# Patient Record
Sex: Female | Born: 1935
Health system: Southern US, Community
[De-identification: ages and names within clinical notes are randomized; demographics above are authoritative.]

## PROBLEM LIST (undated history)

## (undated) DIAGNOSIS — E7800 Pure hypercholesterolemia, unspecified: Secondary | ICD-10-CM

## (undated) DIAGNOSIS — I4891 Unspecified atrial fibrillation: Secondary | ICD-10-CM

## (undated) DIAGNOSIS — Z972 Presence of dental prosthetic device (complete) (partial): Secondary | ICD-10-CM

## (undated) DIAGNOSIS — C801 Malignant (primary) neoplasm, unspecified: Secondary | ICD-10-CM

## (undated) DIAGNOSIS — I10 Essential (primary) hypertension: Secondary | ICD-10-CM

## (undated) DIAGNOSIS — M1990 Unspecified osteoarthritis, unspecified site: Secondary | ICD-10-CM

## (undated) DIAGNOSIS — H409 Unspecified glaucoma: Secondary | ICD-10-CM

## (undated) DIAGNOSIS — IMO0001 Reserved for inherently not codable concepts without codable children: Secondary | ICD-10-CM

## (undated) HISTORY — PX: TONSILLECTOMY: SUR1361

## (undated) HISTORY — PX: ABDOMINAL HYSTERECTOMY: SHX81

## (undated) HISTORY — PX: COLONOSCOPY: SHX174

## (undated) HISTORY — PX: CARDIAC ELECTROPHYSIOLOGY STUDY AND ABLATION: SHX1294

## (undated) HISTORY — PX: CHOLECYSTECTOMY: SHX55

---

## 2004-12-19 ENCOUNTER — Ambulatory Visit: Payer: Self-pay | Admitting: Internal Medicine

## 2005-03-22 ENCOUNTER — Ambulatory Visit: Payer: Self-pay | Admitting: Internal Medicine

## 2005-04-04 ENCOUNTER — Ambulatory Visit: Payer: Self-pay | Admitting: Internal Medicine

## 2006-03-23 ENCOUNTER — Ambulatory Visit: Payer: Self-pay | Admitting: Internal Medicine

## 2007-03-26 ENCOUNTER — Ambulatory Visit: Payer: Self-pay | Admitting: Internal Medicine

## 2008-03-27 ENCOUNTER — Ambulatory Visit: Payer: Self-pay | Admitting: Internal Medicine

## 2009-03-29 ENCOUNTER — Ambulatory Visit: Payer: Self-pay | Admitting: Internal Medicine

## 2009-11-29 ENCOUNTER — Ambulatory Visit: Payer: Self-pay | Admitting: Internal Medicine

## 2010-03-31 ENCOUNTER — Ambulatory Visit: Payer: Self-pay | Admitting: Internal Medicine

## 2010-06-09 ENCOUNTER — Ambulatory Visit: Payer: Self-pay | Admitting: Internal Medicine

## 2011-05-22 ENCOUNTER — Ambulatory Visit: Payer: Self-pay | Admitting: Internal Medicine

## 2012-05-22 ENCOUNTER — Ambulatory Visit: Payer: Self-pay | Admitting: Internal Medicine

## 2012-07-03 HISTORY — PX: CARDIAC CATHETERIZATION: SHX172

## 2012-07-26 ENCOUNTER — Ambulatory Visit: Payer: Self-pay | Admitting: Cardiology

## 2012-09-26 ENCOUNTER — Ambulatory Visit: Payer: Self-pay | Admitting: Internal Medicine

## 2013-06-16 ENCOUNTER — Ambulatory Visit: Payer: Self-pay | Admitting: Internal Medicine

## 2014-08-31 DIAGNOSIS — H4011X1 Primary open-angle glaucoma, mild stage: Secondary | ICD-10-CM | POA: Diagnosis not present

## 2014-09-04 DIAGNOSIS — I481 Persistent atrial fibrillation: Secondary | ICD-10-CM | POA: Diagnosis not present

## 2014-09-04 DIAGNOSIS — E1121 Type 2 diabetes mellitus with diabetic nephropathy: Secondary | ICD-10-CM | POA: Diagnosis not present

## 2014-09-11 DIAGNOSIS — M1712 Unilateral primary osteoarthritis, left knee: Secondary | ICD-10-CM | POA: Diagnosis not present

## 2014-09-11 DIAGNOSIS — E042 Nontoxic multinodular goiter: Secondary | ICD-10-CM | POA: Diagnosis not present

## 2014-09-11 DIAGNOSIS — I10 Essential (primary) hypertension: Secondary | ICD-10-CM | POA: Diagnosis not present

## 2014-09-11 DIAGNOSIS — I481 Persistent atrial fibrillation: Secondary | ICD-10-CM | POA: Diagnosis not present

## 2014-10-22 DIAGNOSIS — D485 Neoplasm of uncertain behavior of skin: Secondary | ICD-10-CM | POA: Diagnosis not present

## 2014-10-22 DIAGNOSIS — L821 Other seborrheic keratosis: Secondary | ICD-10-CM | POA: Diagnosis not present

## 2014-10-22 DIAGNOSIS — L570 Actinic keratosis: Secondary | ICD-10-CM | POA: Diagnosis not present

## 2014-10-22 DIAGNOSIS — C44722 Squamous cell carcinoma of skin of right lower limb, including hip: Secondary | ICD-10-CM | POA: Diagnosis not present

## 2014-10-22 DIAGNOSIS — L578 Other skin changes due to chronic exposure to nonionizing radiation: Secondary | ICD-10-CM | POA: Diagnosis not present

## 2014-11-20 DIAGNOSIS — I5022 Chronic systolic (congestive) heart failure: Secondary | ICD-10-CM | POA: Diagnosis not present

## 2014-11-20 DIAGNOSIS — I10 Essential (primary) hypertension: Secondary | ICD-10-CM | POA: Diagnosis not present

## 2014-11-20 DIAGNOSIS — I4891 Unspecified atrial fibrillation: Secondary | ICD-10-CM | POA: Diagnosis not present

## 2014-11-20 DIAGNOSIS — E1121 Type 2 diabetes mellitus with diabetic nephropathy: Secondary | ICD-10-CM | POA: Diagnosis not present

## 2014-12-08 DIAGNOSIS — I481 Persistent atrial fibrillation: Secondary | ICD-10-CM | POA: Diagnosis not present

## 2014-12-08 DIAGNOSIS — E1121 Type 2 diabetes mellitus with diabetic nephropathy: Secondary | ICD-10-CM | POA: Diagnosis not present

## 2014-12-08 DIAGNOSIS — I10 Essential (primary) hypertension: Secondary | ICD-10-CM | POA: Diagnosis not present

## 2014-12-15 DIAGNOSIS — I5022 Chronic systolic (congestive) heart failure: Secondary | ICD-10-CM | POA: Diagnosis not present

## 2014-12-15 DIAGNOSIS — I481 Persistent atrial fibrillation: Secondary | ICD-10-CM | POA: Diagnosis not present

## 2014-12-15 DIAGNOSIS — I10 Essential (primary) hypertension: Secondary | ICD-10-CM | POA: Diagnosis not present

## 2014-12-15 DIAGNOSIS — M1990 Unspecified osteoarthritis, unspecified site: Secondary | ICD-10-CM | POA: Diagnosis not present

## 2014-12-30 DIAGNOSIS — I10 Essential (primary) hypertension: Secondary | ICD-10-CM | POA: Diagnosis not present

## 2014-12-30 DIAGNOSIS — E1121 Type 2 diabetes mellitus with diabetic nephropathy: Secondary | ICD-10-CM | POA: Diagnosis not present

## 2014-12-30 DIAGNOSIS — E042 Nontoxic multinodular goiter: Secondary | ICD-10-CM | POA: Diagnosis not present

## 2014-12-30 DIAGNOSIS — I481 Persistent atrial fibrillation: Secondary | ICD-10-CM | POA: Diagnosis not present

## 2015-01-21 DIAGNOSIS — L570 Actinic keratosis: Secondary | ICD-10-CM | POA: Diagnosis not present

## 2015-01-21 DIAGNOSIS — L821 Other seborrheic keratosis: Secondary | ICD-10-CM | POA: Diagnosis not present

## 2015-01-21 DIAGNOSIS — C44722 Squamous cell carcinoma of skin of right lower limb, including hip: Secondary | ICD-10-CM | POA: Diagnosis not present

## 2015-01-21 DIAGNOSIS — D485 Neoplasm of uncertain behavior of skin: Secondary | ICD-10-CM | POA: Diagnosis not present

## 2015-01-21 DIAGNOSIS — L578 Other skin changes due to chronic exposure to nonionizing radiation: Secondary | ICD-10-CM | POA: Diagnosis not present

## 2015-01-21 DIAGNOSIS — L820 Inflamed seborrheic keratosis: Secondary | ICD-10-CM | POA: Diagnosis not present

## 2015-03-01 DIAGNOSIS — H4011X1 Primary open-angle glaucoma, mild stage: Secondary | ICD-10-CM | POA: Diagnosis not present

## 2015-03-09 DIAGNOSIS — H4011X1 Primary open-angle glaucoma, mild stage: Secondary | ICD-10-CM | POA: Diagnosis not present

## 2015-03-11 DIAGNOSIS — H2513 Age-related nuclear cataract, bilateral: Secondary | ICD-10-CM | POA: Diagnosis not present

## 2015-03-15 ENCOUNTER — Encounter: Payer: Self-pay | Admitting: *Deleted

## 2015-03-15 NOTE — Discharge Instructions (Signed)

## 2015-03-17 ENCOUNTER — Ambulatory Visit: Payer: Commercial Managed Care - HMO | Admitting: Anesthesiology

## 2015-03-17 ENCOUNTER — Ambulatory Visit
Admission: RE | Admit: 2015-03-17 | Discharge: 2015-03-17 | Disposition: A | Discharge status: Home / Self Care | Payer: Commercial Managed Care - HMO | Source: Ambulatory Visit | Attending: Ophthalmology | Admitting: Ophthalmology

## 2015-03-17 ENCOUNTER — Encounter
Admission: RE | Disposition: A | Discharge status: Home / Self Care | Payer: Self-pay | Source: Ambulatory Visit | Attending: Ophthalmology

## 2015-03-17 DIAGNOSIS — E780 Pure hypercholesterolemia: Secondary | ICD-10-CM | POA: Insufficient documentation

## 2015-03-17 DIAGNOSIS — H2513 Age-related nuclear cataract, bilateral: Secondary | ICD-10-CM | POA: Diagnosis not present

## 2015-03-17 DIAGNOSIS — Z85828 Personal history of other malignant neoplasm of skin: Secondary | ICD-10-CM | POA: Diagnosis not present

## 2015-03-17 DIAGNOSIS — I499 Cardiac arrhythmia, unspecified: Secondary | ICD-10-CM | POA: Diagnosis not present

## 2015-03-17 DIAGNOSIS — H2512 Age-related nuclear cataract, left eye: Secondary | ICD-10-CM | POA: Diagnosis not present

## 2015-03-17 DIAGNOSIS — Z9049 Acquired absence of other specified parts of digestive tract: Secondary | ICD-10-CM | POA: Insufficient documentation

## 2015-03-17 DIAGNOSIS — Z90710 Acquired absence of both cervix and uterus: Secondary | ICD-10-CM | POA: Diagnosis not present

## 2015-03-17 DIAGNOSIS — I10 Essential (primary) hypertension: Secondary | ICD-10-CM | POA: Diagnosis not present

## 2015-03-17 HISTORY — DX: Essential (primary) hypertension: I10

## 2015-03-17 HISTORY — DX: Pure hypercholesterolemia, unspecified: E78.00

## 2015-03-17 HISTORY — DX: Unspecified osteoarthritis, unspecified site: M19.90

## 2015-03-17 HISTORY — DX: Unspecified glaucoma: H40.9

## 2015-03-17 HISTORY — DX: Presence of dental prosthetic device (complete) (partial): Z97.2

## 2015-03-17 HISTORY — DX: Unspecified atrial fibrillation: I48.91

## 2015-03-17 HISTORY — PX: CATARACT EXTRACTION W/PHACO: SHX586

## 2015-03-17 SURGERY — PHACOEMULSIFICATION, CATARACT, WITH IOL INSERTION
Anesthesia: General | Laterality: Left | Wound class: Clean

## 2015-03-17 MED ORDER — PROPARACAINE HCL 0.5 % OP SOLN
1.0000 [drp] | Freq: Once | OPHTHALMIC | Status: AC
  Administered 2015-03-17: 1 [drp] via OPHTHALMIC

## 2015-03-17 MED ORDER — FENTANYL CITRATE (PF) 100 MCG/2ML IJ SOLN
INTRAMUSCULAR | Status: DC | PRN
  Administered 2015-03-17: 50 ug via INTRAVENOUS

## 2015-03-17 MED ORDER — EPINEPHRINE HCL 1 MG/ML IJ SOLN
INTRAOCULAR | Status: DC | PRN
  Administered 2015-03-17: 102 mL via OPHTHALMIC

## 2015-03-17 MED ORDER — LACTATED RINGERS IV SOLN: 500.0000 mL | INTRAVENOUS | Status: DC

## 2015-03-17 MED ORDER — BRIMONIDINE TARTRATE 0.2 % OP SOLN
OPHTHALMIC | Status: DC | PRN
  Administered 2015-03-17: 1 [drp] via OPHTHALMIC

## 2015-03-17 MED ORDER — TIMOLOL MALEATE 0.5 % OP SOLN
OPHTHALMIC | Status: DC | PRN
  Administered 2015-03-17: 1 [drp] via OPHTHALMIC

## 2015-03-17 MED ORDER — OXYCODONE HCL 5 MG/5ML PO SOLN: 5.0000 mg | Freq: Once | ORAL | Status: DC | PRN

## 2015-03-17 MED ORDER — NA HYALUR & NA CHOND-NA HYALUR 0.4-0.35 ML IO KIT
PACK | INTRAOCULAR | Status: DC | PRN
  Administered 2015-03-17: 1 mL via INTRAOCULAR

## 2015-03-17 MED ORDER — CEFUROXIME OPHTHALMIC INJECTION 1 MG/0.1 ML
INJECTION | OPHTHALMIC | Status: DC | PRN
  Administered 2015-03-17: 0.1 mL via INTRACAMERAL

## 2015-03-17 MED ORDER — LACTATED RINGERS IV SOLN: INTRAVENOUS | Status: DC

## 2015-03-17 MED ORDER — ACETAMINOPHEN 325 MG PO TABS: 325.0000 mg | ORAL_TABLET | ORAL | Status: DC | PRN

## 2015-03-17 MED ORDER — OXYCODONE HCL 5 MG PO TABS: 5.0000 mg | ORAL_TABLET | Freq: Once | ORAL | Status: DC | PRN

## 2015-03-17 MED ORDER — TETRACAINE HCL 0.5 % OP SOLN
OPHTHALMIC | Status: DC | PRN
  Administered 2015-03-17: 2 [drp] via OPHTHALMIC

## 2015-03-17 MED ORDER — POVIDONE-IODINE 5 % OP SOLN
1.0000 "application " | OPHTHALMIC | Status: DC | PRN
  Administered 2015-03-17: 1 via OPHTHALMIC

## 2015-03-17 MED ORDER — ACETAMINOPHEN 160 MG/5ML PO SOLN: 325.0000 mg | ORAL | Status: DC | PRN

## 2015-03-17 MED ORDER — FENTANYL CITRATE (PF) 100 MCG/2ML IJ SOLN: INTRAMUSCULAR | Status: DC | PRN

## 2015-03-17 MED ORDER — ARMC OPHTHALMIC DILATING GEL
1.0000 "application " | OPHTHALMIC | Status: DC | PRN
  Administered 2015-03-17: 1 via OPHTHALMIC

## 2015-03-17 MED ORDER — MIDAZOLAM HCL 2 MG/2ML IJ SOLN
INTRAMUSCULAR | Status: DC | PRN
  Administered 2015-03-17: 2 mg via INTRAVENOUS

## 2015-03-17 MED ORDER — FENTANYL CITRATE (PF) 100 MCG/2ML IJ SOLN: 25.0000 ug | INTRAMUSCULAR | Status: DC | PRN

## 2015-03-17 MED ORDER — DEXAMETHASONE SODIUM PHOSPHATE 4 MG/ML IJ SOLN: 8.0000 mg | Freq: Once | INTRAMUSCULAR | Status: DC | PRN

## 2015-03-17 MED ORDER — MIDAZOLAM HCL 2 MG/2ML IJ SOLN: INTRAMUSCULAR | Status: DC | PRN

## 2015-03-17 SURGICAL SUPPLY — 26 items
CANNULA ANT/CHMB 27GA (MISCELLANEOUS) ×3 IMPLANT
GLOVE SURG LX 7.5 STRW (GLOVE) ×4
GLOVE SURG LX STRL 7.5 STRW (GLOVE) ×2 IMPLANT
GLOVE SURG TRIUMPH 8.0 PF LTX (GLOVE) ×3 IMPLANT
GOWN STRL REUS W/ TWL LRG LVL3 (GOWN DISPOSABLE) ×2 IMPLANT
GOWN STRL REUS W/TWL LRG LVL3 (GOWN DISPOSABLE) ×6
LENS IOL TECNIS 23.0 (Intraocular Lens) ×3 IMPLANT
LENS IOL TECNIS MONO 1P 23.0 (Intraocular Lens) ×1 IMPLANT
MARKER SKIN SURG W/RULER VIO (MISCELLANEOUS) ×3 IMPLANT
NDL RETROBULBAR .5 NSTRL (NEEDLE) IMPLANT
NEEDLE FILTER BLUNT 18X 1/2SAF (NEEDLE) ×2
NEEDLE FILTER BLUNT 18X1 1/2 (NEEDLE) ×1 IMPLANT
PACK CATARACT BRASINGTON (MISCELLANEOUS) ×3 IMPLANT
PACK EYE AFTER SURG (MISCELLANEOUS) ×3 IMPLANT
PACK OPTHALMIC (MISCELLANEOUS) ×3 IMPLANT
RING MALYGIN 7.0 (MISCELLANEOUS) IMPLANT
SUT ETHILON 10-0 CS-B-6CS-B-6 (SUTURE)
SUT VICRYL  9 0 (SUTURE)
SUT VICRYL 9 0 (SUTURE) IMPLANT
SUTURE EHLN 10-0 CS-B-6CS-B-6 (SUTURE) IMPLANT
SYR 3ML LL SCALE MARK (SYRINGE) ×3 IMPLANT
SYR 5ML LL (SYRINGE) IMPLANT
SYR TB 1ML LUER SLIP (SYRINGE) ×3 IMPLANT
WATER STERILE IRR 250ML POUR (IV SOLUTION) ×3 IMPLANT
WATER STERILE IRR 500ML POUR (IV SOLUTION) ×3 IMPLANT
WIPE NON LINTING 3.25X3.25 (MISCELLANEOUS) ×3 IMPLANT

## 2015-03-17 NOTE — Anesthesia Procedure Notes (Signed)
Procedure Name: MAC Performed by: Yasmine Kilbourne Pre-anesthesia Checklist: Patient identified, Emergency Drugs available, Suction available, Timeout performed and Patient being monitored Patient Re-evaluated:Patient Re-evaluated prior to inductionOxygen Delivery Method: Nasal cannula Placement Confirmation: positive ETCO2     

## 2015-03-17 NOTE — H&P (Signed)
  The History and Physical notes were scanned in.  The patient remains stable and unchanged from the H&P.   Previous H&P reviewed, patient examined, and there are no changes.  Autumn Bradley 03/17/2015 10:24 AM

## 2015-03-17 NOTE — Op Note (Signed)
OPERATIVE NOTE  Autumn Bradley [id number] 03/17/2015   PREOPERATIVE DIAGNOSIS:  Nuclear sclerotic cataract left eye. H25.12   POSTOPERATIVE DIAGNOSIS:    Nuclear sclerotic cataract left eye.     PROCEDURE:  Phacoemusification with posterior chamber intraocular lens placement of the left eye   LENS:   Implant Name Type Inv. Item Serial No. Manufacturer Lot No. LRB No. Used  LENS IMPL INTRAOC ZCB00 23.0 - [id number] Intraocular Lens LENS IMPL INTRAOC ZCB00 23.0 [id number] AMO   Left 1        ULTRASOUND TIME: 21  % of 1 minutes 58 seconds, CDE 25.3  SURGEON:  Wyonia Hough, MD   ANESTHESIA:  Topical with tetracaine drops and 2% Xylocaine jelly.   COMPLICATIONS:  None.   DESCRIPTION OF PROCEDURE:  The patient was identified in the holding room and transported to the operating room and placed in the supine position under the operating microscope.  The left eye was identified as the operative eye and it was prepped and draped in the usual sterile ophthalmic fashion.   A 1 millimeter clear-corneal paracentesis was made at the 1:30 position.  The anterior chamber was filled with Viscoat viscoelastic.  A 2.4 millimeter keratome was used to make a near-clear corneal incision at the 10:30 position.  .  A curvilinear capsulorrhexis was made with a cystotome and capsulorrhexis forceps.  Balanced salt solution was used to hydrodissect and hydrodelineate the nucleus.   Phacoemulsification was then used in stop and chop fashion to remove the lens nucleus and epinucleus.  The remaining cortex was then removed using the irrigation and aspiration handpiece. Provisc was then placed into the capsular bag to distend it for lens placement.  A lens was then injected into the capsular bag.  The remaining viscoelastic was aspirated.   Wounds were hydrated with balanced salt solution.  The anterior chamber was inflated to a physiologic pressure with balanced salt solution.  No wound leaks were noted.  Cefuroxime 0.1 ml of a 10mg /ml solution was injected into the anterior chamber for a dose of 1 mg of intracameral antibiotic at the completion of the case.   Timolol and Brimonidine drops were applied to the eye.  The patient was taken to the recovery room in stable condition without complications of anesthesia or surgery.  Autumn Bradley 03/17/2015, 11:16 AM

## 2015-03-17 NOTE — Anesthesia Preprocedure Evaluation (Addendum)
Anesthesia Evaluation  Patient identified by MRN, date of birth, ID band Patient awake    Reviewed: Allergy & Precautions, H&P , NPO status , Patient's Chart, lab work & pertinent test results, reviewed documented beta blocker date and time   Airway Mallampati: II  TM Distance: >3 FB Neck ROM: full    Dental  (+) Upper Dentures, Lower Dentures   Pulmonary neg pulmonary ROS,    Pulmonary exam normal breath sounds clear to auscultation       Cardiovascular Exercise Tolerance: Good hypertension,  Rhythm:regular Rate:Normal     Neuro/Psych negative neurological ROS  negative psych ROS   GI/Hepatic negative GI ROS, Neg liver ROS,   Endo/Other  negative endocrine ROS  Renal/GU negative Renal ROS  negative genitourinary   Musculoskeletal   Abdominal   Peds  Hematology negative hematology ROS (+)   Anesthesia Other Findings   Reproductive/Obstetrics negative OB ROS                            Anesthesia Physical Anesthesia Plan  ASA: II  Anesthesia Plan: General   Post-op Pain Management:    Induction:   Airway Management Planned:   Additional Equipment:   Intra-op Plan:   Post-operative Plan:   Informed Consent: I have reviewed the patients History and Physical, chart, labs and discussed the procedure including the risks, benefits and alternatives for the proposed anesthesia with the patient or authorized representative who has indicated his/her understanding and acceptance.     Plan Discussed with: CRNA  Anesthesia Plan Comments:         Anesthesia Quick Evaluation

## 2015-03-17 NOTE — Anesthesia Postprocedure Evaluation (Signed)
  Anesthesia Post-op Note  Patient: Autumn Bradley  Procedure(s) Performed: Procedure(s): CATARACT EXTRACTION PHACO AND INTRAOCULAR LENS PLACEMENT (IOC) (Left)  Anesthesia type:General  Patient location: PACU  Post pain: Pain level controlled  Post assessment: Post-op Vital signs reviewed, Patient's Cardiovascular Status Stable, Respiratory Function Stable, Patent Airway and No signs of Nausea or vomiting  Post vital signs: Reviewed and stable  Last Vitals:  Filed Vitals:   03/17/15 1127  BP: 114/74  Pulse: 69  Temp:   Resp: 14    Level of consciousness: awake, alert  and patient cooperative  Complications: No apparent anesthesia complications

## 2015-03-17 NOTE — Transfer of Care (Signed)
Immediate Anesthesia Transfer of Care Note  Patient: Autumn Bradley  Procedure(s) Performed: Procedure(s): CATARACT EXTRACTION PHACO AND INTRAOCULAR LENS PLACEMENT (IOC) (Left)  Patient Location: PACU  Anesthesia Type: General  Level of Consciousness: awake, alert  and patient cooperative  Airway and Oxygen Therapy: Patient Spontanous Breathing and Patient connected to supplemental oxygen  Post-op Assessment: Post-op Vital signs reviewed, Patient's Cardiovascular Status Stable, Respiratory Function Stable, Patent Airway and No signs of Nausea or vomiting  Post-op Vital Signs: Reviewed and stable  Complications: No apparent anesthesia complications

## 2015-03-18 ENCOUNTER — Encounter: Payer: Self-pay | Admitting: Ophthalmology

## 2015-03-25 DIAGNOSIS — Z85828 Personal history of other malignant neoplasm of skin: Secondary | ICD-10-CM | POA: Diagnosis not present

## 2015-03-25 DIAGNOSIS — L820 Inflamed seborrheic keratosis: Secondary | ICD-10-CM | POA: Diagnosis not present

## 2015-03-25 DIAGNOSIS — L570 Actinic keratosis: Secondary | ICD-10-CM | POA: Diagnosis not present

## 2015-03-25 DIAGNOSIS — L578 Other skin changes due to chronic exposure to nonionizing radiation: Secondary | ICD-10-CM | POA: Diagnosis not present

## 2015-04-06 DIAGNOSIS — H2511 Age-related nuclear cataract, right eye: Secondary | ICD-10-CM | POA: Diagnosis not present

## 2015-04-12 NOTE — Discharge Instructions (Signed)

## 2015-04-14 ENCOUNTER — Ambulatory Visit: Payer: Commercial Managed Care - HMO | Admitting: Anesthesiology

## 2015-04-14 ENCOUNTER — Encounter
Admission: RE | Disposition: A | Discharge status: Home / Self Care | Payer: Self-pay | Source: Ambulatory Visit | Attending: Ophthalmology

## 2015-04-14 ENCOUNTER — Ambulatory Visit
Admission: RE | Admit: 2015-04-14 | Discharge: 2015-04-14 | Disposition: A | Discharge status: Home / Self Care | Payer: Commercial Managed Care - HMO | Source: Ambulatory Visit | Attending: Ophthalmology | Admitting: Ophthalmology

## 2015-04-14 DIAGNOSIS — I4891 Unspecified atrial fibrillation: Secondary | ICD-10-CM | POA: Diagnosis not present

## 2015-04-14 DIAGNOSIS — E7800 Pure hypercholesterolemia, unspecified: Secondary | ICD-10-CM | POA: Insufficient documentation

## 2015-04-14 DIAGNOSIS — I10 Essential (primary) hypertension: Secondary | ICD-10-CM | POA: Insufficient documentation

## 2015-04-14 DIAGNOSIS — Z85828 Personal history of other malignant neoplasm of skin: Secondary | ICD-10-CM | POA: Insufficient documentation

## 2015-04-14 DIAGNOSIS — H2511 Age-related nuclear cataract, right eye: Secondary | ICD-10-CM | POA: Diagnosis not present

## 2015-04-14 HISTORY — DX: Malignant (primary) neoplasm, unspecified: C80.1

## 2015-04-14 HISTORY — PX: CATARACT EXTRACTION W/PHACO: SHX586

## 2015-04-14 HISTORY — DX: Reserved for inherently not codable concepts without codable children: IMO0001

## 2015-04-14 SURGERY — PHACOEMULSIFICATION, CATARACT, WITH IOL INSERTION
Anesthesia: Monitor Anesthesia Care | Laterality: Right | Wound class: Clean

## 2015-04-14 MED ORDER — TETRACAINE HCL 0.5 % OP SOLN
OPHTHALMIC | Status: DC | PRN
Start: 2015-04-14 — End: 2015-04-14
  Administered 2015-04-14: 10 [drp] via OPHTHALMIC

## 2015-04-14 MED ORDER — ERYTHROMYCIN 5 MG/GM OP OINT
TOPICAL_OINTMENT | OPHTHALMIC | Status: DC | PRN
  Administered 2015-04-14: 1 via OPHTHALMIC

## 2015-04-14 MED ORDER — ACETAMINOPHEN 325 MG PO TABS: 325.0000 mg | ORAL_TABLET | ORAL | Status: DC | PRN

## 2015-04-14 MED ORDER — MIDAZOLAM HCL 2 MG/2ML IJ SOLN
INTRAMUSCULAR | Status: DC | PRN
  Administered 2015-04-14: 1.5 mg via INTRAVENOUS
  Administered 2015-04-14: .5 mg via INTRAVENOUS

## 2015-04-14 MED ORDER — CEFUROXIME OPHTHALMIC INJECTION 1 MG/0.1 ML
INJECTION | OPHTHALMIC | Status: DC | PRN
  Administered 2015-04-14: 0.1 mL via INTRACAMERAL

## 2015-04-14 MED ORDER — POVIDONE-IODINE 5 % OP SOLN
1.0000 "application " | OPHTHALMIC | Status: DC | PRN
  Administered 2015-04-14: 1 via OPHTHALMIC

## 2015-04-14 MED ORDER — FENTANYL CITRATE (PF) 100 MCG/2ML IJ SOLN
INTRAMUSCULAR | Status: DC | PRN
  Administered 2015-04-14: 50 ug via INTRAVENOUS
  Administered 2015-04-14: 25 ug via INTRAVENOUS

## 2015-04-14 MED ORDER — LACTATED RINGERS IV SOLN: INTRAVENOUS | Status: DC

## 2015-04-14 MED ORDER — BRIMONIDINE TARTRATE 0.2 % OP SOLN
OPHTHALMIC | Status: DC | PRN
  Administered 2015-04-14: 1 [drp] via OPHTHALMIC

## 2015-04-14 MED ORDER — ARMC OPHTHALMIC DILATING GEL
1.0000 "application " | OPHTHALMIC | Status: DC | PRN
  Administered 2015-04-14 (×2): 1 via OPHTHALMIC

## 2015-04-14 MED ORDER — LIDOCAINE HCL (PF) 4 % IJ SOLN
INTRAOCULAR | Status: DC | PRN
  Administered 2015-04-14: 1 mL via OPHTHALMIC

## 2015-04-14 MED ORDER — TIMOLOL MALEATE 0.5 % OP SOLN
OPHTHALMIC | Status: DC | PRN
  Administered 2015-04-14: 1 [drp] via OPHTHALMIC

## 2015-04-14 MED ORDER — NA HYALUR & NA CHOND-NA HYALUR 0.4-0.35 ML IO KIT
PACK | INTRAOCULAR | Status: DC | PRN
  Administered 2015-04-14: 1 mL via INTRAOCULAR

## 2015-04-14 MED ORDER — EPINEPHRINE HCL 1 MG/ML IJ SOLN
INTRAMUSCULAR | Status: DC | PRN
  Administered 2015-04-14: 12:00:00 via OPHTHALMIC

## 2015-04-14 MED ORDER — TETRACAINE HCL 0.5 % OP SOLN
1.0000 [drp] | OPHTHALMIC | Status: DC | PRN
  Administered 2015-04-14: 1 [drp] via OPHTHALMIC

## 2015-04-14 MED ORDER — ACETAMINOPHEN 160 MG/5ML PO SOLN: 325.0000 mg | ORAL | Status: DC | PRN

## 2015-04-14 SURGICAL SUPPLY — 26 items
CANNULA ANT/CHMB 27GA (MISCELLANEOUS) ×3 IMPLANT
GLOVE SURG LX 7.5 STRW (GLOVE) ×2
GLOVE SURG LX STRL 7.5 STRW (GLOVE) ×1 IMPLANT
GLOVE SURG TRIUMPH 8.0 PF LTX (GLOVE) ×3 IMPLANT
GOWN STRL REUS W/ TWL LRG LVL3 (GOWN DISPOSABLE) ×2 IMPLANT
GOWN STRL REUS W/TWL LRG LVL3 (GOWN DISPOSABLE) ×6
LENS IOL TECNIS 23.0 (Intraocular Lens) ×3 IMPLANT
LENS IOL TECNIS MONO 1P 23.0 (Intraocular Lens) ×1 IMPLANT
MARKER SKIN SURG W/RULER VIO (MISCELLANEOUS) ×3 IMPLANT
NDL RETROBULBAR .5 NSTRL (NEEDLE) IMPLANT
NEEDLE FILTER BLUNT 18X 1/2SAF (NEEDLE) ×4
NEEDLE FILTER BLUNT 18X1 1/2 (NEEDLE) ×2 IMPLANT
PACK CATARACT BRASINGTON (MISCELLANEOUS) ×3 IMPLANT
PACK EYE AFTER SURG (MISCELLANEOUS) ×3 IMPLANT
PACK OPTHALMIC (MISCELLANEOUS) ×3 IMPLANT
RING MALYGIN 7.0 (MISCELLANEOUS) IMPLANT
SUT ETHILON 10-0 CS-B-6CS-B-6 (SUTURE)
SUT VICRYL  9 0 (SUTURE)
SUT VICRYL 9 0 (SUTURE) IMPLANT
SUTURE EHLN 10-0 CS-B-6CS-B-6 (SUTURE) IMPLANT
SYR 3ML LL SCALE MARK (SYRINGE) ×6 IMPLANT
SYR 5ML LL (SYRINGE) IMPLANT
SYR TB 1ML LUER SLIP (SYRINGE) ×3 IMPLANT
WATER STERILE IRR 250ML POUR (IV SOLUTION) ×3 IMPLANT
WATER STERILE IRR 500ML POUR (IV SOLUTION) IMPLANT
WIPE NON LINTING 3.25X3.25 (MISCELLANEOUS) ×3 IMPLANT

## 2015-04-14 NOTE — H&P (Signed)
  The History and Physical notes are on paper, have been signed, and are to be scanned. The patient remains stable and unchanged from the H&P.   Previous H&P reviewed, patient examined, and there are no changes.  Autumn Bradley 04/14/2015 10:42 AM

## 2015-04-14 NOTE — Anesthesia Postprocedure Evaluation (Signed)
  Anesthesia Post-op Note  Patient: Autumn Bradley  Procedure(s) Performed: Procedure(s): CATARACT EXTRACTION PHACO AND INTRAOCULAR LENS PLACEMENT (IOC) (Right)  Anesthesia type:MAC  Patient location: PACU  Post pain: Pain level controlled  Post assessment: Post-op Vital signs reviewed, Patient's Cardiovascular Status Stable, Respiratory Function Stable, Patent Airway and No signs of Nausea or vomiting  Post vital signs: Reviewed and stable  Last Vitals:  Filed Vitals:   04/14/15 1156  BP:   Pulse: 92  Temp:   Resp: 16    Level of consciousness: awake, alert  and patient cooperative  Complications: No apparent anesthesia complications  PT had isolated 5 beat run of either V-tach or RVR.  Resolved spontaneously, and pt remained stable with no symptoms.  Contacted Dr. Ubaldo Glassing, Pt's Cardiologist, who recommended pt come to see him within the next few days, but had no concerns about her returning home today.  Sent Pt home with copy of rhythm strip to bring with her to Dr. Ubaldo Glassing, and instructions to contact Dr. Bethanne Ginger office for f/u.

## 2015-04-14 NOTE — Transfer of Care (Signed)
Immediate Anesthesia Transfer of Care Note  Patient: Autumn Bradley  Procedure(s) Performed: Procedure(s): CATARACT EXTRACTION PHACO AND INTRAOCULAR LENS PLACEMENT (IOC) (Right)  Patient Location: PACU  Anesthesia Type: MAC  Level of Consciousness: awake, alert  and patient cooperative  Airway and Oxygen Therapy: Patient Spontanous Breathing and Patient connected to supplemental oxygen  Post-op Assessment: Post-op Vital signs reviewed, Patient's Cardiovascular Status Stable, Respiratory Function Stable, Patent Airway and No signs of Nausea or vomiting  Post-op Vital Signs: Reviewed and stable  Complications: No apparent anesthesia complications

## 2015-04-14 NOTE — Anesthesia Preprocedure Evaluation (Addendum)
Anesthesia Evaluation  Patient identified by MRN, date of birth, ID band Patient awake    Reviewed: Allergy & Precautions, H&P , NPO status , Patient's Chart, lab work & pertinent test results, reviewed documented beta blocker date and time   Airway Mallampati: II  TM Distance: >3 FB Neck ROM: full    Dental  (+) Upper Dentures, Lower Dentures   Pulmonary neg pulmonary ROS,    Pulmonary exam normal breath sounds clear to auscultation       Cardiovascular Exercise Tolerance: Good hypertension, + dysrhythmias Atrial Fibrillation  Rhythm:irregular Rate:Abnormal     Neuro/Psych negative neurological ROS  negative psych ROS   GI/Hepatic negative GI ROS, Neg liver ROS,   Endo/Other  negative endocrine ROS  Renal/GU negative Renal ROS  negative genitourinary   Musculoskeletal   Abdominal   Peds  Hematology negative hematology ROS (+)   Anesthesia Other Findings   Reproductive/Obstetrics negative OB ROS                            Anesthesia Physical  Anesthesia Plan  ASA: II  Anesthesia Plan: MAC   Post-op Pain Management:    Induction:   Airway Management Planned:   Additional Equipment:   Intra-op Plan:   Post-operative Plan:   Informed Consent: I have reviewed the patients History and Physical, chart, labs and discussed the procedure including the risks, benefits and alternatives for the proposed anesthesia with the patient or authorized representative who has indicated his/her understanding and acceptance.     Plan Discussed with: CRNA  Anesthesia Plan Comments:         Anesthesia Quick Evaluation

## 2015-04-14 NOTE — Op Note (Signed)
LOCATION:  [facility]   PREOPERATIVE DIAGNOSIS:    Nuclear sclerotic cataract right eye. H25.11   POSTOPERATIVE DIAGNOSIS:  Nuclear sclerotic cataract right eye.     PROCEDURE:  Phacoemusification with posterior chamber intraocular lens placement of the right eye   LENS:   Implant Name Type Inv. Item Serial No. Manufacturer Lot No. LRB No. Used  LENS IMPL INTRAOC ZCB00 23.0 - [id number] Intraocular Lens LENS IMPL INTRAOC ZCB00 23.0 [id number] AMO   Right 1        ULTRASOUND TIME: 20 % of 1 minutes, 47 seconds.  CDE 22.0   SURGEON:  Wyonia Hough, MD   ANESTHESIA:  Topical with tetracaine drops and 2% Xylocaine jelly, augmented with 1% preservative-free intracameral lidocaine.    COMPLICATIONS:  None.   DESCRIPTION OF PROCEDURE:  The patient was identified in the holding room and transported to the operating room and placed in the supine position under the operating microscope.  The right eye was identified as the operative eye and it was prepped and draped in the usual sterile ophthalmic fashion.   A 1 millimeter clear-corneal paracentesis was made at the 12:00 position.  0.5 ml of preservative-free 1% lidocaine was injected into the anterior chamber. The anterior chamber was filled with Viscoat viscoelastic.  A 2.4 millimeter keratome was used to make a near-clear corneal incision at the 9:00 position.  A curvilinear capsulorrhexis was made with a cystotome and capsulorrhexis forceps.  Balanced salt solution was used to hydrodissect and hydrodelineate the nucleus.   Phacoemulsification was then used in stop and chop fashion to remove the lens nucleus and epinucleus.  The remaining cortex was then removed using the irrigation and aspiration handpiece. Provisc was then placed into the capsular bag to distend it for lens placement.  A lens was then injected into the capsular bag.  The remaining viscoelastic was aspirated.   Wounds were hydrated with balanced salt  solution.  The anterior chamber was inflated to a physiologic pressure with balanced salt solution.  No wound leaks were noted. Cefuroxime 0.1 ml of a 10mg /ml solution was injected into the anterior chamber for a dose of 1 mg of intracameral antibiotic at the completion of the case.   Timolol and Brimonidine drops were applied to the eye.  The patient was taken to the recovery room in stable condition without complications of anesthesia or surgery.   Ladaisha Portillo 04/14/2015, 11:44 AM

## 2015-04-14 NOTE — Anesthesia Procedure Notes (Signed)
Procedure Name: MAC Performed by: Karim Aiello Pre-anesthesia Checklist: Patient identified, Emergency Drugs available, Suction available, Timeout performed and Patient being monitored Patient Re-evaluated:Patient Re-evaluated prior to inductionOxygen Delivery Method: Nasal cannula Placement Confirmation: positive ETCO2       

## 2015-04-15 ENCOUNTER — Encounter: Payer: Self-pay | Admitting: Ophthalmology

## 2015-04-19 DIAGNOSIS — I5022 Chronic systolic (congestive) heart failure: Secondary | ICD-10-CM | POA: Diagnosis not present

## 2015-04-19 DIAGNOSIS — I481 Persistent atrial fibrillation: Secondary | ICD-10-CM | POA: Diagnosis not present

## 2015-04-19 DIAGNOSIS — I10 Essential (primary) hypertension: Secondary | ICD-10-CM | POA: Diagnosis not present

## 2015-04-19 DIAGNOSIS — I480 Paroxysmal atrial fibrillation: Secondary | ICD-10-CM | POA: Diagnosis not present

## 2015-04-19 DIAGNOSIS — M23207 Derangement of unspecified meniscus due to old tear or injury, left knee: Secondary | ICD-10-CM | POA: Diagnosis not present

## 2015-04-22 DIAGNOSIS — Z87898 Personal history of other specified conditions: Secondary | ICD-10-CM | POA: Diagnosis not present

## 2015-05-21 DIAGNOSIS — Z961 Presence of intraocular lens: Secondary | ICD-10-CM | POA: Diagnosis not present

## 2015-06-11 DIAGNOSIS — I481 Persistent atrial fibrillation: Secondary | ICD-10-CM | POA: Diagnosis not present

## 2015-06-11 DIAGNOSIS — I10 Essential (primary) hypertension: Secondary | ICD-10-CM | POA: Diagnosis not present

## 2015-06-11 DIAGNOSIS — E1121 Type 2 diabetes mellitus with diabetic nephropathy: Secondary | ICD-10-CM | POA: Diagnosis not present

## 2015-06-11 DIAGNOSIS — I5022 Chronic systolic (congestive) heart failure: Secondary | ICD-10-CM | POA: Diagnosis not present

## 2015-06-18 DIAGNOSIS — I10 Essential (primary) hypertension: Secondary | ICD-10-CM | POA: Diagnosis not present

## 2015-06-18 DIAGNOSIS — I481 Persistent atrial fibrillation: Secondary | ICD-10-CM | POA: Diagnosis not present

## 2015-06-18 DIAGNOSIS — E1121 Type 2 diabetes mellitus with diabetic nephropathy: Secondary | ICD-10-CM | POA: Diagnosis not present

## 2015-06-18 DIAGNOSIS — M1990 Unspecified osteoarthritis, unspecified site: Secondary | ICD-10-CM | POA: Diagnosis not present

## 2015-06-18 DIAGNOSIS — E042 Nontoxic multinodular goiter: Secondary | ICD-10-CM | POA: Diagnosis not present

## 2015-06-18 DIAGNOSIS — N183 Chronic kidney disease, stage 3 (moderate): Secondary | ICD-10-CM | POA: Diagnosis not present

## 2015-06-18 DIAGNOSIS — I5022 Chronic systolic (congestive) heart failure: Secondary | ICD-10-CM | POA: Diagnosis not present

## 2015-10-27 DIAGNOSIS — I5032 Chronic diastolic (congestive) heart failure: Secondary | ICD-10-CM | POA: Diagnosis not present

## 2015-10-27 DIAGNOSIS — I482 Chronic atrial fibrillation: Secondary | ICD-10-CM | POA: Diagnosis not present

## 2015-10-27 DIAGNOSIS — I4891 Unspecified atrial fibrillation: Secondary | ICD-10-CM | POA: Diagnosis not present

## 2015-10-27 DIAGNOSIS — M1990 Unspecified osteoarthritis, unspecified site: Secondary | ICD-10-CM | POA: Diagnosis not present

## 2015-10-27 DIAGNOSIS — I5022 Chronic systolic (congestive) heart failure: Secondary | ICD-10-CM | POA: Diagnosis not present

## 2015-10-27 DIAGNOSIS — I10 Essential (primary) hypertension: Secondary | ICD-10-CM | POA: Diagnosis not present

## 2015-11-19 DIAGNOSIS — H40003 Preglaucoma, unspecified, bilateral: Secondary | ICD-10-CM | POA: Diagnosis not present

## 2015-12-13 DIAGNOSIS — E1121 Type 2 diabetes mellitus with diabetic nephropathy: Secondary | ICD-10-CM | POA: Diagnosis not present

## 2015-12-13 DIAGNOSIS — I481 Persistent atrial fibrillation: Secondary | ICD-10-CM | POA: Diagnosis not present

## 2015-12-13 DIAGNOSIS — I10 Essential (primary) hypertension: Secondary | ICD-10-CM | POA: Diagnosis not present

## 2015-12-20 DIAGNOSIS — N183 Chronic kidney disease, stage 3 (moderate): Secondary | ICD-10-CM | POA: Diagnosis not present

## 2015-12-20 DIAGNOSIS — M47816 Spondylosis without myelopathy or radiculopathy, lumbar region: Secondary | ICD-10-CM | POA: Diagnosis not present

## 2015-12-20 DIAGNOSIS — I481 Persistent atrial fibrillation: Secondary | ICD-10-CM | POA: Diagnosis not present

## 2015-12-20 DIAGNOSIS — E1121 Type 2 diabetes mellitus with diabetic nephropathy: Secondary | ICD-10-CM | POA: Diagnosis not present

## 2015-12-20 DIAGNOSIS — I5022 Chronic systolic (congestive) heart failure: Secondary | ICD-10-CM | POA: Diagnosis not present

## 2015-12-20 DIAGNOSIS — M1712 Unilateral primary osteoarthritis, left knee: Secondary | ICD-10-CM | POA: Diagnosis not present

## 2015-12-20 DIAGNOSIS — I10 Essential (primary) hypertension: Secondary | ICD-10-CM | POA: Diagnosis not present

## 2015-12-20 DIAGNOSIS — G8929 Other chronic pain: Secondary | ICD-10-CM | POA: Diagnosis not present

## 2015-12-20 DIAGNOSIS — M545 Low back pain: Secondary | ICD-10-CM | POA: Diagnosis not present

## 2015-12-20 DIAGNOSIS — E042 Nontoxic multinodular goiter: Secondary | ICD-10-CM | POA: Diagnosis not present

## 2016-03-15 DIAGNOSIS — E1121 Type 2 diabetes mellitus with diabetic nephropathy: Secondary | ICD-10-CM | POA: Diagnosis not present

## 2016-03-15 DIAGNOSIS — N183 Chronic kidney disease, stage 3 (moderate): Secondary | ICD-10-CM | POA: Diagnosis not present

## 2016-03-15 DIAGNOSIS — I481 Persistent atrial fibrillation: Secondary | ICD-10-CM | POA: Diagnosis not present

## 2016-03-24 DIAGNOSIS — R0609 Other forms of dyspnea: Secondary | ICD-10-CM | POA: Diagnosis not present

## 2016-03-24 DIAGNOSIS — Z23 Encounter for immunization: Secondary | ICD-10-CM | POA: Diagnosis not present

## 2016-03-24 DIAGNOSIS — I700 Atherosclerosis of aorta: Secondary | ICD-10-CM | POA: Diagnosis not present

## 2016-03-24 DIAGNOSIS — N183 Chronic kidney disease, stage 3 (moderate): Secondary | ICD-10-CM | POA: Diagnosis not present

## 2016-03-24 DIAGNOSIS — J9811 Atelectasis: Secondary | ICD-10-CM | POA: Diagnosis not present

## 2016-03-24 DIAGNOSIS — E1121 Type 2 diabetes mellitus with diabetic nephropathy: Secondary | ICD-10-CM | POA: Diagnosis not present

## 2016-03-24 DIAGNOSIS — I481 Persistent atrial fibrillation: Secondary | ICD-10-CM | POA: Diagnosis not present

## 2016-03-24 DIAGNOSIS — I10 Essential (primary) hypertension: Secondary | ICD-10-CM | POA: Diagnosis not present

## 2016-03-24 DIAGNOSIS — I5022 Chronic systolic (congestive) heart failure: Secondary | ICD-10-CM | POA: Diagnosis not present

## 2016-03-30 DIAGNOSIS — N183 Chronic kidney disease, stage 3 (moderate): Secondary | ICD-10-CM | POA: Diagnosis not present

## 2016-03-30 DIAGNOSIS — I5022 Chronic systolic (congestive) heart failure: Secondary | ICD-10-CM | POA: Diagnosis not present

## 2016-03-30 DIAGNOSIS — I481 Persistent atrial fibrillation: Secondary | ICD-10-CM | POA: Diagnosis not present

## 2016-03-30 DIAGNOSIS — I700 Atherosclerosis of aorta: Secondary | ICD-10-CM | POA: Diagnosis not present

## 2016-03-30 DIAGNOSIS — I10 Essential (primary) hypertension: Secondary | ICD-10-CM | POA: Diagnosis not present

## 2016-03-30 DIAGNOSIS — E1121 Type 2 diabetes mellitus with diabetic nephropathy: Secondary | ICD-10-CM | POA: Diagnosis not present

## 2016-04-07 DIAGNOSIS — I481 Persistent atrial fibrillation: Secondary | ICD-10-CM | POA: Diagnosis not present

## 2016-04-07 DIAGNOSIS — I5022 Chronic systolic (congestive) heart failure: Secondary | ICD-10-CM | POA: Diagnosis not present

## 2016-04-07 DIAGNOSIS — I10 Essential (primary) hypertension: Secondary | ICD-10-CM | POA: Diagnosis not present

## 2016-04-07 DIAGNOSIS — R0602 Shortness of breath: Secondary | ICD-10-CM | POA: Diagnosis not present

## 2016-04-07 DIAGNOSIS — I700 Atherosclerosis of aorta: Secondary | ICD-10-CM | POA: Diagnosis not present

## 2016-04-27 DIAGNOSIS — R0602 Shortness of breath: Secondary | ICD-10-CM | POA: Diagnosis not present

## 2016-05-03 DIAGNOSIS — I5022 Chronic systolic (congestive) heart failure: Secondary | ICD-10-CM | POA: Diagnosis not present

## 2016-05-03 DIAGNOSIS — I700 Atherosclerosis of aorta: Secondary | ICD-10-CM | POA: Diagnosis not present

## 2016-05-03 DIAGNOSIS — I481 Persistent atrial fibrillation: Secondary | ICD-10-CM | POA: Diagnosis not present

## 2016-05-03 DIAGNOSIS — I10 Essential (primary) hypertension: Secondary | ICD-10-CM | POA: Diagnosis not present

## 2016-05-17 DIAGNOSIS — H40003 Preglaucoma, unspecified, bilateral: Secondary | ICD-10-CM | POA: Diagnosis not present

## 2016-05-22 DIAGNOSIS — H40003 Preglaucoma, unspecified, bilateral: Secondary | ICD-10-CM | POA: Diagnosis not present

## 2016-06-20 DIAGNOSIS — I10 Essential (primary) hypertension: Secondary | ICD-10-CM | POA: Diagnosis not present

## 2016-06-20 DIAGNOSIS — I700 Atherosclerosis of aorta: Secondary | ICD-10-CM | POA: Diagnosis not present

## 2016-06-20 DIAGNOSIS — E1121 Type 2 diabetes mellitus with diabetic nephropathy: Secondary | ICD-10-CM | POA: Diagnosis not present

## 2016-06-20 DIAGNOSIS — I5022 Chronic systolic (congestive) heart failure: Secondary | ICD-10-CM | POA: Diagnosis not present

## 2016-06-20 DIAGNOSIS — N183 Chronic kidney disease, stage 3 (moderate): Secondary | ICD-10-CM | POA: Diagnosis not present

## 2016-06-27 DIAGNOSIS — E1121 Type 2 diabetes mellitus with diabetic nephropathy: Secondary | ICD-10-CM | POA: Diagnosis not present

## 2016-06-27 DIAGNOSIS — M1712 Unilateral primary osteoarthritis, left knee: Secondary | ICD-10-CM | POA: Diagnosis not present

## 2016-06-27 DIAGNOSIS — N183 Chronic kidney disease, stage 3 (moderate): Secondary | ICD-10-CM | POA: Diagnosis not present

## 2016-06-27 DIAGNOSIS — Z0000 Encounter for general adult medical examination without abnormal findings: Secondary | ICD-10-CM | POA: Diagnosis not present

## 2016-06-27 DIAGNOSIS — I5022 Chronic systolic (congestive) heart failure: Secondary | ICD-10-CM | POA: Diagnosis not present

## 2016-06-27 DIAGNOSIS — Z23 Encounter for immunization: Secondary | ICD-10-CM | POA: Diagnosis not present

## 2016-06-27 DIAGNOSIS — E042 Nontoxic multinodular goiter: Secondary | ICD-10-CM | POA: Diagnosis not present

## 2016-06-27 DIAGNOSIS — I10 Essential (primary) hypertension: Secondary | ICD-10-CM | POA: Diagnosis not present

## 2016-06-27 DIAGNOSIS — I481 Persistent atrial fibrillation: Secondary | ICD-10-CM | POA: Diagnosis not present

## 2016-06-27 DIAGNOSIS — I700 Atherosclerosis of aorta: Secondary | ICD-10-CM | POA: Diagnosis not present

## 2016-11-16 DIAGNOSIS — M5489 Other dorsalgia: Secondary | ICD-10-CM | POA: Diagnosis not present

## 2016-11-16 DIAGNOSIS — I700 Atherosclerosis of aorta: Secondary | ICD-10-CM | POA: Diagnosis not present

## 2016-11-16 DIAGNOSIS — I481 Persistent atrial fibrillation: Secondary | ICD-10-CM | POA: Diagnosis not present

## 2016-11-16 DIAGNOSIS — I10 Essential (primary) hypertension: Secondary | ICD-10-CM | POA: Diagnosis not present

## 2016-11-16 DIAGNOSIS — I5042 Chronic combined systolic (congestive) and diastolic (congestive) heart failure: Secondary | ICD-10-CM | POA: Diagnosis not present

## 2016-11-16 DIAGNOSIS — I4891 Unspecified atrial fibrillation: Secondary | ICD-10-CM | POA: Diagnosis not present

## 2016-11-16 DIAGNOSIS — I739 Peripheral vascular disease, unspecified: Secondary | ICD-10-CM | POA: Diagnosis not present

## 2016-11-20 DIAGNOSIS — H40003 Preglaucoma, unspecified, bilateral: Secondary | ICD-10-CM | POA: Diagnosis not present

## 2016-12-19 DIAGNOSIS — I481 Persistent atrial fibrillation: Secondary | ICD-10-CM | POA: Diagnosis not present

## 2016-12-19 DIAGNOSIS — I700 Atherosclerosis of aorta: Secondary | ICD-10-CM | POA: Diagnosis not present

## 2016-12-19 DIAGNOSIS — I10 Essential (primary) hypertension: Secondary | ICD-10-CM | POA: Diagnosis not present

## 2016-12-19 DIAGNOSIS — E1121 Type 2 diabetes mellitus with diabetic nephropathy: Secondary | ICD-10-CM | POA: Diagnosis not present

## 2016-12-26 DIAGNOSIS — I481 Persistent atrial fibrillation: Secondary | ICD-10-CM | POA: Diagnosis not present

## 2016-12-26 DIAGNOSIS — I700 Atherosclerosis of aorta: Secondary | ICD-10-CM | POA: Diagnosis not present

## 2016-12-26 DIAGNOSIS — I5042 Chronic combined systolic (congestive) and diastolic (congestive) heart failure: Secondary | ICD-10-CM | POA: Diagnosis not present

## 2016-12-26 DIAGNOSIS — E1121 Type 2 diabetes mellitus with diabetic nephropathy: Secondary | ICD-10-CM | POA: Diagnosis not present

## 2016-12-26 DIAGNOSIS — I10 Essential (primary) hypertension: Secondary | ICD-10-CM | POA: Diagnosis not present

## 2016-12-26 DIAGNOSIS — M1712 Unilateral primary osteoarthritis, left knee: Secondary | ICD-10-CM | POA: Diagnosis not present

## 2016-12-26 DIAGNOSIS — N183 Chronic kidney disease, stage 3 (moderate): Secondary | ICD-10-CM | POA: Diagnosis not present

## 2016-12-26 DIAGNOSIS — E042 Nontoxic multinodular goiter: Secondary | ICD-10-CM | POA: Diagnosis not present

## 2017-01-22 DIAGNOSIS — M48062 Spinal stenosis, lumbar region with neurogenic claudication: Secondary | ICD-10-CM | POA: Diagnosis not present

## 2017-01-22 DIAGNOSIS — M5136 Other intervertebral disc degeneration, lumbar region: Secondary | ICD-10-CM | POA: Diagnosis not present

## 2017-01-22 DIAGNOSIS — M5416 Radiculopathy, lumbar region: Secondary | ICD-10-CM | POA: Diagnosis not present

## 2017-01-29 DIAGNOSIS — M5416 Radiculopathy, lumbar region: Secondary | ICD-10-CM | POA: Diagnosis not present

## 2017-01-29 DIAGNOSIS — M5136 Other intervertebral disc degeneration, lumbar region: Secondary | ICD-10-CM | POA: Diagnosis not present

## 2017-03-07 DIAGNOSIS — M5136 Other intervertebral disc degeneration, lumbar region: Secondary | ICD-10-CM | POA: Diagnosis not present

## 2017-03-07 DIAGNOSIS — M48062 Spinal stenosis, lumbar region with neurogenic claudication: Secondary | ICD-10-CM | POA: Diagnosis not present

## 2017-03-07 DIAGNOSIS — M5416 Radiculopathy, lumbar region: Secondary | ICD-10-CM | POA: Diagnosis not present

## 2017-04-18 DIAGNOSIS — M47816 Spondylosis without myelopathy or radiculopathy, lumbar region: Secondary | ICD-10-CM | POA: Diagnosis not present

## 2017-04-18 DIAGNOSIS — M5136 Other intervertebral disc degeneration, lumbar region: Secondary | ICD-10-CM | POA: Diagnosis not present

## 2017-04-18 DIAGNOSIS — M48062 Spinal stenosis, lumbar region with neurogenic claudication: Secondary | ICD-10-CM | POA: Diagnosis not present

## 2017-05-03 DIAGNOSIS — M47816 Spondylosis without myelopathy or radiculopathy, lumbar region: Secondary | ICD-10-CM | POA: Diagnosis not present

## 2017-05-17 DIAGNOSIS — I481 Persistent atrial fibrillation: Secondary | ICD-10-CM | POA: Diagnosis not present

## 2017-05-17 DIAGNOSIS — I10 Essential (primary) hypertension: Secondary | ICD-10-CM | POA: Diagnosis not present

## 2017-05-17 DIAGNOSIS — I5042 Chronic combined systolic (congestive) and diastolic (congestive) heart failure: Secondary | ICD-10-CM | POA: Diagnosis not present

## 2017-05-17 DIAGNOSIS — R918 Other nonspecific abnormal finding of lung field: Secondary | ICD-10-CM | POA: Diagnosis not present

## 2017-05-17 DIAGNOSIS — I700 Atherosclerosis of aorta: Secondary | ICD-10-CM | POA: Diagnosis not present

## 2017-05-17 DIAGNOSIS — I4891 Unspecified atrial fibrillation: Secondary | ICD-10-CM | POA: Diagnosis not present

## 2017-05-21 DIAGNOSIS — H40003 Preglaucoma, unspecified, bilateral: Secondary | ICD-10-CM | POA: Diagnosis not present

## 2017-05-28 DIAGNOSIS — H40003 Preglaucoma, unspecified, bilateral: Secondary | ICD-10-CM | POA: Diagnosis not present

## 2017-06-01 DIAGNOSIS — I5042 Chronic combined systolic (congestive) and diastolic (congestive) heart failure: Secondary | ICD-10-CM | POA: Diagnosis not present

## 2017-06-01 DIAGNOSIS — I481 Persistent atrial fibrillation: Secondary | ICD-10-CM | POA: Diagnosis not present

## 2017-06-13 ENCOUNTER — Other Ambulatory Visit (INDEPENDENT_AMBULATORY_CARE_PROVIDER_SITE_OTHER): Payer: Self-pay | Admitting: Orthopedic Surgery

## 2017-06-13 ENCOUNTER — Other Ambulatory Visit: Payer: Self-pay | Admitting: Physical Medicine and Rehabilitation

## 2017-06-13 DIAGNOSIS — M5416 Radiculopathy, lumbar region: Secondary | ICD-10-CM | POA: Diagnosis not present

## 2017-06-13 DIAGNOSIS — M5136 Other intervertebral disc degeneration, lumbar region: Secondary | ICD-10-CM | POA: Diagnosis not present

## 2017-06-13 DIAGNOSIS — M48062 Spinal stenosis, lumbar region with neurogenic claudication: Secondary | ICD-10-CM | POA: Diagnosis not present

## 2017-06-13 DIAGNOSIS — M47816 Spondylosis without myelopathy or radiculopathy, lumbar region: Secondary | ICD-10-CM | POA: Diagnosis not present

## 2017-06-19 ENCOUNTER — Ambulatory Visit
Admission: RE | Admit: 2017-06-19 | Discharge: 2017-06-19 | Disposition: A | Discharge status: Home / Self Care | Payer: Medicare HMO | Source: Ambulatory Visit | Attending: Physical Medicine and Rehabilitation | Admitting: Physical Medicine and Rehabilitation

## 2017-06-19 DIAGNOSIS — M5416 Radiculopathy, lumbar region: Secondary | ICD-10-CM | POA: Insufficient documentation

## 2017-06-19 DIAGNOSIS — M47816 Spondylosis without myelopathy or radiculopathy, lumbar region: Secondary | ICD-10-CM | POA: Diagnosis not present

## 2017-06-19 DIAGNOSIS — M48061 Spinal stenosis, lumbar region without neurogenic claudication: Secondary | ICD-10-CM | POA: Insufficient documentation

## 2017-06-19 DIAGNOSIS — M5126 Other intervertebral disc displacement, lumbar region: Secondary | ICD-10-CM | POA: Insufficient documentation

## 2017-06-22 DIAGNOSIS — I10 Essential (primary) hypertension: Secondary | ICD-10-CM | POA: Diagnosis not present

## 2017-06-22 DIAGNOSIS — I481 Persistent atrial fibrillation: Secondary | ICD-10-CM | POA: Diagnosis not present

## 2017-06-22 DIAGNOSIS — I5042 Chronic combined systolic (congestive) and diastolic (congestive) heart failure: Secondary | ICD-10-CM | POA: Diagnosis not present

## 2017-06-22 DIAGNOSIS — I700 Atherosclerosis of aorta: Secondary | ICD-10-CM | POA: Diagnosis not present

## 2017-06-27 DIAGNOSIS — I700 Atherosclerosis of aorta: Secondary | ICD-10-CM | POA: Diagnosis not present

## 2017-06-27 DIAGNOSIS — E1121 Type 2 diabetes mellitus with diabetic nephropathy: Secondary | ICD-10-CM | POA: Diagnosis not present

## 2017-06-27 DIAGNOSIS — I10 Essential (primary) hypertension: Secondary | ICD-10-CM | POA: Diagnosis not present

## 2017-06-27 DIAGNOSIS — I481 Persistent atrial fibrillation: Secondary | ICD-10-CM | POA: Diagnosis not present

## 2017-07-02 DIAGNOSIS — N183 Chronic kidney disease, stage 3 (moderate): Secondary | ICD-10-CM | POA: Diagnosis not present

## 2017-07-02 DIAGNOSIS — M47816 Spondylosis without myelopathy or radiculopathy, lumbar region: Secondary | ICD-10-CM | POA: Diagnosis not present

## 2017-07-02 DIAGNOSIS — E042 Nontoxic multinodular goiter: Secondary | ICD-10-CM | POA: Diagnosis not present

## 2017-07-02 DIAGNOSIS — I10 Essential (primary) hypertension: Secondary | ICD-10-CM | POA: Diagnosis not present

## 2017-07-02 DIAGNOSIS — Z0000 Encounter for general adult medical examination without abnormal findings: Secondary | ICD-10-CM | POA: Diagnosis not present

## 2017-07-02 DIAGNOSIS — I481 Persistent atrial fibrillation: Secondary | ICD-10-CM | POA: Diagnosis not present

## 2017-07-02 DIAGNOSIS — E1121 Type 2 diabetes mellitus with diabetic nephropathy: Secondary | ICD-10-CM | POA: Diagnosis not present

## 2017-07-02 DIAGNOSIS — I5042 Chronic combined systolic (congestive) and diastolic (congestive) heart failure: Secondary | ICD-10-CM | POA: Diagnosis not present

## 2017-07-02 DIAGNOSIS — I700 Atherosclerosis of aorta: Secondary | ICD-10-CM | POA: Diagnosis not present

## 2017-07-25 DIAGNOSIS — M5416 Radiculopathy, lumbar region: Secondary | ICD-10-CM | POA: Diagnosis not present

## 2017-07-25 DIAGNOSIS — M47816 Spondylosis without myelopathy or radiculopathy, lumbar region: Secondary | ICD-10-CM | POA: Diagnosis not present

## 2017-07-25 DIAGNOSIS — M48062 Spinal stenosis, lumbar region with neurogenic claudication: Secondary | ICD-10-CM | POA: Diagnosis not present

## 2017-07-25 DIAGNOSIS — M5136 Other intervertebral disc degeneration, lumbar region: Secondary | ICD-10-CM | POA: Diagnosis not present

## 2017-08-08 DIAGNOSIS — M47816 Spondylosis without myelopathy or radiculopathy, lumbar region: Secondary | ICD-10-CM | POA: Diagnosis not present

## 2017-08-22 DIAGNOSIS — N183 Chronic kidney disease, stage 3 (moderate): Secondary | ICD-10-CM | POA: Diagnosis not present

## 2017-08-22 DIAGNOSIS — I700 Atherosclerosis of aorta: Secondary | ICD-10-CM | POA: Diagnosis not present

## 2017-08-22 DIAGNOSIS — I481 Persistent atrial fibrillation: Secondary | ICD-10-CM | POA: Diagnosis not present

## 2017-08-22 DIAGNOSIS — E1121 Type 2 diabetes mellitus with diabetic nephropathy: Secondary | ICD-10-CM | POA: Diagnosis not present

## 2017-08-22 DIAGNOSIS — I10 Essential (primary) hypertension: Secondary | ICD-10-CM | POA: Diagnosis not present

## 2017-08-22 DIAGNOSIS — R05 Cough: Secondary | ICD-10-CM | POA: Diagnosis not present

## 2017-08-22 DIAGNOSIS — I5042 Chronic combined systolic (congestive) and diastolic (congestive) heart failure: Secondary | ICD-10-CM | POA: Diagnosis not present

## 2017-08-22 DIAGNOSIS — J90 Pleural effusion, not elsewhere classified: Secondary | ICD-10-CM | POA: Diagnosis not present

## 2017-08-29 ENCOUNTER — Other Ambulatory Visit: Payer: Self-pay | Admitting: Internal Medicine

## 2017-08-29 DIAGNOSIS — J90 Pleural effusion, not elsewhere classified: Secondary | ICD-10-CM

## 2017-08-30 ENCOUNTER — Ambulatory Visit: Admission: RE | Admit: 2017-08-30 | Payer: Medicare HMO | Source: Ambulatory Visit

## 2017-08-31 ENCOUNTER — Ambulatory Visit
Admission: RE | Admit: 2017-08-31 | Discharge: 2017-08-31 | Disposition: A | Discharge status: Home / Self Care | Payer: Medicare HMO | Source: Ambulatory Visit | Attending: Internal Medicine | Admitting: Internal Medicine

## 2017-08-31 ENCOUNTER — Ambulatory Visit
Admission: RE | Admit: 2017-08-31 | Discharge: 2017-08-31 | Disposition: A | Discharge status: Home / Self Care | Payer: Medicare HMO | Source: Ambulatory Visit | Attending: Radiology | Admitting: Radiology

## 2017-08-31 DIAGNOSIS — J90 Pleural effusion, not elsewhere classified: Secondary | ICD-10-CM | POA: Insufficient documentation

## 2017-08-31 DIAGNOSIS — I700 Atherosclerosis of aorta: Secondary | ICD-10-CM | POA: Diagnosis not present

## 2017-08-31 DIAGNOSIS — Z9889 Other specified postprocedural states: Secondary | ICD-10-CM | POA: Insufficient documentation

## 2017-08-31 NOTE — Procedures (Signed)
Ultrasound-guided  therapeutic left thoracentesis performed yielding 600 cc of yellow fluid. No immediate complications. Follow-up chest x-ray pending.

## 2017-09-05 ENCOUNTER — Other Ambulatory Visit: Payer: Self-pay | Admitting: Physician Assistant

## 2017-09-05 DIAGNOSIS — I5042 Chronic combined systolic (congestive) and diastolic (congestive) heart failure: Secondary | ICD-10-CM | POA: Diagnosis not present

## 2017-09-05 DIAGNOSIS — J90 Pleural effusion, not elsewhere classified: Secondary | ICD-10-CM

## 2017-09-05 DIAGNOSIS — R05 Cough: Secondary | ICD-10-CM | POA: Diagnosis not present

## 2017-09-05 DIAGNOSIS — I481 Persistent atrial fibrillation: Secondary | ICD-10-CM | POA: Diagnosis not present

## 2017-09-05 DIAGNOSIS — I10 Essential (primary) hypertension: Secondary | ICD-10-CM | POA: Diagnosis not present

## 2017-09-18 ENCOUNTER — Ambulatory Visit
Admission: RE | Admit: 2017-09-18 | Discharge: 2017-09-18 | Disposition: A | Discharge status: Home / Self Care | Payer: Medicare HMO | Source: Ambulatory Visit | Attending: Physician Assistant | Admitting: Physician Assistant

## 2017-09-18 DIAGNOSIS — N6320 Unspecified lump in the left breast, unspecified quadrant: Secondary | ICD-10-CM | POA: Diagnosis not present

## 2017-09-18 DIAGNOSIS — J90 Pleural effusion, not elsewhere classified: Secondary | ICD-10-CM

## 2017-09-18 DIAGNOSIS — I700 Atherosclerosis of aorta: Secondary | ICD-10-CM | POA: Insufficient documentation

## 2017-09-18 DIAGNOSIS — I5042 Chronic combined systolic (congestive) and diastolic (congestive) heart failure: Secondary | ICD-10-CM | POA: Insufficient documentation

## 2017-09-18 DIAGNOSIS — R05 Cough: Secondary | ICD-10-CM | POA: Diagnosis not present

## 2017-09-18 DIAGNOSIS — I313 Pericardial effusion (noninflammatory): Secondary | ICD-10-CM | POA: Insufficient documentation

## 2017-09-19 DIAGNOSIS — I10 Essential (primary) hypertension: Secondary | ICD-10-CM | POA: Diagnosis not present

## 2017-09-19 DIAGNOSIS — I313 Pericardial effusion (noninflammatory): Secondary | ICD-10-CM | POA: Diagnosis not present

## 2017-09-19 DIAGNOSIS — I481 Persistent atrial fibrillation: Secondary | ICD-10-CM | POA: Diagnosis not present

## 2017-09-19 DIAGNOSIS — I5042 Chronic combined systolic (congestive) and diastolic (congestive) heart failure: Secondary | ICD-10-CM | POA: Diagnosis not present

## 2017-09-20 DIAGNOSIS — R05 Cough: Secondary | ICD-10-CM | POA: Diagnosis not present

## 2017-09-20 DIAGNOSIS — J90 Pleural effusion, not elsewhere classified: Secondary | ICD-10-CM | POA: Diagnosis not present

## 2017-09-20 DIAGNOSIS — I313 Pericardial effusion (noninflammatory): Secondary | ICD-10-CM | POA: Diagnosis not present

## 2017-10-08 DIAGNOSIS — J90 Pleural effusion, not elsewhere classified: Secondary | ICD-10-CM | POA: Diagnosis not present

## 2017-10-08 DIAGNOSIS — R0609 Other forms of dyspnea: Secondary | ICD-10-CM | POA: Diagnosis not present

## 2017-10-08 DIAGNOSIS — I313 Pericardial effusion (noninflammatory): Secondary | ICD-10-CM | POA: Diagnosis not present

## 2017-10-08 DIAGNOSIS — R0602 Shortness of breath: Secondary | ICD-10-CM | POA: Diagnosis not present

## 2017-10-11 DIAGNOSIS — M47816 Spondylosis without myelopathy or radiculopathy, lumbar region: Secondary | ICD-10-CM | POA: Diagnosis not present

## 2017-10-16 DIAGNOSIS — I313 Pericardial effusion (noninflammatory): Secondary | ICD-10-CM | POA: Diagnosis not present

## 2017-10-22 DIAGNOSIS — M47816 Spondylosis without myelopathy or radiculopathy, lumbar region: Secondary | ICD-10-CM | POA: Diagnosis not present

## 2017-10-22 DIAGNOSIS — M5416 Radiculopathy, lumbar region: Secondary | ICD-10-CM | POA: Diagnosis not present

## 2017-10-22 DIAGNOSIS — M5136 Other intervertebral disc degeneration, lumbar region: Secondary | ICD-10-CM | POA: Diagnosis not present

## 2017-10-22 DIAGNOSIS — M48062 Spinal stenosis, lumbar region with neurogenic claudication: Secondary | ICD-10-CM | POA: Diagnosis not present

## 2017-10-24 DIAGNOSIS — I481 Persistent atrial fibrillation: Secondary | ICD-10-CM | POA: Diagnosis not present

## 2017-10-24 DIAGNOSIS — I700 Atherosclerosis of aorta: Secondary | ICD-10-CM | POA: Diagnosis not present

## 2017-10-24 DIAGNOSIS — I5042 Chronic combined systolic (congestive) and diastolic (congestive) heart failure: Secondary | ICD-10-CM | POA: Diagnosis not present

## 2017-10-24 DIAGNOSIS — I10 Essential (primary) hypertension: Secondary | ICD-10-CM | POA: Diagnosis not present

## 2017-10-24 DIAGNOSIS — I313 Pericardial effusion (noninflammatory): Secondary | ICD-10-CM | POA: Diagnosis not present

## 2017-11-27 DIAGNOSIS — H40003 Preglaucoma, unspecified, bilateral: Secondary | ICD-10-CM | POA: Diagnosis not present

## 2017-12-24 DIAGNOSIS — I481 Persistent atrial fibrillation: Secondary | ICD-10-CM | POA: Diagnosis not present

## 2017-12-24 DIAGNOSIS — I10 Essential (primary) hypertension: Secondary | ICD-10-CM | POA: Diagnosis not present

## 2017-12-24 DIAGNOSIS — I5042 Chronic combined systolic (congestive) and diastolic (congestive) heart failure: Secondary | ICD-10-CM | POA: Diagnosis not present

## 2017-12-24 DIAGNOSIS — E042 Nontoxic multinodular goiter: Secondary | ICD-10-CM | POA: Diagnosis not present

## 2017-12-24 DIAGNOSIS — E1121 Type 2 diabetes mellitus with diabetic nephropathy: Secondary | ICD-10-CM | POA: Diagnosis not present

## 2017-12-24 DIAGNOSIS — N183 Chronic kidney disease, stage 3 (moderate): Secondary | ICD-10-CM | POA: Diagnosis not present

## 2017-12-25 DIAGNOSIS — M47816 Spondylosis without myelopathy or radiculopathy, lumbar region: Secondary | ICD-10-CM | POA: Diagnosis not present

## 2017-12-31 DIAGNOSIS — I481 Persistent atrial fibrillation: Secondary | ICD-10-CM | POA: Diagnosis not present

## 2017-12-31 DIAGNOSIS — E1121 Type 2 diabetes mellitus with diabetic nephropathy: Secondary | ICD-10-CM | POA: Diagnosis not present

## 2017-12-31 DIAGNOSIS — M1712 Unilateral primary osteoarthritis, left knee: Secondary | ICD-10-CM | POA: Diagnosis not present

## 2017-12-31 DIAGNOSIS — I5042 Chronic combined systolic (congestive) and diastolic (congestive) heart failure: Secondary | ICD-10-CM | POA: Diagnosis not present

## 2017-12-31 DIAGNOSIS — I313 Pericardial effusion (noninflammatory): Secondary | ICD-10-CM | POA: Diagnosis not present

## 2017-12-31 DIAGNOSIS — E042 Nontoxic multinodular goiter: Secondary | ICD-10-CM | POA: Diagnosis not present

## 2017-12-31 DIAGNOSIS — I700 Atherosclerosis of aorta: Secondary | ICD-10-CM | POA: Diagnosis not present

## 2017-12-31 DIAGNOSIS — I10 Essential (primary) hypertension: Secondary | ICD-10-CM | POA: Diagnosis not present

## 2017-12-31 DIAGNOSIS — J90 Pleural effusion, not elsewhere classified: Secondary | ICD-10-CM | POA: Diagnosis not present

## 2018-01-29 DIAGNOSIS — I5042 Chronic combined systolic (congestive) and diastolic (congestive) heart failure: Secondary | ICD-10-CM | POA: Diagnosis not present

## 2018-01-29 DIAGNOSIS — N183 Chronic kidney disease, stage 3 (moderate): Secondary | ICD-10-CM | POA: Diagnosis not present

## 2018-01-29 DIAGNOSIS — I313 Pericardial effusion (noninflammatory): Secondary | ICD-10-CM | POA: Diagnosis not present

## 2018-01-29 DIAGNOSIS — R768 Other specified abnormal immunological findings in serum: Secondary | ICD-10-CM | POA: Diagnosis not present

## 2018-02-08 DIAGNOSIS — M47816 Spondylosis without myelopathy or radiculopathy, lumbar region: Secondary | ICD-10-CM | POA: Diagnosis not present

## 2018-02-08 DIAGNOSIS — R0602 Shortness of breath: Secondary | ICD-10-CM | POA: Diagnosis not present

## 2018-02-08 DIAGNOSIS — M25561 Pain in right knee: Secondary | ICD-10-CM | POA: Diagnosis not present

## 2018-02-08 DIAGNOSIS — M1711 Unilateral primary osteoarthritis, right knee: Secondary | ICD-10-CM | POA: Diagnosis not present

## 2018-02-08 DIAGNOSIS — M48062 Spinal stenosis, lumbar region with neurogenic claudication: Secondary | ICD-10-CM | POA: Diagnosis not present

## 2018-02-08 DIAGNOSIS — M5416 Radiculopathy, lumbar region: Secondary | ICD-10-CM | POA: Diagnosis not present

## 2018-02-08 DIAGNOSIS — M5136 Other intervertebral disc degeneration, lumbar region: Secondary | ICD-10-CM | POA: Diagnosis not present

## 2018-02-08 DIAGNOSIS — G8929 Other chronic pain: Secondary | ICD-10-CM | POA: Diagnosis not present

## 2018-02-19 DIAGNOSIS — E1121 Type 2 diabetes mellitus with diabetic nephropathy: Secondary | ICD-10-CM | POA: Diagnosis not present

## 2018-02-19 DIAGNOSIS — I700 Atherosclerosis of aorta: Secondary | ICD-10-CM | POA: Diagnosis not present

## 2018-02-19 DIAGNOSIS — I10 Essential (primary) hypertension: Secondary | ICD-10-CM | POA: Diagnosis not present

## 2018-02-19 DIAGNOSIS — J90 Pleural effusion, not elsewhere classified: Secondary | ICD-10-CM | POA: Diagnosis not present

## 2018-02-19 DIAGNOSIS — I5042 Chronic combined systolic (congestive) and diastolic (congestive) heart failure: Secondary | ICD-10-CM | POA: Diagnosis not present

## 2018-02-19 DIAGNOSIS — N183 Chronic kidney disease, stage 3 (moderate): Secondary | ICD-10-CM | POA: Diagnosis not present

## 2018-02-19 DIAGNOSIS — I481 Persistent atrial fibrillation: Secondary | ICD-10-CM | POA: Diagnosis not present

## 2018-02-19 DIAGNOSIS — I313 Pericardial effusion (noninflammatory): Secondary | ICD-10-CM | POA: Diagnosis not present

## 2018-02-19 DIAGNOSIS — R768 Other specified abnormal immunological findings in serum: Secondary | ICD-10-CM | POA: Diagnosis not present

## 2018-02-26 DIAGNOSIS — I313 Pericardial effusion (noninflammatory): Secondary | ICD-10-CM | POA: Diagnosis not present

## 2018-02-26 DIAGNOSIS — E1121 Type 2 diabetes mellitus with diabetic nephropathy: Secondary | ICD-10-CM | POA: Diagnosis not present

## 2018-02-26 DIAGNOSIS — M1712 Unilateral primary osteoarthritis, left knee: Secondary | ICD-10-CM | POA: Diagnosis not present

## 2018-02-26 DIAGNOSIS — I10 Essential (primary) hypertension: Secondary | ICD-10-CM | POA: Diagnosis not present

## 2018-02-26 DIAGNOSIS — I481 Persistent atrial fibrillation: Secondary | ICD-10-CM | POA: Diagnosis not present

## 2018-02-26 DIAGNOSIS — N183 Chronic kidney disease, stage 3 (moderate): Secondary | ICD-10-CM | POA: Diagnosis not present

## 2018-02-26 DIAGNOSIS — I5042 Chronic combined systolic (congestive) and diastolic (congestive) heart failure: Secondary | ICD-10-CM | POA: Diagnosis not present

## 2018-02-26 DIAGNOSIS — E042 Nontoxic multinodular goiter: Secondary | ICD-10-CM | POA: Diagnosis not present

## 2018-02-26 DIAGNOSIS — I700 Atherosclerosis of aorta: Secondary | ICD-10-CM | POA: Diagnosis not present

## 2018-03-28 DIAGNOSIS — L570 Actinic keratosis: Secondary | ICD-10-CM | POA: Diagnosis not present

## 2018-03-28 DIAGNOSIS — R58 Hemorrhage, not elsewhere classified: Secondary | ICD-10-CM | POA: Diagnosis not present

## 2018-03-28 DIAGNOSIS — C44329 Squamous cell carcinoma of skin of other parts of face: Secondary | ICD-10-CM | POA: Diagnosis not present

## 2018-03-28 DIAGNOSIS — D485 Neoplasm of uncertain behavior of skin: Secondary | ICD-10-CM | POA: Diagnosis not present

## 2018-03-28 DIAGNOSIS — L281 Prurigo nodularis: Secondary | ICD-10-CM | POA: Diagnosis not present

## 2018-03-28 DIAGNOSIS — C44629 Squamous cell carcinoma of skin of left upper limb, including shoulder: Secondary | ICD-10-CM | POA: Diagnosis not present

## 2018-03-28 DIAGNOSIS — X32XXXA Exposure to sunlight, initial encounter: Secondary | ICD-10-CM | POA: Diagnosis not present

## 2018-05-01 DIAGNOSIS — I4819 Other persistent atrial fibrillation: Secondary | ICD-10-CM | POA: Diagnosis not present

## 2018-05-01 DIAGNOSIS — I5042 Chronic combined systolic (congestive) and diastolic (congestive) heart failure: Secondary | ICD-10-CM | POA: Diagnosis not present

## 2018-05-01 DIAGNOSIS — I10 Essential (primary) hypertension: Secondary | ICD-10-CM | POA: Diagnosis not present

## 2018-05-01 DIAGNOSIS — I313 Pericardial effusion (noninflammatory): Secondary | ICD-10-CM | POA: Diagnosis not present

## 2018-05-01 DIAGNOSIS — I700 Atherosclerosis of aorta: Secondary | ICD-10-CM | POA: Diagnosis not present

## 2018-05-16 DIAGNOSIS — L729 Follicular cyst of the skin and subcutaneous tissue, unspecified: Secondary | ICD-10-CM | POA: Diagnosis not present

## 2018-05-16 DIAGNOSIS — C44629 Squamous cell carcinoma of skin of left upper limb, including shoulder: Secondary | ICD-10-CM | POA: Diagnosis not present

## 2018-05-27 DIAGNOSIS — H40003 Preglaucoma, unspecified, bilateral: Secondary | ICD-10-CM | POA: Diagnosis not present

## 2018-05-29 DIAGNOSIS — C44329 Squamous cell carcinoma of skin of other parts of face: Secondary | ICD-10-CM | POA: Diagnosis not present

## 2018-06-03 DIAGNOSIS — T8140XA Infection following a procedure, unspecified, initial encounter: Secondary | ICD-10-CM | POA: Diagnosis not present

## 2018-07-07 ENCOUNTER — Other Ambulatory Visit: Payer: Self-pay

## 2018-07-07 ENCOUNTER — Encounter: Payer: Self-pay | Admitting: Emergency Medicine

## 2018-07-07 ENCOUNTER — Emergency Department: Payer: Medicare HMO

## 2018-07-07 ENCOUNTER — Inpatient Hospital Stay
Admission: EM | Admit: 2018-07-07 | Discharge: 2018-07-11 | DRG: 308 | Disposition: A | Discharge status: Home / Self Care | Payer: Medicare HMO | Attending: Family Medicine | Admitting: Family Medicine

## 2018-07-07 DIAGNOSIS — I4891 Unspecified atrial fibrillation: Secondary | ICD-10-CM | POA: Diagnosis not present

## 2018-07-07 DIAGNOSIS — Z7189 Other specified counseling: Secondary | ICD-10-CM | POA: Diagnosis not present

## 2018-07-07 DIAGNOSIS — I509 Heart failure, unspecified: Secondary | ICD-10-CM

## 2018-07-07 DIAGNOSIS — N183 Chronic kidney disease, stage 3 (moderate): Secondary | ICD-10-CM | POA: Diagnosis not present

## 2018-07-07 DIAGNOSIS — R0602 Shortness of breath: Secondary | ICD-10-CM | POA: Diagnosis not present

## 2018-07-07 DIAGNOSIS — I429 Cardiomyopathy, unspecified: Secondary | ICD-10-CM | POA: Diagnosis present

## 2018-07-07 DIAGNOSIS — Z9841 Cataract extraction status, right eye: Secondary | ICD-10-CM | POA: Diagnosis not present

## 2018-07-07 DIAGNOSIS — Z90710 Acquired absence of both cervix and uterus: Secondary | ICD-10-CM | POA: Diagnosis not present

## 2018-07-07 DIAGNOSIS — Z7901 Long term (current) use of anticoagulants: Secondary | ICD-10-CM | POA: Diagnosis not present

## 2018-07-07 DIAGNOSIS — E7800 Pure hypercholesterolemia, unspecified: Secondary | ICD-10-CM | POA: Diagnosis present

## 2018-07-07 DIAGNOSIS — Z515 Encounter for palliative care: Secondary | ICD-10-CM | POA: Diagnosis not present

## 2018-07-07 DIAGNOSIS — Z8249 Family history of ischemic heart disease and other diseases of the circulatory system: Secondary | ICD-10-CM | POA: Diagnosis not present

## 2018-07-07 DIAGNOSIS — Z66 Do not resuscitate: Secondary | ICD-10-CM | POA: Diagnosis present

## 2018-07-07 DIAGNOSIS — Z9049 Acquired absence of other specified parts of digestive tract: Secondary | ICD-10-CM

## 2018-07-07 DIAGNOSIS — I10 Essential (primary) hypertension: Secondary | ICD-10-CM | POA: Diagnosis not present

## 2018-07-07 DIAGNOSIS — Z9889 Other specified postprocedural states: Secondary | ICD-10-CM

## 2018-07-07 DIAGNOSIS — Z85828 Personal history of other malignant neoplasm of skin: Secondary | ICD-10-CM | POA: Diagnosis not present

## 2018-07-07 DIAGNOSIS — Z961 Presence of intraocular lens: Secondary | ICD-10-CM | POA: Diagnosis present

## 2018-07-07 DIAGNOSIS — I248 Other forms of acute ischemic heart disease: Secondary | ICD-10-CM | POA: Diagnosis present

## 2018-07-07 DIAGNOSIS — I5023 Acute on chronic systolic (congestive) heart failure: Secondary | ICD-10-CM | POA: Diagnosis present

## 2018-07-07 DIAGNOSIS — Z79899 Other long term (current) drug therapy: Secondary | ICD-10-CM

## 2018-07-07 DIAGNOSIS — K219 Gastro-esophageal reflux disease without esophagitis: Secondary | ICD-10-CM | POA: Diagnosis present

## 2018-07-07 DIAGNOSIS — J209 Acute bronchitis, unspecified: Secondary | ICD-10-CM | POA: Diagnosis present

## 2018-07-07 DIAGNOSIS — I5031 Acute diastolic (congestive) heart failure: Secondary | ICD-10-CM | POA: Diagnosis not present

## 2018-07-07 DIAGNOSIS — J90 Pleural effusion, not elsewhere classified: Secondary | ICD-10-CM | POA: Diagnosis not present

## 2018-07-07 DIAGNOSIS — I313 Pericardial effusion (noninflammatory): Secondary | ICD-10-CM | POA: Diagnosis not present

## 2018-07-07 DIAGNOSIS — R918 Other nonspecific abnormal finding of lung field: Secondary | ICD-10-CM | POA: Diagnosis not present

## 2018-07-07 DIAGNOSIS — E785 Hyperlipidemia, unspecified: Secondary | ICD-10-CM | POA: Diagnosis present

## 2018-07-07 DIAGNOSIS — I130 Hypertensive heart and chronic kidney disease with heart failure and stage 1 through stage 4 chronic kidney disease, or unspecified chronic kidney disease: Secondary | ICD-10-CM | POA: Diagnosis not present

## 2018-07-07 LAB — COMPREHENSIVE METABOLIC PANEL
ALK PHOS: 80 U/L (ref 38–126)
ALT: 42 U/L (ref 0–44)
ANION GAP: 18 — AB (ref 5–15)
AST: 81 U/L — ABNORMAL HIGH (ref 15–41)
Albumin: 3.9 g/dL (ref 3.5–5.0)
BUN: 26 mg/dL — ABNORMAL HIGH (ref 8–23)
CALCIUM: 9.3 mg/dL (ref 8.9–10.3)
CO2: 14 mmol/L — ABNORMAL LOW (ref 22–32)
Chloride: 103 mmol/L (ref 98–111)
Creatinine, Ser: 1.13 mg/dL — ABNORMAL HIGH (ref 0.44–1.00)
GFR calc Af Amer: 52 mL/min — ABNORMAL LOW (ref >60)
GFR calc non Af Amer: 45 mL/min — ABNORMAL LOW (ref >60)
Glucose, Bld: 180 mg/dL — ABNORMAL HIGH (ref 70–99)
Potassium: 4.6 mmol/L (ref 3.5–5.1)
Sodium: 135 mmol/L (ref 135–145)
Total Bilirubin: 2.4 mg/dL — ABNORMAL HIGH (ref 0.3–1.2)
Total Protein: 6.7 g/dL (ref 6.5–8.1)

## 2018-07-07 LAB — CBC WITH DIFFERENTIAL/PLATELET
Abs Immature Granulocytes: 0.04 10*3/uL (ref 0.00–0.07)
BASOS ABS: 0 10*3/uL (ref 0.0–0.1)
Basophils Relative: 0 %
EOS ABS: 0.2 10*3/uL (ref 0.0–0.5)
Eosinophils Relative: 3 %
HEMATOCRIT: 46.8 % — AB (ref 36.0–46.0)
HEMOGLOBIN: 14.9 g/dL (ref 12.0–15.0)
IMMATURE GRANULOCYTES: 1 %
LYMPHS ABS: 0.5 10*3/uL — AB (ref 0.7–4.0)
LYMPHS PCT: 7 %
MCH: 31 pg (ref 26.0–34.0)
MCHC: 31.8 g/dL (ref 30.0–36.0)
MCV: 97.5 fL (ref 80.0–100.0)
Monocytes Absolute: 0.8 10*3/uL (ref 0.1–1.0)
Monocytes Relative: 11 %
NEUTROS ABS: 5.7 10*3/uL (ref 1.7–7.7)
NEUTROS PCT: 78 %
NRBC: 0 % (ref 0.0–0.2)
Platelets: 192 10*3/uL (ref 150–400)
RBC: 4.8 MIL/uL (ref 3.87–5.11)
RDW: 13.1 % (ref 11.5–15.5)
WBC: 7.2 10*3/uL (ref 4.0–10.5)

## 2018-07-07 LAB — TROPONIN I
Troponin I: 0.1 ng/mL (ref <0.03)
Troponin I: 0.16 ng/mL (ref <0.03)
Troponin I: 0.18 ng/mL (ref <0.03)

## 2018-07-07 LAB — BRAIN NATRIURETIC PEPTIDE: B Natriuretic Peptide: 1712 pg/mL — ABNORMAL HIGH (ref 0.0–100.0)

## 2018-07-07 MED ORDER — METOPROLOL TARTRATE 25 MG PO TABS
25.0000 mg | ORAL_TABLET | Freq: Two times a day (BID) | ORAL | Status: DC
  Administered 2018-07-07 – 2018-07-09 (×3): 25 mg via ORAL
  Filled 2018-07-07 (×4): qty 1

## 2018-07-07 MED ORDER — DILTIAZEM HCL 25 MG/5ML IV SOLN
20.0000 mg | Freq: Once | INTRAVENOUS | Status: AC
  Administered 2018-07-07: 20 mg via INTRAVENOUS
  Filled 2018-07-07: qty 5

## 2018-07-07 MED ORDER — DILTIAZEM HCL ER COATED BEADS 300 MG PO CP24
300.0000 mg | ORAL_CAPSULE | Freq: Every day | ORAL | Status: DC
  Administered 2018-07-08 – 2018-07-11 (×4): 300 mg via ORAL
  Filled 2018-07-07 (×5): qty 1

## 2018-07-07 MED ORDER — ONDANSETRON HCL 4 MG PO TABS: 4.0000 mg | ORAL_TABLET | Freq: Four times a day (QID) | ORAL | Status: DC | PRN

## 2018-07-07 MED ORDER — SODIUM CHLORIDE 0.9% FLUSH
3.0000 mL | Freq: Two times a day (BID) | INTRAVENOUS | Status: DC
  Administered 2018-07-07 – 2018-07-11 (×7): 3 mL via INTRAVENOUS

## 2018-07-07 MED ORDER — POLYETHYLENE GLYCOL 3350 17 G PO PACK: 17.0000 g | PACK | Freq: Every day | ORAL | Status: DC | PRN

## 2018-07-07 MED ORDER — METOPROLOL TARTRATE 5 MG/5ML IV SOLN: 5.0000 mg | INTRAVENOUS | Status: DC | PRN

## 2018-07-07 MED ORDER — POTASSIUM CHLORIDE CRYS ER 10 MEQ PO TBCR
10.0000 meq | EXTENDED_RELEASE_TABLET | Freq: Every day | ORAL | Status: DC
  Administered 2018-07-08 – 2018-07-11 (×4): 10 meq via ORAL
  Filled 2018-07-07 (×4): qty 1

## 2018-07-07 MED ORDER — PANTOPRAZOLE SODIUM 40 MG PO TBEC
40.0000 mg | DELAYED_RELEASE_TABLET | Freq: Every day | ORAL | Status: DC
  Administered 2018-07-08 – 2018-07-11 (×4): 40 mg via ORAL
  Filled 2018-07-07 (×4): qty 1

## 2018-07-07 MED ORDER — ACETAMINOPHEN 650 MG RE SUPP: 650.0000 mg | Freq: Four times a day (QID) | RECTAL | Status: DC | PRN

## 2018-07-07 MED ORDER — FUROSEMIDE 10 MG/ML IJ SOLN
40.0000 mg | Freq: Once | INTRAMUSCULAR | Status: AC
  Administered 2018-07-07: 40 mg via INTRAVENOUS

## 2018-07-07 MED ORDER — FUROSEMIDE 10 MG/ML IJ SOLN
INTRAMUSCULAR | Status: AC
  Administered 2018-07-07: 40 mg via INTRAVENOUS
  Filled 2018-07-07: qty 4

## 2018-07-07 MED ORDER — ONDANSETRON HCL 4 MG/2ML IJ SOLN: 4.0000 mg | Freq: Four times a day (QID) | INTRAMUSCULAR | Status: DC | PRN

## 2018-07-07 MED ORDER — ATORVASTATIN CALCIUM 10 MG PO TABS
10.0000 mg | ORAL_TABLET | Freq: Every day | ORAL | Status: DC
  Administered 2018-07-09 – 2018-07-10 (×2): 10 mg via ORAL
  Filled 2018-07-07 (×3): qty 1

## 2018-07-07 MED ORDER — LATANOPROST 0.005 % OP SOLN
1.0000 [drp] | Freq: Every evening | OPHTHALMIC | Status: DC
  Administered 2018-07-08 – 2018-07-10 (×3): 1 [drp] via OPHTHALMIC
  Filled 2018-07-07: qty 2.5

## 2018-07-07 MED ORDER — ACETAMINOPHEN 325 MG PO TABS: 650.0000 mg | ORAL_TABLET | Freq: Four times a day (QID) | ORAL | Status: DC | PRN

## 2018-07-07 MED ORDER — DILTIAZEM HCL-DEXTROSE 100-5 MG/100ML-% IV SOLN (PREMIX)
5.0000 mg/h | INTRAVENOUS | Status: DC
  Administered 2018-07-07 – 2018-07-08 (×2): 5 mg/h via INTRAVENOUS
  Filled 2018-07-07 (×3): qty 100

## 2018-07-07 MED ORDER — ALBUTEROL SULFATE (2.5 MG/3ML) 0.083% IN NEBU: 2.5000 mg | INHALATION_SOLUTION | RESPIRATORY_TRACT | Status: DC | PRN

## 2018-07-07 MED ORDER — PREDNISONE 20 MG PO TABS
50.0000 mg | ORAL_TABLET | Freq: Every day | ORAL | Status: DC
  Administered 2018-07-08: 50 mg via ORAL
  Filled 2018-07-07: qty 1

## 2018-07-07 NOTE — Progress Notes (Signed)
Advance care planning  Purpose of Encounter Atrial fibrillation congestive heart failure  Parties in Attendance Patient and son at bedside  Patients Decisional capacity Alert and oriented.  Able to make medical decisions.  Her documented healthcare power of attorney is her daughter-in-law Autumn Bradley.  Discussed regarding A. fib, CHF and plan of care.  CODE STATUS discussed.  Patient would like to be DO NOT RESUSCITATE DO NOT INTUBATE.  Orders entered and CODE STATUS changed  DNR/DNI  Time spent - 17 minutes

## 2018-07-07 NOTE — H&P (Signed)
[facility] NAME: Autumn Bradley    MR#:  [id number]  DATE OF BIRTH:  1932-02-20  DATE OF ADMISSION:  07/07/2018  PRIMARY CARE PHYSICIAN: Adin Hector, MD   REQUESTING/REFERRING PHYSICIAN: Dr. Corky Downs  CHIEF COMPLAINT:   Chief Complaint  Patient presents with  . Shortness of Breath  . Palpitations    HISTORY OF PRESENT ILLNESS:  Autumn Bradley  is a 85 y.o. female with a known history of chronic systolic CHF with ejection fraction 45%, atrial fibrillation on Eliquis, hypertension, CKD stage III, chronic mild pericardial effusion presents to the emergency room complaining of shortness of breath since yesterday.  Here in the ER patient was found to have atrial fibrillation with rapid ventricular rate.  Given Cardizem bolus and started on Cardizem drip.  Patient also felt congestion in her chest and coughing with some wheezing since 2 days. Chest x-ray shows moderate pleural effusion on the left side.  She has had prior left pleural effusion needing thoracentesis in March 2019 where 600 mL of transudate of fluid was removed.  She also had a large pericardial effusion diagnosed on CAT scan which seem to have been over estimated on CT scan as an echocardiogram showed small pericardial effusion.  PAST MEDICAL HISTORY:   Past Medical History:  Diagnosis Date  . Atrial fibrillation ([place])   . Cancer [facility])    SKIN CANCER  . Degenerative joint disease   . Glaucoma   . Hypercholesteremia   . Hypertension    CONTROLLED ON MEDS  . Shortness of breath dyspnea    [place]  . Wears dentures    partial upper and lower    PAST SURGICAL HISTORY:   Past Surgical History:  Procedure Laterality Date  . ABDOMINAL HYSTERECTOMY    . CARDIAC CATHETERIZATION  07/2012   [facility] - Neg for blockages  . CARDIAC ELECTROPHYSIOLOGY STUDY AND ABLATION    . CATARACT EXTRACTION W/PHACO Left 03/17/2015   Procedure: CATARACT EXTRACTION PHACO AND INTRAOCULAR  LENS PLACEMENT (IOC);  Surgeon: Leandrew Koyanagi, MD;  Location: [facility];  Service: Ophthalmology;  Laterality: Left;  . CATARACT EXTRACTION W/PHACO Right 04/14/2015   Procedure: CATARACT EXTRACTION PHACO AND INTRAOCULAR LENS PLACEMENT (IOC);  Surgeon: Leandrew Koyanagi, MD;  Location: [facility];  Service: Ophthalmology;  Laterality: Right;  . CHOLECYSTECTOMY    . COLONOSCOPY    . TONSILLECTOMY      SOCIAL HISTORY:   Social History   Tobacco Use  . Smoking status: Never Smoker  Substance Use Topics  . Alcohol use: No    FAMILY HISTORY:   CAD DRUG ALLERGIES:  No Known Allergies  REVIEW OF SYSTEMS:   Review of Systems  Constitutional: Positive for chills and malaise/fatigue. Negative for fever and weight loss.  HENT: Negative for hearing loss and nosebleeds.   Eyes: Negative for blurred vision, double vision and pain.  Respiratory: Positive for cough, shortness of breath and wheezing. Negative for hemoptysis and sputum production.   Cardiovascular: Positive for orthopnea and leg swelling. Negative for chest pain and palpitations.  Gastrointestinal: Negative for abdominal pain, constipation, diarrhea, nausea and vomiting.  Genitourinary: Negative for dysuria and hematuria.  Musculoskeletal: Negative for back pain, falls and myalgias.  Skin: Negative for rash.  Neurological: Negative for dizziness, tremors, sensory change, speech change, focal weakness, seizures and headaches.  Endo/Heme/Allergies: Does not bruise/bleed easily.  Psychiatric/Behavioral: Negative for depression and memory loss. The patient is not nervous/anxious.  MEDICATIONS AT HOME:   Prior to Admission medications   Medication Sig Start Date End Date Taking? Authorizing Provider  apixaban (ELIQUIS) 5 MG TABS tablet Take 5 mg by mouth 2 (two) times daily. BREAKFAST AND BEDTIME   Yes [provider]  atorvastatin (LIPITOR) 10 MG tablet Take 10 mg by mouth daily. PM   Yes  [provider]  Calcium Carb-Cholecalciferol (CALCIUM 600 + D PO) Take by mouth. AM   Yes [provider]  Cholecalciferol (VITAMIN D3) 50 MCG (2000 UT) capsule Take 2,000 Units by mouth daily at 6 (six) AM.   Yes [provider]  diltiazem (TIAZAC) 300 MG 24 hr capsule Take 300 mg by mouth daily. AM   Yes [provider]  furosemide (LASIX) 20 MG tablet Take 20 mg by mouth daily as needed. 12/31/17  Yes [provider]  latanoprost (XALATAN) 0.005 % ophthalmic solution Apply 1 drop to eye Nightly.   Yes [provider]  lisinopril (PRINIVIL,ZESTRIL) 2.5 MG tablet Take 2.5 mg by mouth daily. AM   Yes [provider]  pantoprazole (PROTONIX) 40 MG tablet Take 40 mg by mouth daily at 6 (six) AM. 04/08/18 04/08/19 Yes [provider]  potassium chloride (K-DUR,KLOR-CON) 10 MEQ tablet Take 10 mEq by mouth daily at 6 (six) AM. 12/31/17  Yes [provider]  metoprolol tartrate (LOPRESSOR) 25 MG tablet Take 25 mg by mouth 2 (two) times daily. BREAKFAST AND BEDTIME    [provider]  Omega-3 Fatty Acids (OMEGA-3 FISH OIL PO) Take 300 mg by mouth. 2 A DAY    [provider]     VITAL SIGNS:  Blood pressure 125/89, pulse (!) 121, temperature 98.2 F (36.8 C), temperature source Oral, resp. rate (!) 30, SpO2 95 %.  PHYSICAL EXAMINATION:  Physical Exam  GENERAL:  85 y.o.-year-old patient lying in the bed EYES: Pupils equal, round, reactive to light and accommodation. No scleral icterus. Extraocular muscles intact.  HEENT: Head atraumatic, normocephalic. Oropharynx and nasopharynx clear. No oropharyngeal erythema, moist oral mucosa  NECK:  Supple, no jugular venous distention. No thyroid enlargement, no tenderness.  LUNGS: Bilateral wheezing with decreased air entry on the left CARDIOVASCULAR: Irregularly irregular.  Tachycardia ABDOMEN: Soft, nontender, nondistended. Bowel sounds present. No organomegaly or  mass.  EXTREMITIES: No pedal edema, cyanosis, or clubbing. + 2 pedal & radial pulses b/l.   NEUROLOGIC: Cranial nerves II through XII are intact. No focal Motor or sensory deficits appreciated b/l PSYCHIATRIC: The patient is alert and oriented x 3. Good affect.  SKIN: No obvious rash, lesion, or ulcer.   LABORATORY PANEL:   CBC Recent Labs  Lab 07/07/18 0747  WBC 7.2  HGB 14.9  HCT 46.8*  PLT 192   ------------------------------------------------------------------------------------------------------------------  Chemistries  Recent Labs  Lab 07/07/18 0748  NA 135  K 4.6  CL 103  CO2 14*  GLUCOSE 180*  BUN 26*  CREATININE 1.13*  CALCIUM 9.3  AST 81*  ALT 42  ALKPHOS 80  BILITOT 2.4*   ------------------------------------------------------------------------------------------------------------------  Cardiac Enzymes Recent Labs  Lab 07/07/18 0747  TROPONINI 0.10*   ------------------------------------------------------------------------------------------------------------------  RADIOLOGY:  Dg Chest Portable 1 View  Result Date: 07/07/2018 CLINICAL DATA:  Shortness of breath this morning. EXAM: PORTABLE CHEST 1 VIEW COMPARISON:  08/31/2017 and CT 09/18/2017 FINDINGS: Lungs are adequately inflated with opacification over the left mid to lower lung with blunting of the left costophrenic angle likely moderate size effusion with associated left basilar atelectasis. Right lung  is clear. Moderate to severe cardiomegaly unchanged partially due to moderate size pericardial effusion as seen on previous CT. Remainder the exam is unchanged. IMPRESSION: Moderate opacification over the left mid to lower lung suggesting moderate size effusion with associated basilar atelectasis. Stable moderate to severe cardiomegaly due in part to moderate size pericardial effusion as demonstrated on previous CT. Electronically Signed   By: Marin Olp M.D.   On: 07/07/2018 08:03     IMPRESSION  AND PLAN:   *Atrial fibrillation with rapid ventricular rate.  Likely decompensated due to acute bronchitis and left pleural effusion.  Presently on Cardizem drip.  She has not taken her metoprolol or Cardizem at home today morning.  Will restart these medications and wean off drip as tolerated.  Hold Eliquis as thoracentesis is pending  *Acute bronchitis.  Will start on steroids.  Nebulizers as needed.  No need for antibiotics.  *Acute on chronic systolic congestive heart failure.  Ejection fraction 45%. - IV Lasix, Beta blockers - Input and Output - Counseled to limit fluids and Salt - Monitor Bun/Cr and Potassium - Echo-report reviewed from [facility]. Will repeat echocardiogram due to concern for worsening pericardial effusion on chest x-ray. -Cardiology follow up after discharge  *Left pleural effusion.  Likely due to congestive heart failure.  Unilateral and moderate.  I do not think this would resolve just with diuresis.  Will order ultrasound-guided thoracentesis.  Eliquis held.  Last dose was on 07/06/2018 a.m.  *Hypertension.  Continue home medications.  DVT prophylaxis.  Will start Lovenox after thoracentesis.  All the records are reviewed and case discussed with ED provider. Management plans discussed with the patient, family and they are in agreement.  CODE STATUS: DO NOT RESUSCITATE and DO NOT INTUBATE  TOTAL TIME TAKING CARE OF THIS PATIENT: 40 minutes.   Leia Alf Penny Frisbie M.D on 07/07/2018 at 10:16 AM  Between 7am to 6pm - Pager - [phone]  After 6pm go to [email or web address] - password [email or web address] [facility]  [facility]  Office  [phone]  CC: Primary care physician; Adin Hector, MD  Note: This dictation was prepared with Dragon dictation along with smaller phrase technology. Any transcriptional errors that result from this process are unintentional.

## 2018-07-07 NOTE — ED Notes (Signed)
Date and time results received: 07/07/18 0818   Test: troponin Critical Value: 0.10  Name of Provider Notified: Dr. Corky Downs

## 2018-07-07 NOTE — ED Triage Notes (Signed)
Pt to ED via POV c/o shortness of breath. Pt states that she woke up feeling short of breath. EMS was called and checked pt. Per pt her heart feels like it is racing. Pt has hx/ A Fib. Pt is also congested. Pt color is WNL

## 2018-07-07 NOTE — ED Provider Notes (Signed)
[facility] Emergency Department Provider Note   ____________________________________________    I have reviewed the triage vital signs and the nursing notes.   HISTORY  Chief Complaint Shortness of Breath and Palpitations     HPI Autumn Bradley is a 85 y.o. female with a history of atrial fibrillation, on Eliquis also with reported CHF who presents today with shortness of breath and a sense of her heart racing.  Patient notes that she has had a pleural effusion in the past which required thoracentesis, she wonders if that may be the case today however she does report that her heart has been racing over the last 24 hours which is unusual for her.  Typically atrial fibrillation is well controlled on Cardizem.  Denies fevers or chills, productive cough.  No calf pain or swelling   Past Medical History:  Diagnosis Date  . Atrial fibrillation ([place])   . Cancer [facility])    SKIN CANCER  . Degenerative joint disease   . Glaucoma   . Hypercholesteremia   . Hypertension    CONTROLLED ON MEDS  . Shortness of breath dyspnea    [place]  . Wears dentures    partial upper and lower    Patient Active Problem List   Diagnosis Date Noted  . CHF (congestive heart failure) ([place]) 07/07/2018    Past Surgical History:  Procedure Laterality Date  . ABDOMINAL HYSTERECTOMY    . CARDIAC CATHETERIZATION  07/2012   [facility] - Neg for blockages  . CARDIAC ELECTROPHYSIOLOGY STUDY AND ABLATION    . CATARACT EXTRACTION W/PHACO Left 03/17/2015   Procedure: CATARACT EXTRACTION PHACO AND INTRAOCULAR LENS PLACEMENT (IOC);  Surgeon: Leandrew Koyanagi, MD;  Location: [place];  Service: Ophthalmology;  Laterality: Left;  . CATARACT EXTRACTION W/PHACO Right 04/14/2015   Procedure: CATARACT EXTRACTION PHACO AND INTRAOCULAR LENS PLACEMENT (IOC);  Surgeon: Leandrew Koyanagi, MD;  Location: [place];  Service: Ophthalmology;  Laterality: Right;  .  CHOLECYSTECTOMY    . COLONOSCOPY    . TONSILLECTOMY      Prior to Admission medications   Medication Sig Start Date End Date Taking? Authorizing Provider  apixaban (ELIQUIS) 5 MG TABS tablet Take 5 mg by mouth 2 (two) times daily. BREAKFAST AND BEDTIME   Yes [provider]  atorvastatin (LIPITOR) 10 MG tablet Take 10 mg by mouth daily. PM   Yes [provider]  Calcium Carb-Cholecalciferol (CALCIUM 600 + D PO) Take by mouth. AM   Yes [provider]  Cholecalciferol (VITAMIN D3) 50 MCG (2000 UT) capsule Take 2,000 Units by mouth daily at 6 (six) AM.   Yes [provider]  diltiazem (TIAZAC) 300 MG 24 hr capsule Take 300 mg by mouth daily. AM   Yes [provider]  furosemide (LASIX) 20 MG tablet Take 20 mg by mouth daily as needed. 12/31/17  Yes [provider]  latanoprost (XALATAN) 0.005 % ophthalmic solution Apply 1 drop to eye Nightly.   Yes [provider]  lisinopril (PRINIVIL,ZESTRIL) 2.5 MG tablet Take 2.5 mg by mouth daily. AM   Yes [provider]  metoprolol succinate (TOPROL-XL) 25 MG 24 hr tablet Take 25 mg by mouth daily at 6 (six) AM. 02/19/18 02/19/19 Yes [provider]  pantoprazole (PROTONIX) 40 MG tablet Take 40 mg by mouth daily at 6 (six) AM. 04/08/18 04/08/19 Yes [provider]  potassium chloride (K-DUR,KLOR-CON) 10 MEQ tablet Take 10 mEq by mouth daily at  6 (six) AM. 12/31/17  Yes [provider]  metoprolol tartrate (LOPRESSOR) 25 MG tablet Take 25 mg by mouth 2 (two) times daily. BREAKFAST AND BEDTIME    [provider]  Omega-3 Fatty Acids (OMEGA-3 FISH OIL PO) Take 300 mg by mouth. 2 A DAY    [provider]     Allergies Patient has no known allergies.  No family history on file.  Social History Social History   Tobacco Use  . Smoking status: Never Smoker  Substance Use Topics  . Alcohol use: No  . Drug use: Not on file    Review of  Systems  Constitutional: No fever/chills Eyes: No visual changes.  ENT: No sore throat. Cardiovascular: Denies chest pain.  Heart racing Respiratory: As above Gastrointestinal: No abdominal pain. .   Genitourinary: Negative for dysuria. Musculoskeletal: No lower extremity swelling Skin: Negative for rash. Neurological: Negative for headaches or weakness   ____________________________________________   PHYSICAL EXAM:  VITAL SIGNS: ED Triage Vitals [07/07/18 0740]  Enc Vitals Group     BP 136/81     Pulse      Resp 18     Temp 98.2 F (36.8 C)     Temp Source Oral     SpO2 96 %     Weight      Height      Head Circumference      Peak Flow      Pain Score      Pain Loc      Pain Edu?      Excl. in [place]?     Constitutional: Alert and oriented. Eyes: Conjunctivae are normal.   Nose: No congestion/rhinnorhea. Mouth/Throat: Mucous membranes are moist.    Cardiovascular: Rapid rate, regular rhythm. Grossly normal heart sounds.  Good peripheral circulation. Respiratory: Increased respiratory effort with tachypnea, no retractions, decreased breath sounds in the left Gastrointestinal: Soft and nontender. No distention.    Musculoskeletal: No lower extremity tenderness nor edema.  Warm and well perfused Neurologic:  Normal speech and language. No gross focal neurologic deficits are appreciated.  Skin:  Skin is warm, dry and intact. No rash noted. Psychiatric: Mood and affect are normal. Speech and behavior are normal.  ____________________________________________   LABS (all labs ordered are listed, but only abnormal results are displayed)  Labs Reviewed  CBC WITH DIFFERENTIAL/PLATELET - Abnormal; Notable for the following components:      Result Value   HCT 46.8 (*)    Lymphs Abs 0.5 (*)    All other components within normal limits  TROPONIN I - Abnormal; Notable for the following components:   Troponin I 0.10 (*)    All other components within normal limits   COMPREHENSIVE METABOLIC PANEL - Abnormal; Notable for the following components:   CO2 14 (*)    Glucose, Bld 180 (*)    BUN 26 (*)    Creatinine, Ser 1.13 (*)    AST 81 (*)    Total Bilirubin 2.4 (*)    GFR calc non Af Amer 45 (*)    GFR calc Af Amer 52 (*)    Anion gap 18 (*)    All other components within normal limits  BRAIN NATRIURETIC PEPTIDE - Abnormal; Notable for the following components:   B Natriuretic Peptide 1,712.0 (*)    All other components within normal limits   ____________________________________________  EKG  ED ECG REPORT I, Lavonia Drafts, the attending physician, personally viewed and interpreted this ECG.  Date:  07/07/2018  Rhythm: Atrial fibrillation with rapid ventricular response QRS Axis: normal Intervals: Abnormal ST/T Wave abnormalities: Nonspecific changes   ____________________________________________  RADIOLOGY  Chest x-ray demonstrates large pleural effusion ____________________________________________   PROCEDURES  Procedure(s) performed: No  Procedures   Critical Care performed: yes  CRITICAL CARE Performed by: Lavonia Drafts   Total critical care time: 35 minutes  Critical care time was exclusive of separately billable procedures and treating other patients.  Critical care was necessary to treat or prevent imminent or life-threatening deterioration.  Critical care was time spent personally by me on the following activities: development of treatment plan with patient and/or surrogate as well as nursing, discussions with consultants, evaluation of patient's response to treatment, examination of patient, obtaining history from patient or surrogate, ordering and performing treatments and interventions, ordering and review of laboratory studies, ordering and review of radiographic studies, pulse oximetry and re-evaluation of patient's condition.  ____________________________________________   INITIAL IMPRESSION / ASSESSMENT  AND PLAN / ED COURSE  Pertinent labs & imaging results that were available during my care of the patient were reviewed by me and considered in my medical decision making (see chart for details).  Patient presents with shortness of breath, tachycardia.  A. fib with RVR may be causing her shortness of breath, she is unclear if she took her Cardizem this morning.  Regardless she reports her heart has been racing for 24 hours.  She has no chest pain.  Blood pressures stable.  We will give IV Cardizem to slow down her rate.  Lung exam suspicious for effusion, she has had this before, she is on Eliquis.  Currently maintaining oxygen saturations  Chest x-ray demonstrates large pleural effusion, this will require thoracentesis.  She has not taken Eliquis in 24 hours reportedly.  I discussed with the hospitalist service for admission.  Heart rate started increasing so I ordered a Cardizem drip    ____________________________________________   FINAL CLINICAL IMPRESSION(S) / ED DIAGNOSES  Final diagnoses:  Atrial fibrillation with RVR (HCC)  Pleural effusion  Shortness of breath        Note:  This document was prepared using Dragon voice recognition software and may include unintentional dictation errors.   Lavonia Drafts, MD 07/07/18 [phone]

## 2018-07-08 ENCOUNTER — Inpatient Hospital Stay
Admit: 2018-07-08 | Discharge: 2018-07-08 | Disposition: A | Discharge status: Home / Self Care | Payer: Medicare HMO | Attending: Internal Medicine | Admitting: Internal Medicine

## 2018-07-08 ENCOUNTER — Inpatient Hospital Stay: Payer: Medicare HMO

## 2018-07-08 LAB — BASIC METABOLIC PANEL
Anion gap: 8 (ref 5–15)
BUN: 23 mg/dL (ref 8–23)
CO2: 25 mmol/L (ref 22–32)
Calcium: 8.7 mg/dL — ABNORMAL LOW (ref 8.9–10.3)
Chloride: 102 mmol/L (ref 98–111)
Creatinine, Ser: 0.84 mg/dL (ref 0.44–1.00)
GFR calc Af Amer: >60 mL/min (ref >60)
GFR calc non Af Amer: >60 mL/min (ref >60)
GLUCOSE: 115 mg/dL — AB (ref 70–99)
Potassium: 3.8 mmol/L (ref 3.5–5.1)
Sodium: 135 mmol/L (ref 135–145)

## 2018-07-08 LAB — ALBUMIN, PLEURAL OR PERITONEAL FLUID: Albumin, Fluid: 1.5 g/dL

## 2018-07-08 LAB — CBC
HCT: 43.3 % (ref 36.0–46.0)
HEMOGLOBIN: 14.1 g/dL (ref 12.0–15.0)
MCH: 30.6 pg (ref 26.0–34.0)
MCHC: 32.6 g/dL (ref 30.0–36.0)
MCV: 93.9 fL (ref 80.0–100.0)
Platelets: 176 10*3/uL (ref 150–400)
RBC: 4.61 MIL/uL (ref 3.87–5.11)
RDW: 13.2 % (ref 11.5–15.5)
WBC: 4.1 10*3/uL (ref 4.0–10.5)
nRBC: 0 % (ref 0.0–0.2)

## 2018-07-08 LAB — BODY FLUID CELL COUNT WITH DIFFERENTIAL
Eos, Fluid: 0 %
Lymphs, Fluid: 80 %
Monocyte-Macrophage-Serous Fluid: 9 %
Neutrophil Count, Fluid: 11 %
Other Cells, Fluid: 0 %
Total Nucleated Cell Count, Fluid: 496 uL

## 2018-07-08 LAB — LACTATE DEHYDROGENASE, PLEURAL OR PERITONEAL FLUID: LD, Fluid: 62 U/L — ABNORMAL HIGH (ref 3–23)

## 2018-07-08 LAB — GLUCOSE, PLEURAL OR PERITONEAL FLUID: Glucose, Fluid: 133 mg/dL

## 2018-07-08 MED ORDER — PREDNISONE 20 MG PO TABS
40.0000 mg | ORAL_TABLET | Freq: Every day | ORAL | Status: DC
  Administered 2018-07-09 – 2018-07-11 (×3): 40 mg via ORAL
  Filled 2018-07-08 (×3): qty 2

## 2018-07-08 MED ORDER — APIXABAN 5 MG PO TABS
5.0000 mg | ORAL_TABLET | Freq: Two times a day (BID) | ORAL | Status: DC
  Administered 2018-07-09 – 2018-07-11 (×5): 5 mg via ORAL
  Filled 2018-07-08 (×5): qty 1

## 2018-07-08 NOTE — Progress Notes (Signed)
*  PRELIMINARY RESULTS* Echocardiogram 2D Echocardiogram has been performed.  Autumn Bradley 07/08/2018, 10:32 AM

## 2018-07-08 NOTE — Progress Notes (Addendum)
Autumn Bradley at [place] NAME: Autumn Bradley    MR#:  [id number]  DATE OF BIRTH:  Sep 24, 1931  SUBJECTIVE:  CHIEF COMPLAINT:   Chief Complaint  Patient presents with  . Shortness of Breath  . Palpitations   The patient has no complaints, she feels better. REVIEW OF SYSTEMS:  Review of Systems  Constitutional: Positive for malaise/fatigue. Negative for chills and fever.  HENT: Negative for sore throat.   Eyes: Negative for blurred vision and double vision.  Respiratory: Negative for cough, hemoptysis, shortness of breath, wheezing and stridor.   Cardiovascular: Negative for chest pain, palpitations, orthopnea and leg swelling.  Gastrointestinal: Negative for abdominal pain, blood in stool, diarrhea, melena, nausea and vomiting.  Genitourinary: Negative for dysuria, flank pain and hematuria.  Musculoskeletal: Negative for back pain and joint pain.  Skin: Negative for rash.  Neurological: Negative for dizziness, sensory change, focal weakness, seizures, loss of consciousness, weakness and headaches.  Endo/Heme/Allergies: Negative for polydipsia.  Psychiatric/Behavioral: Negative for depression. The patient is not nervous/anxious.     DRUG ALLERGIES:  No Known Allergies VITALS:  Blood pressure 108/90, pulse 91, temperature 98.7 F (37.1 C), temperature source Oral, resp. rate 16, weight 62.8 kg, SpO2 97 %. PHYSICAL EXAMINATION:  Physical Exam Constitutional:      General: She is not in acute distress. HENT:     Head: Normocephalic.     Mouth/Throat:     Mouth: Mucous membranes are moist.  Eyes:     General: No scleral icterus.    Conjunctiva/sclera: Conjunctivae normal.     Pupils: Pupils are equal, round, and reactive to light.  Neck:     Musculoskeletal: Normal range of motion and neck supple.     Vascular: No JVD.     Trachea: No tracheal deviation.  Cardiovascular:     Rate and Rhythm: Normal rate. Rhythm irregular.   Heart sounds: Normal heart sounds. No murmur. No gallop.   Pulmonary:     Effort: Pulmonary effort is normal. No respiratory distress.     Breath sounds: No stridor. Rales present. No wheezing or rhonchi.     Comments: Diminished breath sounds on the left side Abdominal:     General: Bowel sounds are normal. There is no distension.     Palpations: Abdomen is soft.     Tenderness: There is no abdominal tenderness. There is no rebound.  Musculoskeletal: Normal range of motion.        General: No tenderness.     Right lower leg: No edema.     Left lower leg: No edema.  Skin:    Findings: No erythema or rash.  Neurological:     General: No focal deficit present.     Mental Status: She is alert and oriented to person, place, and time.     Cranial Nerves: No cranial nerve deficit.  Psychiatric:        Mood and Affect: Mood normal.    LABORATORY PANEL:  Female CBC Recent Labs  Lab 07/08/18 0659  WBC 4.1  HGB 14.1  HCT 43.3  PLT 176   ------------------------------------------------------------------------------------------------------------------ Chemistries  Recent Labs  Lab 07/07/18 0748 07/08/18 0659  NA 135 135  K 4.6 3.8  CL 103 102  CO2 14* 25  GLUCOSE 180* 115*  BUN 26* 23  CREATININE 1.13* 0.84  CALCIUM 9.3 8.7*  AST 81*  --   ALT 42  --   ALKPHOS 80  --  BILITOT 2.4*  --    RADIOLOGY:  Dg Chest Port 1 View  Result Date: 07/08/2018 CLINICAL DATA:  Post left thoracentesis EXAM: PORTABLE CHEST 1 VIEW COMPARISON:  07/07/2018 FINDINGS: The left pleural effusion has improved and there is no evidence of pneumothorax after left thoracentesis. Pericardial silhouette remains markedly enlarged compatible with a prominent pericardial effusion. Right lung is clear allowing for a skinfold towards the right lung base. IMPRESSION: No pneumothorax post left thoracentesis. Electronically Signed   By: Marybelle Killings M.D.   On: 07/08/2018 14:06   US Thoracentesis Asp Pleural Space  W/img Guide  Result Date: 07/08/2018 INDICATION: Left pleural effusion EXAM: ULTRASOUND GUIDED LEFT THORACENTESIS MEDICATIONS: None. COMPLICATIONS: None immediate. PROCEDURE: An ultrasound guided thoracentesis was thoroughly discussed with the patient and questions answered. The benefits, risks, alternatives and complications were also discussed. The patient understands and wishes to proceed with the procedure. Written consent was obtained. Ultrasound was performed to localize and mark an adequate pocket of fluid in the left chest. The area was then prepped and draped in the normal sterile fashion. 1% Lidocaine was used for local anesthesia. Under ultrasound guidance a 6 Fr Safe-T-Centesis catheter was introduced. Thoracentesis was performed. The catheter was removed and a dressing applied. FINDINGS: A total of approximately 400 cc of clear yellow fluid was removed. IMPRESSION: Successful ultrasound guided left thoracentesis yielding 400 cc of pleural fluid. Electronically Signed   By: Marybelle Killings M.D.   On: 07/08/2018 14:02   ASSESSMENT AND PLAN:   *Atrial fibrillation with rapid ventricular rate.  Likely decompensated due to acute bronchitis and left pleural effusion.   The patient is off Cardizem drip.  Continue Cardizem and Lopressor, Hold Eliquis or  Thoracentesis. Resume Eliquis tomorrow per Dr. Clayborn Bigness.  *Acute bronchitis.    Taper steroids.  Nebulizers as needed.  No need for antibiotics.  *Acute on chronic systolic congestive heart failure.  Ejection fraction 45%. Continue IV Lasix.  Follow-up echocardiogram.  Trace pericardial effusion.  Follow-up echo.  *Left pleural effusion.   Ultrasound-guided thoracentesis.  Eliquis held.  Last dose was on 07/06/2018 a.m. Successful ultrasound guided left thoracentesis yielding 400 cc of pleural fluid.  *Hypertension.  Continue Cardizem, Lopressor and Lasix.  Hold if blood pressure is low.  Generalized weakness.  Ambulate patient.  The  patient needs home health and PT due to her medical condition and generalized weakness.  I discussed with Dr. Clayborn Bigness.  All the records are reviewed and case discussed with Care Management/Social Worker. Management plans discussed with the patient, her daughter and other family members and they are in agreement.  CODE STATUS: DNR  TOTAL TIME TAKING CARE OF THIS PATIENT: 37 minutes.   More than 50% of the time was spent in counseling/coordination of care: YES  POSSIBLE D/C IN 2 DAYS, DEPENDING ON CLINICAL CONDITION.   Demetrios Loll M.D on 07/08/2018 at 4:33 PM  Between 7am to 6pm - Pager - [phone]  After 6pm go to [email or web address] - [employer] [facility]

## 2018-07-09 LAB — PATHOLOGIST SMEAR REVIEW

## 2018-07-09 LAB — ECHOCARDIOGRAM COMPLETE: Weight: 2216 oz

## 2018-07-09 MED ORDER — FUROSEMIDE 10 MG/ML IJ SOLN
20.0000 mg | Freq: Two times a day (BID) | INTRAMUSCULAR | Status: DC
  Administered 2018-07-10: 20 mg via INTRAVENOUS
  Filled 2018-07-09 (×3): qty 2

## 2018-07-09 MED ORDER — GUAIFENESIN 100 MG/5ML PO SOLN
5.0000 mL | ORAL | Status: DC | PRN
  Filled 2018-07-09: qty 5

## 2018-07-09 NOTE — Progress Notes (Signed)
Advanced Care Plan.  Purpose of Encounter: Palliative care. Parties in Attendance: The patient, her son and me. Patient's Decisional Capacity: Yes. Medical Story: Autumn Bradley  is a 85 y.o. female with a known history of chronic systolic CHF with ejection fraction 45%, atrial fibrillation on Eliquis, hypertension, CKD stage III, chronic mild pericardial effusion.  She is admitted for A. fib with RVR and acute on chronic systolic CHF.  Echocardiogram this time showed ejection fraction only 20 to 25%.  She has high risk for cardiopulmonary arrest.  I discussed with the patient and her son about her current condition, poor prognosis and palliative care.  They will consider palliative care. Goals of Care Determinations: Palliative care. Plan:  Code Status: DNR. Time spent discussing advance care planning: 17 minutes.

## 2018-07-09 NOTE — Consult Note (Signed)
Reason for Consult: Congestive heart failure shortness of breath pleural effusion pericardial effusion atrial fibrillation rapid ventricular response Referring Physician: Dr. Darvin Neighbours hospitalist Dr. Kerrin Mo primary Cardiologist Dr. Philipp Deputy is an 85 y.o. female.  HPI: Patient is a 85 year old white female history of known cardiomyopathy congestive heart failure recurrent pleural effusions known pericardial effusion presented with atrial fibrillation rapid ventricular response as well as shortness of breath dyspnea.  Evaluation by CT suggested pleural effusion bilaterally left greater than right in addition to pericardial effusion.  Patient denies any significant chest pain but had persistent dyspnea shortness of breath.  Denies any fever chills or sweats has a history of thoracentesis back in March removing over 600 cc of clear transudate thought to be related to heart failure.  Patient now presents with borderline troponins rapid ventricular response and shortness of breath dyspnea  Past Medical History:  Diagnosis Date  . Atrial fibrillation ([place])   . Cancer [facility])    SKIN CANCER  . Degenerative joint disease   . Glaucoma   . Hypercholesteremia   . Hypertension    CONTROLLED ON MEDS  . Shortness of breath dyspnea    [place]  . Wears dentures    partial upper and lower    Past Surgical History:  Procedure Laterality Date  . ABDOMINAL HYSTERECTOMY    . CARDIAC CATHETERIZATION  07/2012   [facility] - Neg for blockages  . CARDIAC ELECTROPHYSIOLOGY STUDY AND ABLATION    . CATARACT EXTRACTION W/PHACO Left 03/17/2015   Procedure: CATARACT EXTRACTION PHACO AND INTRAOCULAR LENS PLACEMENT (IOC);  Surgeon: Leandrew Koyanagi, MD;  Location: [place];  Service: Ophthalmology;  Laterality: Left;  . CATARACT EXTRACTION W/PHACO Right 04/14/2015   Procedure: CATARACT EXTRACTION PHACO AND INTRAOCULAR LENS PLACEMENT (IOC);  Surgeon: Leandrew Koyanagi, MD;  Location: [place];  Service: Ophthalmology;  Laterality: Right;  . CHOLECYSTECTOMY    . COLONOSCOPY    . TONSILLECTOMY      No family history on file.  Social History:  reports that she has never smoked. She does not have any smokeless tobacco history on file. She reports that she does not drink alcohol. No history on file for drug.  Allergies: No Known Allergies  Medications: I have reviewed the patient's current medications.  Results for orders placed or performed during the hospital encounter of 07/07/18 (from the past 48 hour(s))  Troponin I - Now Then Q6H     Status: Abnormal   Collection Time: 07/07/18  1:07 PM  Result Value Ref Range   Troponin I 0.16 (HH) <0.03 ng/mL    Comment: CRITICAL VALUE NOTED. VALUE IS CONSISTENT WITH PREVIOUSLY REPORTED/CALLED VALUE  JJB Performed at [facility], [address] [place], [place] [address]   Troponin I - Now Then Q6H     Status: Abnormal   Collection Time: 07/07/18  6:43 PM  Result Value Ref Range   Troponin I 0.18 (HH) <0.03 ng/mL    Comment: CRITICAL VALUE NOTED. VALUE IS CONSISTENT WITH PREVIOUSLY REPORTED/CALLED VALUE JJB Performed at [facility], [address] [place], [place] [address]   Basic metabolic panel     Status: Abnormal   Collection Time: 07/08/18  6:59 AM  Result Value Ref Range   Sodium 135 135 - 145 mmol/L   Potassium 3.8 3.5 - 5.1 mmol/L   Chloride 102 98 - 111 mmol/L   CO2 25 22 - 32 mmol/L   Glucose, Bld 115 (H) 70 -  99 mg/dL   BUN 23 8 - 23 mg/dL   Creatinine, Ser 0.84 0.44 - 1.00 mg/dL   Calcium 8.7 (L) 8.9 - 10.3 mg/dL   GFR calc non Af Amer >60 >60 mL/min   GFR calc Af Amer >60 >60 mL/min   Anion gap 8 5 - 15    Comment: Performed at [facility], [address] [place], [place] [address]  CBC     Status: None   Collection Time: 07/08/18  6:59 AM  Result Value Ref Range   WBC 4.1 4.0 - 10.5 K/uL   RBC 4.61 3.87 - 5.11 MIL/uL   Hemoglobin 14.1 12.0 - 15.0 g/dL    HCT 43.3 36.0 - 46.0 %   MCV 93.9 80.0 - 100.0 fL   MCH 30.6 26.0 - 34.0 pg   MCHC 32.6 30.0 - 36.0 g/dL   RDW 13.2 11.5 - 15.5 %   Platelets 176 150 - 400 K/uL   nRBC 0.0 0.0 - 0.2 %    Comment: Performed at [facility], [address] [place], [place] [address]  Lactate dehydrogenase (pleural or peritoneal fluid)     Status: Abnormal   Collection Time: 07/08/18  1:48 PM  Result Value Ref Range   LD, Fluid 62 (H) 3 - 23 U/L    Comment: (NOTE) Results should be evaluated in conjunction with serum values    Fluid Type-FLDH PLEURAL     Comment: Performed at [facility], [address] [place], [place] [address] CORRECTED ON 01/06 AT 1559: PREVIOUSLY REPORTED AS CYTOPLEU   Body fluid cell count with differential     Status: Abnormal   Collection Time: 07/08/18  1:48 PM  Result Value Ref Range   Fluid Type-FCT PLEURAL     Comment: CORRECTED ON 01/06 AT 1559: PREVIOUSLY REPORTED AS CYTOPLEU   Color, Fluid YELLOW YELLOW   Appearance, Fluid CLEAR (A) CLEAR   WBC, Fluid 496 cu mm   Neutrophil Count, Fluid 11 %   Lymphs, Fluid 80 %   Monocyte-Macrophage-Serous Fluid 9 %   Eos, Fluid 0 %   Other Cells, Fluid 0 %    Comment: Performed at [facility], [address] [place], [place] [address]  Body fluid culture     Status: None (Preliminary result)   Collection Time: 07/08/18  1:48 PM  Result Value Ref Range   Specimen Description      PLEURAL Performed at [facility], [address] [place], [place] [address]    Special Requests      NONE Performed at [facility], [address] [place], [place] [address]    Gram Stain      RARE WBC PRESENT, PREDOMINANTLY MONONUCLEAR NO ORGANISMS SEEN Performed at [facility], [place] [address], [place],  [address]    Culture PENDING    Report Status PENDING   Albumin, pleural or peritoneal fluid     Status: None   Collection Time: 07/08/18  1:48 PM  Result Value Ref  Range   Albumin, Fluid 1.5 g/dL    Comment: (NOTE) No normal range established for this test Results should be evaluated in conjunction with serum values    Fluid Type-FALB PLEURAL     Comment: Performed at [facility], [address] [place],  [address] CORRECTED ON 01/06 AT 1559: PREVIOUSLY REPORTED AS CYTOPLEU   Glucose, pleural or peritoneal fluid     Status: None   Collection Time:  07/08/18  1:48 PM  Result Value Ref Range   Glucose, Fluid 133 mg/dL    Comment: (NOTE) No normal range established for this test Results should be evaluated in conjunction with serum values    Fluid Type-FGLU PLEURAL     Comment: Performed at [facility], [address] [place], [place] [address] CORRECTED ON 01/06 AT 1559: PREVIOUSLY REPORTED AS CYTOPLEU   Pathologist smear review     Status: None   Collection Time: 07/08/18  1:48 PM  Result Value Ref Range   Path Review Cytospin of pleural fluid is reviewed.     Comment: Patient is 85 year old female admitted for CHF exacerbation with pleural effusion. Negative for malignancy. Reactive mesothelial cells and mixed inflammatory cells with a predominance of lymphocytes. Reviewed by Dellia Nims Reuel Derby, M.D. Performed at [facility], [address] [place], [place] [address]     Dg Chest Port 1 View  Result Date: 07/08/2018 CLINICAL DATA:  Post left thoracentesis EXAM: PORTABLE CHEST 1 VIEW COMPARISON:  07/07/2018 FINDINGS: The left pleural effusion has improved and there is no evidence of pneumothorax after left thoracentesis. Pericardial silhouette remains markedly enlarged compatible with a prominent pericardial effusion. Right lung is clear allowing for a skinfold towards the right lung base. IMPRESSION: No pneumothorax post left thoracentesis. Electronically Signed   By: Marybelle Killings M.D.   On: 07/08/2018 14:06   US Thoracentesis Asp Pleural Space W/img Guide  Result Date: 07/08/2018 INDICATION: Left  pleural effusion EXAM: ULTRASOUND GUIDED LEFT THORACENTESIS MEDICATIONS: None. COMPLICATIONS: None immediate. PROCEDURE: An ultrasound guided thoracentesis was thoroughly discussed with the patient and questions answered. The benefits, risks, alternatives and complications were also discussed. The patient understands and wishes to proceed with the procedure. Written consent was obtained. Ultrasound was performed to localize and mark an adequate pocket of fluid in the left chest. The area was then prepped and draped in the normal sterile fashion. 1% Lidocaine was used for local anesthesia. Under ultrasound guidance a 6 Fr Safe-T-Centesis catheter was introduced. Thoracentesis was performed. The catheter was removed and a dressing applied. FINDINGS: A total of approximately 400 cc of clear yellow fluid was removed. IMPRESSION: Successful ultrasound guided left thoracentesis yielding 400 cc of pleural fluid. Electronically Signed   By: Marybelle Killings M.D.   On: 07/08/2018 14:02    Review of Systems  Constitutional: Positive for diaphoresis and malaise/fatigue.  HENT: Positive for congestion.   Eyes: Negative.   Respiratory: Positive for cough and shortness of breath.   Cardiovascular: Positive for orthopnea, leg swelling and PND.  Gastrointestinal: Negative.   Genitourinary: Negative.   Musculoskeletal: Negative.   Skin: Negative.   Neurological: Positive for weakness.  Endo/Heme/Allergies: Negative.   Psychiatric/Behavioral: Negative.    Blood pressure 98/84, pulse 99, temperature (!) 97.3 F (36.3 C), temperature source Oral, resp. rate 18, weight 62.6 kg, SpO2 96 %. Physical Exam  Nursing note and vitals reviewed. Constitutional: She is oriented to person, place, and time. She appears well-developed and well-nourished.  HENT:  Head: Normocephalic and atraumatic.  Eyes: Pupils are equal, round, and reactive to light. Conjunctivae and EOM are normal.  Neck: Normal range of motion. Neck supple.   Cardiovascular: Normal rate and regular rhythm.  Murmur heard. Respiratory: Effort normal. She has decreased breath sounds. She has rhonchi.  GI: Soft. Bowel sounds are normal.  Musculoskeletal: Normal range of motion.  Neurological: She is alert and oriented to person, place, and time. She has normal reflexes.  Skin: Skin is warm and dry.  Psychiatric: She has a normal mood and affect.    Assessment/Plan: Shortness of breath Congestive heart failure Pleural effusion Atrial fibrillation Palpitations Hyperlipidemia Chronic renal sufficiency stage III Pericardial effusion Hypertension GERD Demand ischemia with borderline troponin . Plan Agree with admit to telemetry Follow-up EKGs and troponins Continue to hold Eliquis Recommend thoracentesis for pleural effusion Continue rate control and rhythm control for atrial fibrillation Recommend continue heart failure therapy Diuretic and beta-blocker therapy for heart failure Borderline troponins probably demand  ischemia Do not recommend a invasive strategy Agree with echocardiogram for assessment of pericardial effusion Outpatient follow-up with primary cardiologist Dr. Ubaldo Glassing as an outpatient  Doratha Mcswain D Golf 07/09/2018, 9:28 AM

## 2018-07-09 NOTE — Plan of Care (Signed)
  Problem: Education: Goal: Understanding of medication regimen will improve Outcome: Progressing   Problem: Cardiac: Goal: Ability to achieve and maintain adequate cardiopulmonary perfusion will improve Outcome: Progressing   

## 2018-07-09 NOTE — Progress Notes (Signed)
[place] NAME: Autumn Bradley    MR#:  [id number]  DATE OF BIRTH:  October 20, 1931  SUBJECTIVE:  CHIEF COMPLAINT:   Chief Complaint  Patient presents with  . Shortness of Breath  . Palpitations   The patient has no complaints.  Heart rate is better controlled. REVIEW OF SYSTEMS:  Review of Systems  Constitutional: Positive for malaise/fatigue. Negative for chills and fever.  HENT: Negative for sore throat.   Eyes: Negative for blurred vision and double vision.  Respiratory: Negative for cough, hemoptysis, shortness of breath, wheezing and stridor.   Cardiovascular: Negative for chest pain, palpitations, orthopnea and leg swelling.  Gastrointestinal: Negative for abdominal pain, blood in stool, diarrhea, melena, nausea and vomiting.  Genitourinary: Negative for dysuria, flank pain and hematuria.  Musculoskeletal: Negative for back pain and joint pain.  Skin: Negative for rash.  Neurological: Negative for dizziness, sensory change, focal weakness, seizures, loss of consciousness, weakness and headaches.  Endo/Heme/Allergies: Negative for polydipsia.  Psychiatric/Behavioral: Negative for depression. The patient is not nervous/anxious.     DRUG ALLERGIES:  No Known Allergies VITALS:  Blood pressure 92/65, pulse (!) 105, temperature (!) 97.3 F (36.3 C), temperature source Oral, resp. rate 18, weight 62.6 kg, SpO2 96 %. PHYSICAL EXAMINATION:  Physical Exam Constitutional:      General: She is not in acute distress. HENT:     Head: Normocephalic.     Mouth/Throat:     Mouth: Mucous membranes are moist.  Eyes:     General: No scleral icterus.    Conjunctiva/sclera: Conjunctivae normal.     Pupils: Pupils are equal, round, and reactive to light.  Neck:     Musculoskeletal: Normal range of motion and neck supple.     Vascular: No JVD.     Trachea: No tracheal deviation.  Cardiovascular:     Rate and Rhythm: Normal rate.  Rhythm irregular.     Heart sounds: Normal heart sounds. No murmur. No gallop.   Pulmonary:     Effort: Pulmonary effort is normal. No respiratory distress.     Breath sounds: No stridor. No wheezing, rhonchi or rales.  Abdominal:     General: Bowel sounds are normal. There is no distension.     Palpations: Abdomen is soft.     Tenderness: There is no abdominal tenderness. There is no rebound.  Musculoskeletal: Normal range of motion.        General: No tenderness.     Right lower leg: No edema.     Left lower leg: No edema.  Skin:    Findings: No erythema or rash.  Neurological:     General: No focal deficit present.     Mental Status: She is alert and oriented to person, place, and time.     Cranial Nerves: No cranial nerve deficit.  Psychiatric:        Mood and Affect: Mood normal.    LABORATORY PANEL:  Female CBC Recent Labs  Lab 07/08/18 0659  WBC 4.1  HGB 14.1  HCT 43.3  PLT 176   ------------------------------------------------------------------------------------------------------------------ Chemistries  Recent Labs  Lab 07/07/18 0748 07/08/18 0659  NA 135 135  K 4.6 3.8  CL 103 102  CO2 14* 25  GLUCOSE 180* 115*  BUN 26* 23  CREATININE 1.13* 0.84  CALCIUM 9.3 8.7*  AST 81*  --   ALT 42  --   ALKPHOS 80  --   BILITOT 2.4*  --  RADIOLOGY:  No results found. ASSESSMENT AND PLAN:   *Atrial fibrillation with rapid ventricular rate.  Likely decompensated due to acute bronchitis and left pleural effusion.   The patient is off Cardizem drip.  Continue Cardizem and Lopressor,  resume Eliquis.  *Acute bronchitis.    Taper steroids.  Nebulizers as needed.  No need for antibiotics.  *Acute on chronic systolic congestive heart failure.  Ejection fraction 20-25%. Continue IV Lasix and Lopressor if blood pressure allows.  Unable to aid ACE inhibitor due to low blood pressure.  Trace pericardial effusion. No evidence of tamponade.  *Left pleural  effusion.   Successful ultrasound guided left thoracentesis yielding 400 cc of pleural fluid.  *Hypertension.  Continue Cardizem, Lopressor and Lasix.  Hold if blood pressure is low.  Generalized weakness.  Ambulate patient.  The patient needs home health and PT due to her medical condition and generalized weakness.  I discussed with Dr. Ubaldo Glassing.  All the records are reviewed and case discussed with Care Management/Social Worker. Management plans discussed with the patient, her son and other family members and they are in agreement.  CODE STATUS: DNR  TOTAL TIME TAKING CARE OF THIS PATIENT: 26 minutes.   More than 50% of the time was spent in counseling/coordination of care: YES  POSSIBLE D/C IN 2 DAYS, DEPENDING ON CLINICAL CONDITION.   Demetrios Loll M.D on 07/09/2018 at 2:27 PM  Between 7am to 6pm - Pager - [phone]  After 6pm go to [email or web address] - [facility]

## 2018-07-09 NOTE — Progress Notes (Signed)
Pt ambulated around nurses station, HR as high as 160 bpm, resting rate today has been 100-110's at rest. Pt states "I feel great, do you want me to walk or run?" Denies any SOB, or weakness, dizziness at this time.

## 2018-07-10 ENCOUNTER — Encounter: Payer: Self-pay | Admitting: *Deleted

## 2018-07-10 DIAGNOSIS — J90 Pleural effusion, not elsewhere classified: Secondary | ICD-10-CM

## 2018-07-10 DIAGNOSIS — Z515 Encounter for palliative care: Secondary | ICD-10-CM

## 2018-07-10 DIAGNOSIS — Z7189 Other specified counseling: Secondary | ICD-10-CM

## 2018-07-10 DIAGNOSIS — I509 Heart failure, unspecified: Secondary | ICD-10-CM

## 2018-07-10 LAB — MAGNESIUM: Magnesium: 2 mg/dL (ref 1.7–2.4)

## 2018-07-10 LAB — BASIC METABOLIC PANEL
Anion gap: 8 (ref 5–15)
BUN: 27 mg/dL — AB (ref 8–23)
CO2: 25 mmol/L (ref 22–32)
Calcium: 9 mg/dL (ref 8.9–10.3)
Chloride: 102 mmol/L (ref 98–111)
Creatinine, Ser: 0.9 mg/dL (ref 0.44–1.00)
GFR calc Af Amer: >60 mL/min (ref >60)
GFR, EST NON AFRICAN AMERICAN: 59 mL/min — AB (ref >60)
Glucose, Bld: 197 mg/dL — ABNORMAL HIGH (ref 70–99)
Potassium: 4.5 mmol/L (ref 3.5–5.1)
Sodium: 135 mmol/L (ref 135–145)

## 2018-07-10 MED ORDER — METOPROLOL SUCCINATE ER 25 MG PO TB24
25.0000 mg | ORAL_TABLET | Freq: Every day | ORAL | Status: DC
  Administered 2018-07-10: 25 mg via ORAL
  Filled 2018-07-10: qty 1

## 2018-07-10 NOTE — Progress Notes (Addendum)
Pt was ambulated around the nurses station once. Oxygen was at 92-95 % and HR up at 130'-140 but no signs of distress. Pt was excorted back to the room and after a min HR  went down to 110-106 then went to 96. Will notify incoming shift. Will continue to monitor.

## 2018-07-10 NOTE — Progress Notes (Signed)
Patient ID: Autumn Bradley, female   DOB: 02-13-1932, 85 y.o.   MRN: [id number]  Chief Complaint  Patient presents with  . Shortness of Breath  . Palpitations    Referred By Dr. Bartholome Bill Reason for Referral pericardial and bilateral pleural effusions  HPI Location, Quality, Duration, Severity, Timing, Context, Modifying Factors, Associated Signs and Symptoms.  Autumn Bradley is a 84 y.o. female.  This patient is an 85 year old white female with a history of pericardial effusion and bilateral pleural effusions.  The etiology of her pleural and pericardial effusions is presumed secondary to congestive heart failure with a ejection fraction of 25%.  She was recently admitted to the hospital with increasing shortness of breath and an echocardiogram revealed a large pericardial effusion without signs of [place] nod and a subsequent chest x-ray showed a significant left pleural effusion and a much smaller right pleural effusion.  She did undergo a left-sided thoracentesis and states that her symptoms improved dramatically.  She was also placed on additional diuretics and that too has improved her shortness of breath.  I was asked to see the patient for consideration of a pericardial window and or Pleurx catheter insertion.  The patient also had a left-sided thoracentesis about 1 year ago.  She has never had a pericardial drain.  She has no history of malignancy except for some superficial squamous cell carcinomas of the skin.  She is never had radiation therapy or other types of malignancy.  Today she says she feels quite well.  She is able to get up and walk to the bathroom without shortness of breath.  She performs activities of daily living at home.  She has had a significant weight loss and does feel more fatigued now than she did over the last several years.  She attributes this to her advanced age.   Past Medical History:  Diagnosis Date  . Atrial fibrillation ([place])   . Cancer [facility])    SKIN  CANCER  . Degenerative joint disease   . Glaucoma   . Hypercholesteremia   . Hypertension    CONTROLLED ON MEDS  . Shortness of breath dyspnea    [place]ING [place]  . Wears dentures    partial upper and lower    Past Surgical History:  Procedure Laterality Date  . ABDOMINAL HYSTERECTOMY    . CARDIAC CATHETERIZATION  07/2012   [facility] - Neg for blockages  . CARDIAC ELECTROPHYSIOLOGY STUDY AND ABLATION    . CATARACT EXTRACTION W/PHACO Left 03/17/2015   Procedure: CATARACT EXTRACTION PHACO AND INTRAOCULAR LENS PLACEMENT (IOC);  Surgeon: Leandrew Koyanagi, MD;  Location: [facility];  Service: Ophthalmology;  Laterality: Left;  . CATARACT EXTRACTION W/PHACO Right 04/14/2015   Procedure: CATARACT EXTRACTION PHACO AND INTRAOCULAR LENS PLACEMENT (IOC);  Surgeon: Leandrew Koyanagi, MD;  Location: [facility];  Service: Ophthalmology;  Laterality: Right;  . CHOLECYSTECTOMY    . COLONOSCOPY    . TONSILLECTOMY      No family history on file.  Social History Social History   Tobacco Use  . Smoking status: Never Smoker  Substance Use Topics  . Alcohol use: No  . Drug use: Not on file    No Known Allergies  Current Facility-Administered Medications  Medication Dose Route Frequency Provider Last Rate Last Dose  . acetaminophen (TYLENOL) tablet 650 mg  650 mg Oral Q6H PRN Sudini, Alveta Heimlich, MD       Or  . acetaminophen (TYLENOL) suppository 650 mg  650 mg Rectal  Q6H PRN Hillary Bow, MD      . albuterol (PROVENTIL) (2.5 MG/3ML) 0.083% nebulizer solution 2.5 mg  2.5 mg Nebulization Q2H PRN Sudini, Alveta Heimlich, MD      . apixaban (ELIQUIS) tablet 5 mg  5 mg Oral BID Callwood, Dwayne D, MD   5 mg at 07/10/18 0930  . atorvastatin (LIPITOR) tablet 10 mg  10 mg Oral Daily Hillary Bow, MD   10 mg at 07/09/18 1833  . diltiazem (CARDIZEM CD) 24 hr capsule 300 mg  300 mg Oral Daily Sudini, Srikar, MD   300 mg at 07/10/18 0930  . furosemide (LASIX) injection 20 mg  20 mg Intravenous  Q12H Demetrios Loll, MD   20 mg at 07/10/18 0930  . guaiFENesin (ROBITUSSIN) 100 MG/5ML solution 100 mg  5 mL Oral Q4H PRN Demetrios Loll, MD      . latanoprost (XALATAN) 0.005 % ophthalmic solution 1 drop  1 drop Both Eyes Nightly Hillary Bow, MD   1 drop at 07/09/18 2107  . metoprolol succinate (TOPROL-XL) 24 hr tablet 25 mg  25 mg Oral Daily Teodoro Spray, MD   25 mg at 07/10/18 0930  . metoprolol tartrate (LOPRESSOR) injection 5 mg  5 mg Intravenous Q4H PRN Sudini, Alveta Heimlich, MD      . ondansetron (ZOFRAN) tablet 4 mg  4 mg Oral Q6H PRN Sudini, Alveta Heimlich, MD       Or  . ondansetron (ZOFRAN) injection 4 mg  4 mg Intravenous Q6H PRN Sudini, Srikar, MD      . pantoprazole (PROTONIX) EC tablet 40 mg  40 mg Oral Q0600 Hillary Bow, MD   40 mg at 07/10/18 0506  . polyethylene glycol (MIRALAX / GLYCOLAX) packet 17 g  17 g Oral Daily PRN Sudini, Srikar, MD      . potassium chloride (K-DUR,KLOR-CON) CR tablet 10 mEq  10 mEq Oral Q0600 Hillary Bow, MD   10 mEq at 07/10/18 0506  . predniSONE (DELTASONE) tablet 40 mg  40 mg Oral Q breakfast Demetrios Loll, MD   40 mg at 07/10/18 0849  . sodium chloride flush (NS) 0.9 % injection 3 mL  3 mL Intravenous Q12H Sudini, Alveta Heimlich, MD   3 mL at 07/10/18 0930      Review of Systems A complete review of systems was asked and was negative except for the following positive findings unexplained weight loss of about 60 pounds over the last year or 2.  Shortness of breath relieved with thoracentesis.  Blood pressure 104/78, pulse 83, temperature (!) 97.5 F (36.4 C), temperature source Oral, resp. rate 20, weight 62.5 kg, SpO2 96 %.  Physical Exam CONSTITUTIONAL:  Pleasant, well-developed, well-nourished, and in no acute distress. EYES: Pupils equal and reactive to light, Sclera non-icteric EARS, NOSE, MOUTH AND THROAT:  The oropharynx was clear.  Dentition is good repair.  Oral mucosa pink and moist. LYMPH NODES:  Lymph nodes in the neck and axillae were  normal RESPIRATORY:  Lungs were clear.  Normal respiratory effort without pathologic use of accessory muscles of respiration CARDIOVASCULAR: Heart was slightly irregular without murmurs.  There were no carotid bruits.  Heart sounds were reasonably crisp and there were no carotid bruits GI: The abdomen was soft, nontender, and nondistended. There were no palpable masses. There was no hepatosplenomegaly. There were normal bowel sounds in all quadrants. GU:  Rectal deferred.   MUSCULOSKELETAL:  Normal muscle strength and tone.  No clubbing or cyanosis.   SKIN:  There were no pathologic  skin lesions.  There were no nodules on palpation. NEUROLOGIC:  Sensation is normal.  Cranial nerves are grossly intact. PSYCH:  Oriented to person, place and time.  Mood and affect are normal.  Data Reviewed CT scans and chest x-rays  I have personally reviewed the patient's imaging, laboratory findings and medical records.    Assessment    Large pericardial and bilateral pleural effusions    Plan    I have independently reviewed the patient's x-rays.  Her pericardial silhouette is unchanged from a year ago.  There was a CT scan performed back in March 2019.  This did not reveal any evidence of malignancy but did show a large pericardial effusion.  There was a much smaller left-sided pleural effusion back on that CT.  I had a long discussion with the patient and her sons and daughter regarding the options for management.  I did discuss with them the role of Pleurx catheter insertion for her recurrent pleural effusion.  They were not very interested in that as it would involve a permanent catheter.  I also reviewed with them the indications and risks of pericardial window.  I reviewed with him the options.  At the present time they do not believe that the pericardial fluid is causing her symptoms of shortness of breath that she has felt so much better after her thoracentesis.  There does not appear to be an  obvious indication for biopsy of the pericardium.  Therefore after extensive discussion they have elected to continue to follow the patient clinically.  I am happy to see the patient at any point in time for consideration of pericardial window if it is deemed appropriate.  Thank you very much for this consultation       Nestor Lewandowsky, MD 07/10/2018, 2:56 PM   Patient ID: Autumn Bradley, female   DOB: 1932/01/07, 85 y.o.   MRN: [id number]

## 2018-07-10 NOTE — Consult Note (Signed)
Consultation Note Date: 07/10/2018   Patient Name: Autumn Bradley  DOB: [birth year]  MRN: [id number]  Age / Sex: 85 y.o., female  PCP: Adin Hector, MD Referring Physician: Gorden Harms, MD  Reason for Consultation: Establishing goals of care  HPI/Patient Profile: 85 y.o. female  with past medical history of a fib on eliquis, skin cancer, DJD, glaucoma, HLD, HTN, CHF, CKD 3 admitted on 07/07/2018 with shortness of breath. Found to have a fib RVR in ED. She was given IV cardizem.  Also c/o chest congestions and wheezing. Diagnosed with acute bronchitis.Chest x-ray revealed moderate left pleural effusion. She had a thoracentesis yielding 400 cc of pleural fluid. Found to have EF of 20-25% on echo along with trace pericardial effusion. PMT consulted for [place].  Clinical Assessment and Goals of Care: I have reviewed medical records including EPIC notes, labs and imaging, received report from RN, assessed the patient and then met with patient, daughter [name], and son, [name],  to discuss diagnosis prognosis, GOC, EOL wishes, disposition and options.  I introduced Palliative Medicine as specialized medical care for people living with serious illness. It focuses on providing relief from the symptoms and stress of a serious illness. The goal is to improve quality of life for both the patient and the family.  We discussed a brief life review of the patient. Patient has 5 children - 4 sons and 1 daughter. She is also very close with daughter-in-laws. She worked for [employer] for most of her life.   As far as functional and nutritional status, she tells me she is ambulatory - occasionally uses a walker when she is tired. Able to complete ADLs independently. She has an "okay" appetite - some recent decline. She does speak of a 70 pound weight loss over the past year. She lives with her daughter, [name].    We discussed her current illness and what it means in  the larger context of her on-going co-morbidities.   We specifically discussed her heart failure, EF of 20-25%, and pleural effusion. Patient and family have had very helpful discussions with cardiology regarding her diagnoses and express great understanding. They are open to all recommendations and treatment options from cardiology. Apparently a pericardial window was discussed and they are open to this if recommended by cardiothoracic surgery, but they are hopeful for conservative management.   I attempted to elicit values and goals of care important to the patient.  She feels as though she has a great quality of life and is hopeful to maintain this for as long as possible. She does express recognition that her health is declining and when "her time comes" she is "ready to go".  The difference between aggressive medical intervention and comfort care was considered in light of the patient's goals of care. Patient and family would like to continue all aggressive medical interventions that would help maintain current quality of life. They have set limit on care of DNR, if she is dying she desires to pass naturally. Family is supportive of her decision.   Patient is not eligible for hospice services and at this time she is dependent on cardiology recommendations to guide her care.  We discussed surrogate decision maker. Patient has named her DIL [name] as her financial power of attorney but did not realize there was a difference b/w financial and medical power of attorney. We discussed that the majority of her available children would make decisions for her if she were unable w/o a  documented HCPOA. She is okay with this - we discussed that she could designate an HCPOA if she desired. She will think about it and ask nurse for spiritual care consult if needed. Discussed with family role of surrogate decision making and making decisions that honor patient's wishes. Encouraged continued discussions among family  members about goals of care. They agree.   Questions and concerns were addressed.  The patient/family was encouraged to call with questions or concerns.   Primary Decision Maker PATIENT    SUMMARY OF RECOMMENDATIONS   - initial palliative conversation - education on diagnoses - family and patient with good understanding- relying on cardiology recommendations to guide care - accepting of all medical interventions offered  - Continue DNR status - No palliative f/u needed - please call if we can be of assistance  Code Status/Advance Care Planning:  DNR   Symptom Management:   Per primary - denies symptoms during my assessment  Prognosis:   Unable to determine  Discharge Planning: Home with Home Health      Primary Diagnoses: Present on Admission: **None**   I have reviewed the medical record, interviewed the patient and family, and examined the patient. The following aspects are pertinent.  Past Medical History:  Diagnosis Date  . Atrial fibrillation ([place])   . Cancer [facility])    SKIN CANCER  . Degenerative joint disease   . Glaucoma   . Hypercholesteremia   . Hypertension    CONTROLLED ON MEDS  . Shortness of breath dyspnea    [place]  . Wears dentures    partial upper and lower   Social History   Socioeconomic History  . Marital status: Widowed    Spouse name: Not on file  . Number of children: Not on file  . Years of education: Not on file  . Highest education level: Not on file  Occupational History  . Not on file  Social Needs  . Financial resource strain: Not on file  . Food insecurity:    Worry: Not on file    Inability: Not on file  . Transportation needs:    Medical: Not on file    Non-medical: Not on file  Tobacco Use  . Smoking status: Never Smoker  Substance and Sexual Activity  . Alcohol use: No  . Drug use: Not on file  . Sexual activity: Not on file  Lifestyle  . Physical activity:    Days per week: Not on file    Minutes  per session: Not on file  . Stress: Not on file  Relationships  . Social connections:    Talks on phone: Not on file    Gets together: Not on file    Attends religious service: Not on file    Active member of club or organization: Not on file    Attends meetings of clubs or organizations: Not on file    Relationship status: Not on file  Other Topics Concern  . Not on file  Social History Narrative  . Not on file   No family history on file. Scheduled Meds: . apixaban  5 mg Oral BID  . atorvastatin  10 mg Oral Daily  . diltiazem  300 mg Oral Daily  . furosemide  20 mg Intravenous Q12H  . latanoprost  1 drop Both Eyes Nightly  . metoprolol succinate  25 mg Oral Daily  . pantoprazole  40 mg Oral Q0600  . potassium chloride  10 mEq Oral Q0600  .  predniSONE  40 mg Oral Q breakfast  . sodium chloride flush  3 mL Intravenous Q12H   Continuous Infusions: PRN Meds:.acetaminophen **OR** acetaminophen, albuterol, guaiFENesin, metoprolol tartrate, ondansetron **OR** ondansetron (ZOFRAN) IV, polyethylene glycol No Known Allergies Review of Systems  Physical Exam Constitutional:      General: She is not in acute distress.    Appearance: She is not ill-appearing or diaphoretic.  HENT:     Head: Normocephalic and atraumatic.  Cardiovascular:     Rate and Rhythm: Normal rate. Rhythm irregular.  Pulmonary:     Effort: Pulmonary effort is normal.     Breath sounds: Normal breath sounds.  Musculoskeletal:     Right lower leg: No edema.     Left lower leg: No edema.  Skin:    General: Skin is warm and dry.     Coloration: Skin is not pale.  Neurological:     Mental Status: She is alert and oriented to person, place, and time.  Psychiatric:        Mood and Affect: Mood normal.        Behavior: Behavior normal.     Vital Signs: BP 104/78 (BP Location: Left Arm)   Pulse 83   Temp (!) 97.5 F (36.4 C) (Oral)   Resp 20   Wt 62.5 kg   SpO2 96%   BMI 22.91 kg/m  Pain Scale:  0-10   Pain Score: 0-No pain   SpO2: SpO2: 96 % O2 Device:SpO2: 96 % O2 Flow Rate: .   IO: Intake/output summary:   Intake/Output Summary (Last 24 hours) at 07/10/2018 1328 Last data filed at 07/10/2018 1017 Gross per 24 hour  Intake 120 ml  Output 550 ml  Net -430 ml    LBM: Last BM Date: 07/08/18 Baseline Weight: Weight: 62.8 kg Most recent weight: Weight: 62.5 kg     Palliative Assessment/Data: PPS 60%    Time Total: 70 minutes Greater than 50%  of this time was spent counseling and coordinating care related to the above assessment and plan.  Juel Burrow, DNP, AGNP-C Palliative Medicine Team [phone] Pager: [phone]

## 2018-07-10 NOTE — Care Management Important Message (Signed)
Copy of signed Medicare IM left with patient in room. 

## 2018-07-10 NOTE — Progress Notes (Signed)
Patient Name: Autumn Bradley Date of Encounter: 07/10/2018  Hospital Problem List     Active Problems:   CHF (congestive heart failure) (HCC)   Pleural effusion   Goals of care, counseling/discussion   Palliative care by specialist    Patient Profile     85 yo with recurrent pleural effusions and chronic pericardial effusion. No tamponade physiology. Improved with thoracentesis.  Less sob  Subjective   Less sob  Inpatient Medications    . apixaban  5 mg Oral BID  . atorvastatin  10 mg Oral Daily  . diltiazem  300 mg Oral Daily  . furosemide  20 mg Intravenous Q12H  . latanoprost  1 drop Both Eyes Nightly  . metoprolol succinate  25 mg Oral Daily  . pantoprazole  40 mg Oral Q0600  . potassium chloride  10 mEq Oral Q0600  . predniSONE  40 mg Oral Q breakfast  . sodium chloride flush  3 mL Intravenous Q12H    Vital Signs    Vitals:   07/09/18 2007 07/09/18 2242 07/10/18 0452 07/10/18 0812  BP: 113/88 100/83 116/69 104/78  Pulse: 100 95 (!) 109 83  Resp: 20     Temp: 98.1 F (36.7 C)  98.1 F (36.7 C) (!) 97.5 F (36.4 C)  TempSrc: Oral  Oral Oral  SpO2: 96%  96% 96%  Weight:   62.5 kg     Intake/Output Summary (Last 24 hours) at 07/10/2018 1427 Last data filed at 07/10/2018 1017 Gross per 24 hour  Intake 120 ml  Output 550 ml  Net -430 ml   Filed Weights   07/08/18 0444 07/09/18 0427 07/10/18 0452  Weight: 62.8 kg 62.6 kg 62.5 kg    Physical Exam    GEN: Well nourished, well developed, in no acute distress.  HEENT: normal.  Neck: Supple, no JVD, carotid bruits, or masses. Cardiac: irr, irr Respiratory:  Respirations regular and unlabored, clear to auscultation bilaterally. GI: Soft, nontender, nondistended, BS + x 4. MS: no deformity or atrophy. Skin: warm and dry, no rash. Neuro:  Strength and sensation are intact. Psych: Normal affect.  Labs    CBC Recent Labs    07/08/18 0659  WBC 4.1  HGB 14.1  HCT 43.3  MCV 93.9  PLT 185   Basic  Metabolic Panel Recent Labs    07/08/18 0659 07/10/18 0303  NA 135 135  K 3.8 4.5  CL 102 102  CO2 25 25  GLUCOSE 115* 197*  BUN 23 27*  CREATININE 0.84 0.90  CALCIUM 8.7* 9.0  MG  --  2.0   Liver Function Tests No results for input(s): AST, ALT, ALKPHOS, BILITOT, PROT, ALBUMIN in the last 72 hours. No results for input(s): LIPASE, AMYLASE in the last 72 hours. Cardiac Enzymes Recent Labs    07/07/18 1843  TROPONINI 0.18*   BNP No results for input(s): BNP in the last 72 hours. D-Dimer No results for input(s): DDIMER in the last 72 hours. Hemoglobin A1C No results for input(s): HGBA1C in the last 72 hours. Fasting Lipid Panel No results for input(s): CHOL, HDL, LDLCALC, TRIG, CHOLHDL, LDLDIRECT in the last 72 hours. Thyroid Function Tests No results for input(s): TSH, T4TOTAL, T3FREE, THYROIDAB in the last 72 hours.  Invalid input(s): FREET3  Telemetry    afib  ECG    afib  Radiology    Dg Chest Port 1 View  Result Date: 07/08/2018 CLINICAL DATA:  Post left thoracentesis EXAM: PORTABLE CHEST 1 VIEW  COMPARISON:  07/07/2018 FINDINGS: The left pleural effusion has improved and there is no evidence of pneumothorax after left thoracentesis. Pericardial silhouette remains markedly enlarged compatible with a prominent pericardial effusion. Right lung is clear allowing for a skinfold towards the right lung base. IMPRESSION: No pneumothorax post left thoracentesis. Electronically Signed   By: Marybelle Killings M.D.   On: 07/08/2018 14:06   Dg Chest Portable 1 View  Result Date: 07/07/2018 CLINICAL DATA:  Shortness of breath this morning. EXAM: PORTABLE CHEST 1 VIEW COMPARISON:  08/31/2017 and CT 09/18/2017 FINDINGS: Lungs are adequately inflated with opacification over the left mid to lower lung with blunting of the left costophrenic angle likely moderate size effusion with associated left basilar atelectasis. Right lung is clear. Moderate to severe cardiomegaly unchanged  partially due to moderate size pericardial effusion as seen on previous CT. Remainder the exam is unchanged. IMPRESSION: Moderate opacification over the left mid to lower lung suggesting moderate size effusion with associated basilar atelectasis. Stable moderate to severe cardiomegaly due in part to moderate size pericardial effusion as demonstrated on previous CT. Electronically Signed   By: Marin Olp M.D.   On: 07/07/2018 08:03   US Thoracentesis Asp Pleural Space W/img Guide  Result Date: 07/08/2018 INDICATION: Left pleural effusion EXAM: ULTRASOUND GUIDED LEFT THORACENTESIS MEDICATIONS: None. COMPLICATIONS: None immediate. PROCEDURE: An ultrasound guided thoracentesis was thoroughly discussed with the patient and questions answered. The benefits, risks, alternatives and complications were also discussed. The patient understands and wishes to proceed with the procedure. Written consent was obtained. Ultrasound was performed to localize and mark an adequate pocket of fluid in the left chest. The area was then prepped and draped in the normal sterile fashion. 1% Lidocaine was used for local anesthesia. Under ultrasound guidance a 6 Fr Safe-T-Centesis catheter was introduced. Thoracentesis was performed. The catheter was removed and a dressing applied. FINDINGS: A total of approximately 400 cc of clear yellow fluid was removed. IMPRESSION: Successful ultrasound guided left thoracentesis yielding 400 cc of pleural fluid. Electronically Signed   By: Marybelle Killings M.D.   On: 07/08/2018 14:02    Assessment & Plan    Cardiomyopathy-etiology unclear. EF down to 25% from 40%. Has chronic pericardial effusion with no tamponade physiology. Effusion is slowly increasing but has no hemodynamic compromise. Feels better after thoracentesis. Appreciate Dr. Genevive Bi help. Not a candidate for pericardial window or tap percutaneously at present. Continue with current therapy.   Signed, Javier Docker Myleen Brailsford MD 07/10/2018, 2:27 PM   Pager: [phone]

## 2018-07-10 NOTE — Progress Notes (Signed)
[facility] NAME: Autumn Bradley    MR#:  [id number]  DATE OF BIRTH:  01/27/1932  SUBJECTIVE:  Patient without complaint, son at the bedside, cardiothoracic to see to discuss whether patient may require pericardial window REVIEW OF SYSTEMS:  Review of Systems  Constitutional: Positive for malaise/fatigue. Negative for chills and fever.  HENT: Negative for sore throat.   Eyes: Negative for blurred vision and double vision.  Respiratory: Negative for cough, hemoptysis, shortness of breath, wheezing and stridor.   Cardiovascular: Negative for chest pain, palpitations, orthopnea and leg swelling.  Gastrointestinal: Negative for abdominal pain, blood in stool, diarrhea, melena, nausea and vomiting.  Genitourinary: Negative for dysuria, flank pain and hematuria.  Musculoskeletal: Negative for back pain and joint pain.  Skin: Negative for rash.  Neurological: Negative for dizziness, sensory change, focal weakness, seizures, loss of consciousness, weakness and headaches.  Endo/Heme/Allergies: Negative for polydipsia.  Psychiatric/Behavioral: Negative for depression. The patient is not nervous/anxious.     DRUG ALLERGIES:  No Known Allergies VITALS:  Blood pressure 104/78, pulse 83, temperature (!) 97.5 F (36.4 C), temperature source Oral, resp. rate 20, weight 62.5 kg, SpO2 96 %. PHYSICAL EXAMINATION:  Physical Exam Constitutional:      General: She is not in acute distress. HENT:     Head: Normocephalic.     Mouth/Throat:     Mouth: Mucous membranes are moist.  Eyes:     General: No scleral icterus.    Conjunctiva/sclera: Conjunctivae normal.     Pupils: Pupils are equal, round, and reactive to light.  Neck:     Musculoskeletal: Normal range of motion and neck supple.     Vascular: No JVD.     Trachea: No tracheal deviation.  Cardiovascular:     Rate and Rhythm: Normal rate. Rhythm irregular.     Heart sounds: Normal heart  sounds. No murmur. No gallop.   Pulmonary:     Effort: Pulmonary effort is normal. No respiratory distress.     Breath sounds: No stridor. No wheezing, rhonchi or rales.  Abdominal:     General: Bowel sounds are normal. There is no distension.     Palpations: Abdomen is soft.     Tenderness: There is no abdominal tenderness. There is no rebound.  Musculoskeletal: Normal range of motion.        General: No tenderness.     Right lower leg: No edema.     Left lower leg: No edema.  Skin:    Findings: No erythema or rash.  Neurological:     General: No focal deficit present.     Mental Status: She is alert and oriented to person, place, and time.     Cranial Nerves: No cranial nerve deficit.  Psychiatric:        Mood and Affect: Mood normal.    LABORATORY PANEL:  Female CBC Recent Labs  Lab 07/08/18 0659  WBC 4.1  HGB 14.1  HCT 43.3  PLT 176   ------------------------------------------------------------------------------------------------------------------ Chemistries  Recent Labs  Lab 07/07/18 0748  07/10/18 0303  NA 135   < > 135  K 4.6   < > 4.5  CL 103   < > 102  CO2 14*   < > 25  GLUCOSE 180*   < > 197*  BUN 26*   < > 27*  CREATININE 1.13*   < > 0.90  CALCIUM 9.3   < > 9.0  MG  --   --  2.0  AST 81*  --   --   ALT 42  --   --   ALKPHOS 80  --   --   BILITOT 2.4*  --   --    < > = values in this interval not displayed.   RADIOLOGY:  No results found. ASSESSMENT AND PLAN:  *Atrial fibrillation with rapid ventricular rate Stable Likely low-dose decompensated due to acute bronchitis and left pleural effusion   Successfully weaned off Cardizem drip, continue p.o. cardizem, Lopressor, Eliquis  *Acute pericardial effusion No evidence of tamponade Cardiothoracic surgery to see if patient is a candidate for pericardial window  *Acute bronchitis Resolving Continue prednisone taper, breathing treatments PRN No need for antibiotics  *Acute on chronic systolic  congestive heart failure Largely resolved Ejection fraction 20-25% Continue congestive heart failure protocol, Lasix, Lopressor, ACE inhibitor if blood pressure will allow    *Acute left pleural effusion Resolved Status post left thoracentesis yielding 400 cc of pleural fluid, pleural pathology was negative for malignancy  *Hypertension Stable Continue Cardizem, Lopressor and Lasix  *Generalized weakness Physical therapy to evaluate/treat  Disposition pending clearance by cardiology, cardiothoracic surgery, in 1 to 2 days barring any complications  All the records are reviewed and case discussed with Care Management/Social Worker. Management plans discussed with the patient, her son and other family members and they are in agreement.  CODE STATUS: DNR  TOTAL TIME TAKING CARE OF THIS PATIENT: 35 minutes.   More than 50% of the time was spent in counseling/coordination of care: YES  POSSIBLE D/C IN 2 DAYS, DEPENDING ON CLINICAL CONDITION.   Avel Peace Autumn Bradley M.D on 07/10/2018 at 2:12 PM  Between 7am to 6pm - Pager - [phone]  After 6pm go to [email or web address] - [facility]

## 2018-07-10 NOTE — Plan of Care (Signed)
  Problem: Health Behavior/Discharge Planning: Goal: Ability to manage health-related needs will improve Outcome: Progressing Note:  Patient to be seen by a cardiothoracic surgeon hopefully by the end of the day in relation to possibly needing to undergo a pericardial window surgical procedure. This may need to be an off-site procedure. Will continue to monitor overall progress. Wenda Low [facility]

## 2018-07-10 NOTE — Plan of Care (Signed)
  Problem: Education: Goal: Knowledge of disease or condition will improve Outcome: Progressing Goal: Understanding of medication regimen will improve Outcome: Progressing   Problem: Activity: Goal: Ability to tolerate increased activity will improve Outcome: Progressing   Problem: Education: Goal: Knowledge of General Education information will improve Description Including pain rating scale, medication(s)/side effects and non-pharmacologic comfort measures Outcome: Progressing

## 2018-07-11 MED ORDER — FUROSEMIDE 20 MG PO TABS: 20.0000 mg | ORAL_TABLET | Freq: Two times a day (BID) | ORAL | 0 refills | Status: DC

## 2018-07-11 MED ORDER — DILTIAZEM HCL ER COATED BEADS 300 MG PO CP24: 300.0000 mg | ORAL_CAPSULE | Freq: Every day | ORAL | 0 refills | Status: DC

## 2018-07-11 MED ORDER — METOPROLOL SUCCINATE ER 25 MG PO TB24: 25.0000 mg | ORAL_TABLET | Freq: Every day | ORAL | 0 refills | Status: DC

## 2018-07-11 MED ORDER — DIGOXIN 62.5 MCG PO TABS: 0.5000 mg | ORAL_TABLET | Freq: Every day | ORAL | 0 refills | Status: DC

## 2018-07-11 MED ORDER — METOPROLOL SUCCINATE ER 50 MG PO TB24: 50.0000 mg | ORAL_TABLET | Freq: Every day | ORAL | Status: DC

## 2018-07-11 MED ORDER — METOPROLOL SUCCINATE ER 25 MG PO TB24
25.0000 mg | ORAL_TABLET | Freq: Every day | ORAL | Status: DC
  Administered 2018-07-11: 25 mg via ORAL
  Filled 2018-07-11: qty 1

## 2018-07-11 MED ORDER — PREDNISONE 20 MG PO TABS: 20.0000 mg | ORAL_TABLET | Freq: Every day | ORAL | 0 refills | Status: DC

## 2018-07-11 MED ORDER — DIGOXIN 250 MCG PO TABS
0.5000 mg | ORAL_TABLET | Freq: Every day | ORAL | Status: DC
  Filled 2018-07-11: qty 2

## 2018-07-11 NOTE — Progress Notes (Addendum)
Cardiovascular and Pulmonary Nurse Navigator Note:    85 year old whose birthday is today.  Patient with PMHx of chronic systolic CHF with EF of 74%,  Atrial fibrillation on Eliquis, HTN, HLD, CKD Stage III, chronic mild pericardial effusion who presented to the ED with c/o SOB.  Patient admitted with dx of atrial fib with RVR, acute on chronic systolic CHF, acute bronchitis, and recurrent left pleural effusion. Patient is a NEVER smoker.    Patient name: Autumn Bradley  MRN: [id number]  Age: 85 y.o.  Sex: female  Vitals   BP Height Weight BSA (Calculated - sq m)  100/67 5\' 5"  (1.651 m) 62.5 kg   Study Result   Result status: Final result   Transthoracic Echocardiography  Patient:    Luvina, Poirier MR #:       [id number] Study Date: 07/08/2018 Gender:     F Age:        26 Height:     165.1 cm Weight:     62.8 kg BSA:        1.7 m^2 Pt. Status: Room:       [place], Qing Md  ADMITTING    Sudini, Harper, [place], Srikar R  PERFORMING   [place]  SONOGRAPHER  Charmayne Sheer, RDCS  cc:  ------------------------------------------------------------------- LV EF: 20% -   25%  ------------------------------------------------------------------- Indications:      CHF-Acute Diastolic (836.62).  ------------------------------------------------------------------- History:   PMH:  HCL.  Dyspnea.  Atrial fibrillation.  Risk factors:  Hypertension.  ------------------------------------------------------------------- Study Conclusions  - Left ventricle: The cavity size was mildly dilated. Wall   thickness was increased in a pattern of mild LVH. There was mild   concentric hypertrophy. Systolic function was severely reduced.   The estimated ejection fraction was in the range of 20% to 25%.   Wall motion was normal; there were no regional wall motion   abnormalities. - Mitral valve: There was mild  regurgitation. - Left atrium: The atrium was mildly dilated. - Right ventricle: The cavity size was moderately dilated. Wall   thickness was normal. - Right atrium: The atrium was mildly dilated. - Pericardium, extracardiac: There was a left pleural effusion.  Impressions:  - Severely depressed LVF   Globally depressed   EF=20-25%   Mod RVF with reduced function   Mod/Large pericardial effusion   Mostly posterior   No evidence of tamponade   Left pleural effusion.    ? EDUCATION:  Rounded on patient.  Patient lying in bed with HOB elevated.  Educational session with patient completed. Patient had previously refused to watch HF videos.   Provided patient with "Living Better with Heart Failure" packet. Briefly reviewed definition of heart failure and signs and symptoms of an exacerbation.?Explained to patient that HF is a chronic illness which requires self-assessment / self-management along with help from the cardiologist/PCP.?? ? *Reviewed importance of and reason behind checking weight daily in the AM, after using the bathroom, but before getting dressed. Patient has scales,but does not weigh daily.   Son is going to buy her scales that measure and state reading.  Again, stressed the importance of weighing daily to assess fluid status and to take action if needed.   ? *Reviewed with patient the following information: *Discussed when to call the Dr= weight gain of >2-3lb overnight of 5lb in a week,  *  Discussed yellow zone= call MD: weight gain of >2-3lb overnight of 5lb in a week, increased swelling, increased SOB when lying down, chest discomfort, dizziness, increased fatigue *Red Zone= call 911: struggle to breath, fainting or near fainting, significant chest pain   *Diet - Reviewed low sodium diet-provided handout of recommended and not recommended foods. Patient stated that she does not add salt to her food.  Patient stated, "I know chips are loaded with sodium, as I worked for  [employer] for 19 years."   ? *Discussed fluid intake with patient as well. Patient is currently on a 1500 ml fluid restriction.  Demonstrated this volume using the bedside water pitcher.   ? *Instructed patient to take medications as prescribed for heart failure. Explained briefly why pt is on the medications (either make you feel better, live longer or keep you out of the hospital) and discussed monitoring and side effects.  ? *Discussed exercise / activity.  Patient ambulates with walker at home.  Patient is very independent and continues to drive. Patient has declined any home health services. Encouraged patient to remain as active as possible.   ? *Smoking Cessation- Patient is a NEVER smoker.? ? *[facility] - Explained the purpose of the HF Clinic. Explained to patient the HF Clinic does not replace PCP nor Cardiologist, but is an additional resource to helping patient manage heart failure at home. Patient does not wish to be followed in the [facility]. As a result, the appointment which had been made for the patient in the [facility] was cancelled per patient's request.   Again, the 5 Steps to Living Better with Heart Failure were reviewed with patient.   Patient also provided with the [place] published by [employer].   ? Patient thanked me for providing the above information. ? ? Roanna Epley, RN, BSN, [facility] - [facility]? [facility]  Cardiovascular &?Pulmonary Nurse Navigator  Direct Line: [phone]  Department Phone #: [phone] Fax: [phone]? Email Address: [email or web address]

## 2018-07-11 NOTE — Evaluation (Signed)
Physical Therapy Evaluation Patient Details Name: Autumn Bradley MRN: [id number] DOB: 1931-10-09 Today's Date: 07/11/2018   History of Present Illness  85 y.o. female with a known history of chronic systolic CHF with ejection fraction 45%, atrial fibrillation on Eliquis, hypertension, CKD stage III, chronic mild pericardial effusion presents to the emergency room complaining of shortness of breath. Was found to have atrial fibrillation with rapid ventricular rate.  Given Cardizem bolus and started on Cardizem drip.   Pt had thorocentesis drawing off 400CC.  Clinical Impression  Pt showed good confidence, strength and safety with all aspects of PT exam.  Her only issues was increased HR with modest activity (quickly up to the 130s from 80s at rest).  She reported no significant fatigue with the effort and generally feels nearly at her baseline.  No further PT needs once medically cleared for d/c.   Follow Up Recommendations No PT follow up    Equipment Recommendations       Recommendations for Other Services       Precautions / Restrictions Precautions Precautions: Fall Restrictions Weight Bearing Restrictions: No      Mobility  Bed Mobility Overal bed mobility: Independent             General bed mobility comments: Pt able to get up to sitting EOB w/o assist  Transfers Overall transfer level: Modified independent Equipment used: Rolling walker (2 wheeled)             General transfer comment: Pt able to rise to standing w/o assist, good confidence with no safety issues  Ambulation/Gait Ambulation/Gait assistance: Modified independent (Device/Increase time) Gait Distance (Feet): 50 Feet Assistive device: Rolling walker (2 wheeled)       General Gait Details: Pt with no LOBs, safety issues, or need for education/gait training, however HR increased quickly up to the 130s with modest effort.  Cardiologist notified.  Stairs            Wheelchair Mobility     Modified Rankin (Stroke Patients Only)       Balance Overall balance assessment: Independent                                           Pertinent Vitals/Pain Pain Assessment: No/denies pain    Home Living Family/patient expects to be discharged to:: Private residence Living Arrangements: Children             Home Equipment: Environmental consultant - 4 wheels      Prior Function Level of Independence: Independent with assistive device(s)         Comments: Pt reports that she is able to be relatively active     Hand Dominance        Extremity/Trunk Assessment   Upper Extremity Assessment Upper Extremity Assessment: Overall WFL for tasks assessed    Lower Extremity Assessment Lower Extremity Assessment: Overall WFL for tasks assessed       Communication   Communication: No difficulties  Cognition Arousal/Alertness: Awake/alert Behavior During Therapy: WFL for tasks assessed/performed Overall Cognitive Status: Within Functional Limits for tasks assessed                                        General Comments      Exercises     Assessment/Plan  PT Assessment Patent does not need any further PT services  PT Problem List         PT Treatment Interventions      PT Goals (Current goals can be found in the Care Plan section)  Acute Rehab PT Goals Patient Stated Goal: go home PT Goal Formulation: All assessment and education complete, DC therapy    Frequency     Barriers to discharge        Co-evaluation               AM-PAC PT "6 Clicks" Mobility  Outcome Measure Help needed turning from your back to your side while in a flat bed without using bedrails?: None Help needed moving from lying on your back to sitting on the side of a flat bed without using bedrails?: None Help needed moving to and from a bed to a chair (including a wheelchair)?: None Help needed standing up from a chair using your arms (e.g.,  wheelchair or bedside chair)?: None Help needed to walk in hospital room?: None Help needed climbing 3-5 steps with a railing? : None 6 Click Score: 24    End of Session Equipment Utilized During Treatment: Gait belt Activity Tolerance: Patient tolerated treatment well Patient left: with chair alarm set;with call bell/phone within reach;with family/visitor present Nurse Communication: Mobility status PT Visit Diagnosis: Difficulty in walking, not elsewhere classified (R26.2)    Time: 7048-8891 PT Time Calculation (min) (ACUTE ONLY): 19 min   Charges:   PT Evaluation $PT Eval Low Complexity: 1 Low          Kreg Shropshire, DPT 07/11/2018, 10:55 AM

## 2018-07-11 NOTE — Progress Notes (Signed)
Patient Name: Autumn Bradley Date of Encounter: 07/11/2018  Hospital Problem List     Active Problems:   CHF (congestive heart failure) (HCC)   Recurrent left pleural effusion   Goals of care, counseling/discussion   Palliative care by specialist    Patient Profile     85 year old female whose birthday is today.  Doing better and anxious to go home.  Subjective   Less shortness of breath.  Inpatient Medications    . apixaban  5 mg Oral BID  . atorvastatin  10 mg Oral Daily  . diltiazem  300 mg Oral Daily  . furosemide  20 mg Intravenous Q12H  . latanoprost  1 drop Both Eyes Nightly  . metoprolol succinate  25 mg Oral Daily  . pantoprazole  40 mg Oral Q0600  . potassium chloride  10 mEq Oral Q0600  . predniSONE  40 mg Oral Q breakfast  . sodium chloride flush  3 mL Intravenous Q12H    Vital Signs    Vitals:   07/10/18 2035 07/10/18 2140 07/11/18 0411 07/11/18 0817  BP: 110/76 99/71 119/75 105/82  Pulse: 91 85 87 84  Resp:      Temp: 97.9 F (36.6 C)  98.1 F (36.7 C) 98.3 F (36.8 C)  TempSrc: Oral  Oral   SpO2: 95% 95% 98% 97%  Weight:   62.5 kg   Height:        Intake/Output Summary (Last 24 hours) at 07/11/2018 0929 Last data filed at 07/11/2018 0643 Gross per 24 hour  Intake 361 ml  Output 500 ml  Net -139 ml   Filed Weights   07/09/18 0427 07/10/18 0452 07/11/18 0411  Weight: 62.6 kg 62.5 kg 62.5 kg    Physical Exam    GEN: Well nourished, well developed, in no acute distress.  HEENT: normal.  Neck: Supple, no JVD, carotid bruits, or masses. Cardiac: RRR, no murmurs, rubs, or gallops. No clubbing, cyanosis, edema.  Radials/DP/PT 2+ and equal bilaterally.  Respiratory:  Respirations regular and unlabored, clear to auscultation bilaterally. GI: Soft, nontender, nondistended, BS + x 4. MS: no deformity or atrophy. Skin: warm and dry, no rash. Neuro:  Strength and sensation are intact. Psych: Normal affect.  Labs    CBC No results for  input(s): WBC, NEUTROABS, HGB, HCT, MCV, PLT in the last 72 hours. Basic Metabolic Panel Recent Labs    07/10/18 0303  NA 135  K 4.5  CL 102  CO2 25  GLUCOSE 197*  BUN 27*  CREATININE 0.90  CALCIUM 9.0  MG 2.0   Liver Function Tests No results for input(s): AST, ALT, ALKPHOS, BILITOT, PROT, ALBUMIN in the last 72 hours. No results for input(s): LIPASE, AMYLASE in the last 72 hours. Cardiac Enzymes No results for input(s): CKTOTAL, CKMB, CKMBINDEX, TROPONINI in the last 72 hours. BNP No results for input(s): BNP in the last 72 hours. D-Dimer No results for input(s): DDIMER in the last 72 hours. Hemoglobin A1C No results for input(s): HGBA1C in the last 72 hours. Fasting Lipid Panel No results for input(s): CHOL, HDL, LDLCALC, TRIG, CHOLHDL, LDLDIRECT in the last 72 hours. Thyroid Function Tests No results for input(s): TSH, T4TOTAL, T3FREE, THYROIDAB in the last 72 hours.  Invalid input(s): FREET3  Telemetry    Atrial fibrillation with variable ventricular response  ECG    Atrial fibrillation  Radiology    Dg Chest Port 1 View  Result Date: 07/08/2018 CLINICAL DATA:  Post left thoracentesis EXAM: PORTABLE  CHEST 1 VIEW COMPARISON:  07/07/2018 FINDINGS: The left pleural effusion has improved and there is no evidence of pneumothorax after left thoracentesis. Pericardial silhouette remains markedly enlarged compatible with a prominent pericardial effusion. Right lung is clear allowing for a skinfold towards the right lung base. IMPRESSION: No pneumothorax post left thoracentesis. Electronically Signed   By: Marybelle Killings M.D.   On: 07/08/2018 14:06   Dg Chest Portable 1 View  Result Date: 07/07/2018 CLINICAL DATA:  Shortness of breath this morning. EXAM: PORTABLE CHEST 1 VIEW COMPARISON:  08/31/2017 and CT 09/18/2017 FINDINGS: Lungs are adequately inflated with opacification over the left mid to lower lung with blunting of the left costophrenic angle likely moderate size  effusion with associated left basilar atelectasis. Right lung is clear. Moderate to severe cardiomegaly unchanged partially due to moderate size pericardial effusion as seen on previous CT. Remainder the exam is unchanged. IMPRESSION: Moderate opacification over the left mid to lower lung suggesting moderate size effusion with associated basilar atelectasis. Stable moderate to severe cardiomegaly due in part to moderate size pericardial effusion as demonstrated on previous CT. Electronically Signed   By: Marin Olp M.D.   On: 07/07/2018 08:03   US Thoracentesis Asp Pleural Space W/img Guide  Result Date: 07/08/2018 INDICATION: Left pleural effusion EXAM: ULTRASOUND GUIDED LEFT THORACENTESIS MEDICATIONS: None. COMPLICATIONS: None immediate. PROCEDURE: An ultrasound guided thoracentesis was thoroughly discussed with the patient and questions answered. The benefits, risks, alternatives and complications were also discussed. The patient understands and wishes to proceed with the procedure. Written consent was obtained. Ultrasound was performed to localize and mark an adequate pocket of fluid in the left chest. The area was then prepped and draped in the normal sterile fashion. 1% Lidocaine was used for local anesthesia. Under ultrasound guidance a 6 Fr Safe-T-Centesis catheter was introduced. Thoracentesis was performed. The catheter was removed and a dressing applied. FINDINGS: A total of approximately 400 cc of clear yellow fluid was removed. IMPRESSION: Successful ultrasound guided left thoracentesis yielding 400 cc of pleural fluid. Electronically Signed   By: Marybelle Killings M.D.   On: 07/08/2018 14:02    Assessment & Plan    Atrial fibrillation-variable ventricular response.  We will continue with Cardizem and metoprolol at current dose.  Would continue with Eliquis at 5 mg twice daily for anticoagulation, diltiazem at 300 mg daily and metoprolol succinate 25 mg daily for rate control.  Should be able to  be discharged today from a cardiac standpoint.  Renal function is stabilized with a creatinine of 0.9 GFR of 59.  Pericardial effusion-appreciate Dr. Gerome Apley input.  Not a candidate for pericardial window or pericardiocentesis at present.  We will continue to follow   Pleural effusions-improved clinically status post thoracentesis.  We will continue to follow. Signed, Javier Docker Kenzley Ke MD 07/11/2018, 9:29 AM  Pager: [phone]

## 2018-07-11 NOTE — Discharge Summary (Signed)
[facility] NAME: Autumn Bradley    MR#:  [id number]  DATE OF BIRTH:  11-28-1931  DATE OF ADMISSION:  07/07/2018 ADMITTING PHYSICIAN: Hillary Bow, MD  DATE OF DISCHARGE: No discharge date for patient encounter.  PRIMARY CARE PHYSICIAN: Adin Hector, MD    ADMISSION DIAGNOSIS:  Shortness of breath [R06.02] Pleural effusion [J90] Atrial fibrillation with RVR (HCC) [I48.91] Recurrent left pleural effusion [J90]  DISCHARGE DIAGNOSIS:  Active Problems:   CHF (congestive heart failure) (HCC)   Recurrent left pleural effusion   Goals of care, counseling/discussion   Palliative care by specialist   SECONDARY DIAGNOSIS:   Past Medical History:  Diagnosis Date  . Atrial fibrillation ([place])   . Cancer [facility])    SKIN CANCER  . Degenerative joint disease   . Glaucoma   . Hypercholesteremia   . Hypertension    CONTROLLED ON MEDS  . Shortness of breath dyspnea    [place]  . Wears dentures    partial upper and lower    HOSPITAL COURSE:  *Atrial fibrillation with rapid ventricular rate Stable Likely low-dose decompensated due to acute bronchitis and left pleural effusion  Successfully weaned off Cardizem drip, continue p.o. cardizem, Lopressor,Eliquis, low-dose digoxin added given continue severe heart rate into the 130s/140s with ambulation/activity, to follow-up with cardiology in 1 to 2 weeks for reevaluation status post discharge  *Acute pericardial effusion No evidence of tamponade Cardiothoracic surgery did see patient while in house-no indication for pericardial window  *Acute bronchitis Resolving Continue prednisone taper, breathing treatments PRN No need for antibiotics  *Acute on chronic systolic congestive heart failure Largely resolved Ejection fraction 20-25% Continue congestive heart failure protocol, Lasix, Lopressor, ACE inhibitor if blood pressure will allow    *Acute left  pleural effusion Resolved Status post left thoracentesis yielding 400 cc of pleural fluid, pleural pathology was negative for malignancy  *Hypertension Stable Continue Cardizem, Lopressor and Lasix  *Generalized weakness Physical therapy to evaluate/treat  DISCHARGE CONDITIONS:   stable  CONSULTS OBTAINED:  Treatment Team:  Teodoro Spray, MD Yolonda Kida, MD Nestor Lewandowsky, MD  DRUG ALLERGIES:  No Known Allergies  DISCHARGE MEDICATIONS:   Allergies as of 07/11/2018   No Known Allergies     Medication List    STOP taking these medications   diltiazem 300 MG 24 hr capsule Commonly known as:  TIAZAC   metoprolol tartrate 25 MG tablet Commonly known as:  LOPRESSOR     TAKE these medications   atorvastatin 10 MG tablet Commonly known as:  LIPITOR Take 10 mg by mouth daily. PM   CALCIUM 600 + D PO Take by mouth. AM   Digoxin 62.5 MCG Tabs Take 0.5 mg by mouth daily.   diltiazem 300 MG 24 hr capsule Commonly known as:  CARDIZEM CD Take 1 capsule (300 mg total) by mouth daily. Start taking on:  July 12, 2018   ELIQUIS 5 MG Tabs tablet Generic drug:  apixaban Take 5 mg by mouth 2 (two) times daily. BREAKFAST AND BEDTIME   furosemide 20 MG tablet Commonly known as:  LASIX Take 1 tablet (20 mg total) by mouth 2 (two) times daily. What changed:    when to take this  reasons to take this   lisinopril 2.5 MG tablet Commonly known as:  PRINIVIL,ZESTRIL Take 2.5 mg by mouth daily. AM   metoprolol succinate 25 MG 24 hr tablet Commonly known as:  TOPROL-XL  Take 1 tablet (25 mg total) by mouth daily. Start taking on:  July 12, 2018   OMEGA-3 FISH OIL PO Take 300 mg by mouth. 2 A DAY   pantoprazole 40 MG tablet Commonly known as:  PROTONIX Take 40 mg by mouth daily at 6 (six) AM.   potassium chloride 10 MEQ tablet Commonly known as:  K-DUR,KLOR-CON Take 10 mEq by mouth daily at 6 (six) AM.   predniSONE 20 MG tablet Commonly known as:   DELTASONE Take 1 tablet (20 mg total) by mouth daily with breakfast. Start taking on:  July 12, 2018   Vitamin D3 50 MCG (2000 UT) capsule Take 2,000 Units by mouth daily at 6 (six) AM.   XALATAN 0.005 % ophthalmic solution Generic drug:  latanoprost Apply 1 drop to eye Nightly.        DISCHARGE INSTRUCTIONS:  If you experience worsening of your admission symptoms, develop shortness of breath, life threatening emergency, suicidal or homicidal thoughts you must seek medical attention immediately by calling 911 or calling your MD immediately  if symptoms less severe.  You Must read complete instructions/literature along with all the possible adverse reactions/side effects for all the Medicines you take and that have been prescribed to you. Take any new Medicines after you have completely understood and accept all the possible adverse reactions/side effects.   Please note  You were cared for by a hospitalist during your hospital stay. If you have any questions about your discharge medications or the care you received while you were in the hospital after you are discharged, you can call the unit and asked to speak with the hospitalist on call if the hospitalist that took care of you is not available. Once you are discharged, your primary care physician will handle any further medical issues. Please note that NO REFILLS for any discharge medications will be authorized once you are discharged, as it is imperative that you return to your primary care physician (or establish a relationship with a primary care physician if you do not have one) for your aftercare needs so that they can reassess your need for medications and monitor your lab values.    Today   CHIEF COMPLAINT:   Chief Complaint  Patient presents with  . Shortness of Breath  . Palpitations    HISTORY OF PRESENT ILLNESS:  85 y.o. female with a known history of chronic systolic CHF with ejection fraction 45%, atrial  fibrillation on Eliquis, hypertension, CKD stage III, chronic mild pericardial effusion presents to the emergency room complaining of shortness of breath since yesterday.  Here in the ER patient was found to have atrial fibrillation with rapid ventricular rate.  Given Cardizem bolus and started on Cardizem drip.  Patient also felt congestion in her chest and coughing with some wheezing since 2 days. Chest x-ray shows moderate pleural effusion on the left side.  She has had prior left pleural effusion needing thoracentesis in March 2019 where 600 mL of transudate of fluid was removed.  She also had a large pericardial effusion diagnosed on CAT scan which seem to have been over estimated on CT scan as an echocardiogram showed small pericardial effusion.  VITAL SIGNS:  Blood pressure 100/67, pulse 99, temperature 98.3 F (36.8 C), resp. rate 20, height 5\' 5"  (1.651 m), weight 62.5 kg, SpO2 97 %.  I/O:    Intake/Output Summary (Last 24 hours) at 07/11/2018 1159 Last data filed at 07/11/2018 1113 Gross per 24 hour  Intake 361 ml  Output 800 ml  Net -439 ml    PHYSICAL EXAMINATION:  GENERAL:  85 y.o.-year-old patient lying in the bed with no acute distress.  EYES: Pupils equal, round, reactive to light and accommodation. No scleral icterus. Extraocular muscles intact.  HEENT: Head atraumatic, normocephalic. Oropharynx and nasopharynx clear.  NECK:  Supple, no jugular venous distention. No thyroid enlargement, no tenderness.  LUNGS: Normal breath sounds bilaterally, no wheezing, rales,rhonchi or crepitation. No use of accessory muscles of respiration.  CARDIOVASCULAR: S1, S2 normal. No murmurs, rubs, or gallops.  ABDOMEN: Soft, non-tender, non-distended. Bowel sounds present. No organomegaly or mass.  EXTREMITIES: No pedal edema, cyanosis, or clubbing.  NEUROLOGIC: Cranial nerves II through XII are intact. Muscle strength 5/5 in all extremities. Sensation intact. Gait not checked.  PSYCHIATRIC: The  patient is alert and oriented x 3.  SKIN: No obvious rash, lesion, or ulcer.   DATA REVIEW:   CBC Recent Labs  Lab 07/08/18 0659  WBC 4.1  HGB 14.1  HCT 43.3  PLT 176    Chemistries  Recent Labs  Lab 07/07/18 0748  07/10/18 0303  NA 135   < > 135  K 4.6   < > 4.5  CL 103   < > 102  CO2 14*   < > 25  GLUCOSE 180*   < > 197*  BUN 26*   < > 27*  CREATININE 1.13*   < > 0.90  CALCIUM 9.3   < > 9.0  MG  --   --  2.0  AST 81*  --   --   ALT 42  --   --   ALKPHOS 80  --   --   BILITOT 2.4*  --   --    < > = values in this interval not displayed.    Cardiac Enzymes Recent Labs  Lab 07/07/18 1843  TROPONINI 0.18*    Microbiology Results  Results for orders placed or performed during the hospital encounter of 07/07/18  Body fluid culture     Status: None (Preliminary result)   Collection Time: 07/08/18  1:48 PM  Result Value Ref Range Status   Specimen Description   Final    PLEURAL Performed at [facility], [address], [place], [place] [address]    Special Requests   Final    NONE Performed at [facility], [address] [place], [place] [address]    Gram Stain   Final    RARE WBC PRESENT, PREDOMINANTLY MONONUCLEAR NO ORGANISMS SEEN    Culture   Final    NO GROWTH 3 DAYS Performed at [facility], [place] [address] [place], [place] [address]    Report Status PENDING  Incomplete    RADIOLOGY:  No results found.  EKG:   Orders placed or performed during the hospital encounter of 07/07/18  . ED EKG  . ED EKG  . EKG 12-Lead  . EKG 12-Lead      Management plans discussed with the patient, family and they are in agreement.  CODE STATUS:     Code Status Orders  (From admission, onward)         Start     Ordered   07/07/18 1015  Do not attempt resuscitation (DNR)  Continuous    Question Answer Comment  In the event of cardiac or respiratory ARREST Do not call a "code blue"   In the event of cardiac or  respiratory ARREST Do not perform Intubation,  CPR, defibrillation or ACLS   In the event of cardiac or respiratory ARREST Use medication by any route, position, wound care, and other measures to relive pain and suffering. May use oxygen, suction and manual treatment of airway obstruction as needed for comfort.      07/07/18 1014        Code Status History    Date Active Date Inactive Code Status Order ID Comments User Context   07/07/2018 0907 07/07/2018 1014 Full Code [id number]  Hillary Bow, MD ED      TOTAL TIME TAKING CARE OF THIS PATIENT: 35 minutes.    Avel Peace Salary M.D on 07/11/2018 at 11:59 AM  Between 7am to 6pm - Pager - [phone]  After 6pm go to [email or web address] - password [email or web address] [facility]  Office  [phone]  CC: Primary care physician; Adin Hector, MD   Note: This dictation was prepared with Dragon dictation along with smaller phrase technology. Any transcriptional errors that result from this process are unintentional.

## 2018-07-11 NOTE — Progress Notes (Signed)
Patient discharged to home.  Tele and IV d/c'd prior to discharge.  Patient verbalizes understanding of d/c paperwork.  Belongings sent home with patient.

## 2018-07-11 NOTE — Plan of Care (Signed)
Still afib but rate controlled overnight, unable to receive dose of IV lasix due to low BP Problem: Activity: Goal: Ability to tolerate increased activity will improve Outcome: Progressing   Problem: Activity: Goal: Risk for activity intolerance will decrease Outcome: Progressing Note:  Up in room with 1 assist   Problem: Elimination: Goal: Will not experience complications related to urinary retention Outcome: Progressing   Problem: Safety: Goal: Ability to remain free from injury will improve Outcome: Progressing   Problem: Education: Goal: Knowledge of General Education information will improve Description Including pain rating scale, medication(s)/side effects and non-pharmacologic comfort measures Outcome: Completed/Met

## 2018-07-11 NOTE — Care Management Note (Signed)
Case Management Note  Patient Details  Name: Autumn Bradley MRN: [id number] Date of Birth: 10/15/1931  Subjective/Objective:      Patient is from home daughter lives her.  Admitted with CHF, left pleural effusion, thoracentesis.   Has a scale at home but does not weigh daily explained importance of daily weights.  Her son is planning on getting her a scale that talks.  She uses a walker at home.  Very independent at this time; continues to drive. Current with PCP.  Denies difficulties obtaining medications or with medical care. Uses [facility] on [address].  Declines any home health services at this time.  Not a current patient at the heart failure clinic.  No further needs at this time.             Action/Plan:   Expected Discharge Date:                  Expected Discharge Plan:  [place]  In-House Referral:     Discharge planning Services  CM Consult  Post Acute Care Choice:    Choice offered to:     DME Arranged:    DME Agency:     HH Arranged:    [facility] Agency:     Status of Service:  Completed, signed off  If discussed at [employer] of Stay Meetings, dates discussed:    Additional Comments:  Elza Rafter, RN 07/11/2018, 11:25 AM

## 2018-07-12 LAB — BODY FLUID CULTURE: Culture: NO GROWTH

## 2018-07-16 ENCOUNTER — Ambulatory Visit: Payer: Medicare HMO | Admitting: Family

## 2018-07-18 DIAGNOSIS — J90 Pleural effusion, not elsewhere classified: Secondary | ICD-10-CM | POA: Diagnosis not present

## 2018-07-18 DIAGNOSIS — I313 Pericardial effusion (noninflammatory): Secondary | ICD-10-CM | POA: Diagnosis not present

## 2018-07-18 DIAGNOSIS — I4819 Other persistent atrial fibrillation: Secondary | ICD-10-CM | POA: Diagnosis not present

## 2018-07-18 DIAGNOSIS — N183 Chronic kidney disease, stage 3 (moderate): Secondary | ICD-10-CM | POA: Diagnosis not present

## 2018-07-18 DIAGNOSIS — I700 Atherosclerosis of aorta: Secondary | ICD-10-CM | POA: Diagnosis not present

## 2018-07-18 DIAGNOSIS — I509 Heart failure, unspecified: Secondary | ICD-10-CM | POA: Diagnosis not present

## 2018-07-18 DIAGNOSIS — I5042 Chronic combined systolic (congestive) and diastolic (congestive) heart failure: Secondary | ICD-10-CM | POA: Diagnosis not present

## 2018-07-18 DIAGNOSIS — E1121 Type 2 diabetes mellitus with diabetic nephropathy: Secondary | ICD-10-CM | POA: Diagnosis not present

## 2018-07-18 DIAGNOSIS — I10 Essential (primary) hypertension: Secondary | ICD-10-CM | POA: Diagnosis not present

## 2018-07-26 DIAGNOSIS — I5042 Chronic combined systolic (congestive) and diastolic (congestive) heart failure: Secondary | ICD-10-CM | POA: Diagnosis not present

## 2018-07-26 DIAGNOSIS — I4819 Other persistent atrial fibrillation: Secondary | ICD-10-CM | POA: Diagnosis not present

## 2018-08-15 DIAGNOSIS — H40003 Preglaucoma, unspecified, bilateral: Secondary | ICD-10-CM | POA: Diagnosis not present

## 2018-08-26 DIAGNOSIS — I5042 Chronic combined systolic (congestive) and diastolic (congestive) heart failure: Secondary | ICD-10-CM | POA: Diagnosis not present

## 2018-08-26 DIAGNOSIS — I10 Essential (primary) hypertension: Secondary | ICD-10-CM | POA: Diagnosis not present

## 2018-08-26 DIAGNOSIS — E1121 Type 2 diabetes mellitus with diabetic nephropathy: Secondary | ICD-10-CM | POA: Diagnosis not present

## 2018-08-26 DIAGNOSIS — I4819 Other persistent atrial fibrillation: Secondary | ICD-10-CM | POA: Diagnosis not present

## 2018-09-09 DIAGNOSIS — I700 Atherosclerosis of aorta: Secondary | ICD-10-CM | POA: Diagnosis not present

## 2018-09-09 DIAGNOSIS — I10 Essential (primary) hypertension: Secondary | ICD-10-CM | POA: Diagnosis not present

## 2018-09-09 DIAGNOSIS — E042 Nontoxic multinodular goiter: Secondary | ICD-10-CM | POA: Diagnosis not present

## 2018-09-09 DIAGNOSIS — I313 Pericardial effusion (noninflammatory): Secondary | ICD-10-CM | POA: Diagnosis not present

## 2018-09-09 DIAGNOSIS — Z0000 Encounter for general adult medical examination without abnormal findings: Secondary | ICD-10-CM | POA: Diagnosis not present

## 2018-09-09 DIAGNOSIS — I4819 Other persistent atrial fibrillation: Secondary | ICD-10-CM | POA: Diagnosis not present

## 2018-09-09 DIAGNOSIS — M47816 Spondylosis without myelopathy or radiculopathy, lumbar region: Secondary | ICD-10-CM | POA: Diagnosis not present

## 2018-09-09 DIAGNOSIS — I5042 Chronic combined systolic (congestive) and diastolic (congestive) heart failure: Secondary | ICD-10-CM | POA: Diagnosis not present

## 2018-09-09 DIAGNOSIS — E1121 Type 2 diabetes mellitus with diabetic nephropathy: Secondary | ICD-10-CM | POA: Diagnosis not present

## 2018-10-31 DIAGNOSIS — I700 Atherosclerosis of aorta: Secondary | ICD-10-CM | POA: Diagnosis not present

## 2018-10-31 DIAGNOSIS — N183 Chronic kidney disease, stage 3 (moderate): Secondary | ICD-10-CM | POA: Diagnosis not present

## 2018-10-31 DIAGNOSIS — I4819 Other persistent atrial fibrillation: Secondary | ICD-10-CM | POA: Diagnosis not present

## 2018-10-31 DIAGNOSIS — I10 Essential (primary) hypertension: Secondary | ICD-10-CM | POA: Diagnosis not present

## 2018-10-31 DIAGNOSIS — I5042 Chronic combined systolic (congestive) and diastolic (congestive) heart failure: Secondary | ICD-10-CM | POA: Diagnosis not present

## 2019-01-05 IMAGING — US US THORACENTESIS ASP PLEURAL SPACE W/IMG GUIDE
1 series · 1 of 1 positions shown · non-contrast
Comparison: none

INDICATION: Patient with history of cough, dyspnea, left pleural effusion.
Request made for therapeutic left thoracentesis.

[Series 1: us thoracentesis asp pleural space w/img guide · 0.26mm/px · 1 of 1 slices shown]
[im 1/1]
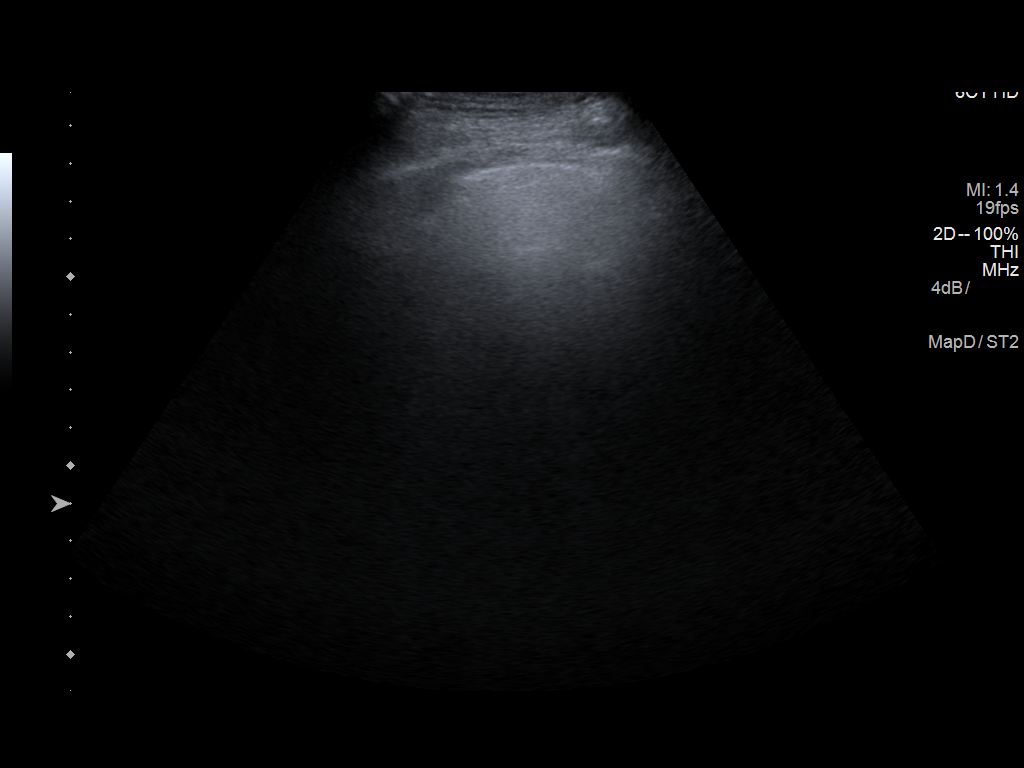

[1 of 1 positions shown; findings below may reference images not displayed]

EXAM:
ULTRASOUND GUIDED THERAPEUTIC LEFT THORACENTESIS

MEDICATIONS:
None

COMPLICATIONS:
None immediate.

PROCEDURE:
An ultrasound guided thoracentesis was thoroughly discussed with the
patient and questions answered. The benefits, risks, alternatives
and complications were also discussed. The patient understands and
wishes to proceed with the procedure. Written consent was obtained.

Ultrasound was performed to localize and mark an adequate pocket of
fluid in the left chest. The area was then prepped and draped in the
normal sterile fashion. 1% Lidocaine was used for local anesthesia.
Under ultrasound guidance a 6 Fr Safe-T-Centesis catheter was
introduced. Thoracentesis was performed. The catheter was removed
and a dressing applied.
FINDINGS: A total of approximately 600 cc of yellow fluid was removed.
IMPRESSION: Successful ultrasound guided therapeutic left thoracentesis yielding
600 cc of pleural fluid. Follow-up chest x-ray revealed no
pneumothorax.

## 2019-03-05 DIAGNOSIS — Z79899 Other long term (current) drug therapy: Secondary | ICD-10-CM | POA: Diagnosis not present

## 2019-03-05 DIAGNOSIS — I4819 Other persistent atrial fibrillation: Secondary | ICD-10-CM | POA: Diagnosis not present

## 2019-03-05 DIAGNOSIS — E1121 Type 2 diabetes mellitus with diabetic nephropathy: Secondary | ICD-10-CM | POA: Diagnosis not present

## 2019-03-12 DIAGNOSIS — E1121 Type 2 diabetes mellitus with diabetic nephropathy: Secondary | ICD-10-CM | POA: Diagnosis not present

## 2019-03-12 DIAGNOSIS — I700 Atherosclerosis of aorta: Secondary | ICD-10-CM | POA: Diagnosis not present

## 2019-03-12 DIAGNOSIS — E1122 Type 2 diabetes mellitus with diabetic chronic kidney disease: Secondary | ICD-10-CM | POA: Diagnosis not present

## 2019-03-12 DIAGNOSIS — N183 Chronic kidney disease, stage 3 (moderate): Secondary | ICD-10-CM | POA: Diagnosis not present

## 2019-03-12 DIAGNOSIS — E042 Nontoxic multinodular goiter: Secondary | ICD-10-CM | POA: Diagnosis not present

## 2019-03-12 DIAGNOSIS — I130 Hypertensive heart and chronic kidney disease with heart failure and stage 1 through stage 4 chronic kidney disease, or unspecified chronic kidney disease: Secondary | ICD-10-CM | POA: Diagnosis not present

## 2019-03-12 DIAGNOSIS — I4819 Other persistent atrial fibrillation: Secondary | ICD-10-CM | POA: Diagnosis not present

## 2019-03-12 DIAGNOSIS — M47816 Spondylosis without myelopathy or radiculopathy, lumbar region: Secondary | ICD-10-CM | POA: Diagnosis not present

## 2019-03-12 DIAGNOSIS — I5042 Chronic combined systolic (congestive) and diastolic (congestive) heart failure: Secondary | ICD-10-CM | POA: Diagnosis not present

## 2019-04-25 DIAGNOSIS — I480 Paroxysmal atrial fibrillation: Secondary | ICD-10-CM | POA: Diagnosis not present

## 2019-04-25 DIAGNOSIS — I10 Essential (primary) hypertension: Secondary | ICD-10-CM | POA: Diagnosis not present

## 2019-04-25 DIAGNOSIS — I313 Pericardial effusion (noninflammatory): Secondary | ICD-10-CM | POA: Diagnosis not present

## 2019-04-25 DIAGNOSIS — I509 Heart failure, unspecified: Secondary | ICD-10-CM | POA: Diagnosis not present

## 2019-04-25 DIAGNOSIS — I4819 Other persistent atrial fibrillation: Secondary | ICD-10-CM | POA: Diagnosis not present

## 2019-04-25 DIAGNOSIS — I700 Atherosclerosis of aorta: Secondary | ICD-10-CM | POA: Diagnosis not present

## 2019-04-25 DIAGNOSIS — I5042 Chronic combined systolic (congestive) and diastolic (congestive) heart failure: Secondary | ICD-10-CM | POA: Diagnosis not present

## 2019-07-23 DIAGNOSIS — I10 Essential (primary) hypertension: Secondary | ICD-10-CM | POA: Diagnosis not present

## 2019-07-23 DIAGNOSIS — I5042 Chronic combined systolic (congestive) and diastolic (congestive) heart failure: Secondary | ICD-10-CM | POA: Diagnosis not present

## 2019-07-23 DIAGNOSIS — I700 Atherosclerosis of aorta: Secondary | ICD-10-CM | POA: Diagnosis not present

## 2019-07-23 DIAGNOSIS — I313 Pericardial effusion (noninflammatory): Secondary | ICD-10-CM | POA: Diagnosis not present

## 2019-07-23 DIAGNOSIS — I4819 Other persistent atrial fibrillation: Secondary | ICD-10-CM | POA: Diagnosis not present

## 2019-09-03 DIAGNOSIS — I4819 Other persistent atrial fibrillation: Secondary | ICD-10-CM | POA: Diagnosis not present

## 2019-09-03 DIAGNOSIS — E1121 Type 2 diabetes mellitus with diabetic nephropathy: Secondary | ICD-10-CM | POA: Diagnosis not present

## 2019-09-03 DIAGNOSIS — N183 Chronic kidney disease, stage 3 unspecified: Secondary | ICD-10-CM | POA: Diagnosis not present

## 2019-09-03 DIAGNOSIS — D582 Other hemoglobinopathies: Secondary | ICD-10-CM | POA: Diagnosis not present

## 2019-09-03 DIAGNOSIS — E785 Hyperlipidemia, unspecified: Secondary | ICD-10-CM | POA: Diagnosis not present

## 2019-09-03 DIAGNOSIS — I10 Essential (primary) hypertension: Secondary | ICD-10-CM | POA: Diagnosis not present

## 2019-09-10 DIAGNOSIS — M47816 Spondylosis without myelopathy or radiculopathy, lumbar region: Secondary | ICD-10-CM | POA: Diagnosis not present

## 2019-09-10 DIAGNOSIS — I5042 Chronic combined systolic (congestive) and diastolic (congestive) heart failure: Secondary | ICD-10-CM | POA: Diagnosis not present

## 2019-09-10 DIAGNOSIS — I4819 Other persistent atrial fibrillation: Secondary | ICD-10-CM | POA: Diagnosis not present

## 2019-09-10 DIAGNOSIS — I130 Hypertensive heart and chronic kidney disease with heart failure and stage 1 through stage 4 chronic kidney disease, or unspecified chronic kidney disease: Secondary | ICD-10-CM | POA: Diagnosis not present

## 2019-09-10 DIAGNOSIS — N183 Chronic kidney disease, stage 3 unspecified: Secondary | ICD-10-CM | POA: Diagnosis not present

## 2019-09-10 DIAGNOSIS — I700 Atherosclerosis of aorta: Secondary | ICD-10-CM | POA: Diagnosis not present

## 2019-09-10 DIAGNOSIS — E1122 Type 2 diabetes mellitus with diabetic chronic kidney disease: Secondary | ICD-10-CM | POA: Diagnosis not present

## 2019-09-10 DIAGNOSIS — Z0000 Encounter for general adult medical examination without abnormal findings: Secondary | ICD-10-CM | POA: Diagnosis not present

## 2019-09-10 DIAGNOSIS — D582 Other hemoglobinopathies: Secondary | ICD-10-CM | POA: Diagnosis not present

## 2019-09-26 DIAGNOSIS — H40003 Preglaucoma, unspecified, bilateral: Secondary | ICD-10-CM | POA: Diagnosis not present

## 2019-10-02 DIAGNOSIS — H401112 Primary open-angle glaucoma, right eye, moderate stage: Secondary | ICD-10-CM | POA: Diagnosis not present

## 2019-10-10 DIAGNOSIS — H401112 Primary open-angle glaucoma, right eye, moderate stage: Secondary | ICD-10-CM | POA: Diagnosis not present

## 2019-10-17 DIAGNOSIS — H26492 Other secondary cataract, left eye: Secondary | ICD-10-CM | POA: Diagnosis not present

## 2019-10-22 DIAGNOSIS — E1121 Type 2 diabetes mellitus with diabetic nephropathy: Secondary | ICD-10-CM | POA: Diagnosis not present

## 2019-10-22 DIAGNOSIS — I4819 Other persistent atrial fibrillation: Secondary | ICD-10-CM | POA: Diagnosis not present

## 2019-10-22 DIAGNOSIS — N183 Chronic kidney disease, stage 3 unspecified: Secondary | ICD-10-CM | POA: Diagnosis not present

## 2019-10-22 DIAGNOSIS — R768 Other specified abnormal immunological findings in serum: Secondary | ICD-10-CM | POA: Diagnosis not present

## 2019-10-22 DIAGNOSIS — I313 Pericardial effusion (noninflammatory): Secondary | ICD-10-CM | POA: Diagnosis not present

## 2019-10-22 DIAGNOSIS — I5042 Chronic combined systolic (congestive) and diastolic (congestive) heart failure: Secondary | ICD-10-CM | POA: Diagnosis not present

## 2019-10-22 DIAGNOSIS — I10 Essential (primary) hypertension: Secondary | ICD-10-CM | POA: Diagnosis not present

## 2019-10-22 DIAGNOSIS — I700 Atherosclerosis of aorta: Secondary | ICD-10-CM | POA: Diagnosis not present

## 2019-11-14 DIAGNOSIS — R208 Other disturbances of skin sensation: Secondary | ICD-10-CM | POA: Diagnosis not present

## 2019-11-14 DIAGNOSIS — Z85828 Personal history of other malignant neoplasm of skin: Secondary | ICD-10-CM | POA: Diagnosis not present

## 2019-11-14 DIAGNOSIS — Z08 Encounter for follow-up examination after completed treatment for malignant neoplasm: Secondary | ICD-10-CM | POA: Diagnosis not present

## 2019-11-14 DIAGNOSIS — X32XXXA Exposure to sunlight, initial encounter: Secondary | ICD-10-CM | POA: Diagnosis not present

## 2019-11-14 DIAGNOSIS — D225 Melanocytic nevi of trunk: Secondary | ICD-10-CM | POA: Diagnosis not present

## 2019-11-14 DIAGNOSIS — C44622 Squamous cell carcinoma of skin of right upper limb, including shoulder: Secondary | ICD-10-CM | POA: Diagnosis not present

## 2019-11-14 DIAGNOSIS — L821 Other seborrheic keratosis: Secondary | ICD-10-CM | POA: Diagnosis not present

## 2019-11-14 DIAGNOSIS — D485 Neoplasm of uncertain behavior of skin: Secondary | ICD-10-CM | POA: Diagnosis not present

## 2019-11-14 DIAGNOSIS — L570 Actinic keratosis: Secondary | ICD-10-CM | POA: Diagnosis not present

## 2019-12-01 IMAGING — CT CT CHEST W/O CM
2 of 4 series · 15 of 36 positions shown, 18 images · non-contrast
Comparison: Plain film dated 08/31/2017, CT from 09/26/2012

CLINICAL DATA: Cough for several months

EXAM:
CT CHEST WITHOUT CONTRAST
TECHNIQUE: Multidetector CT imaging of the chest was performed following the
standard protocol without IV contrast.

[Series 2: chest · axial · 0.64mm/px · z∈[-1178,-898]mm · 12 of 166 slices shown, 15 images (1 of 2)]
[im 13/166  mediastinal]
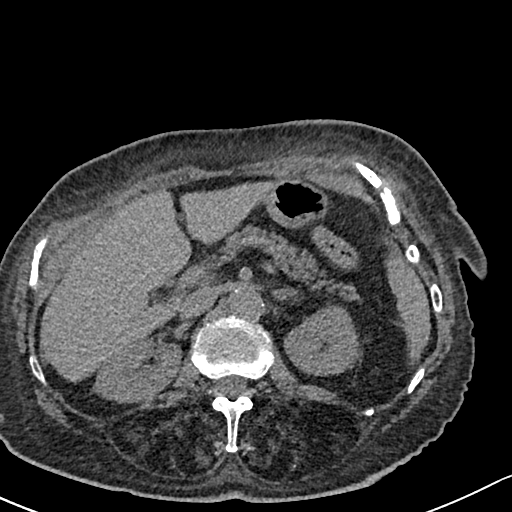
[im 13/166  lung]
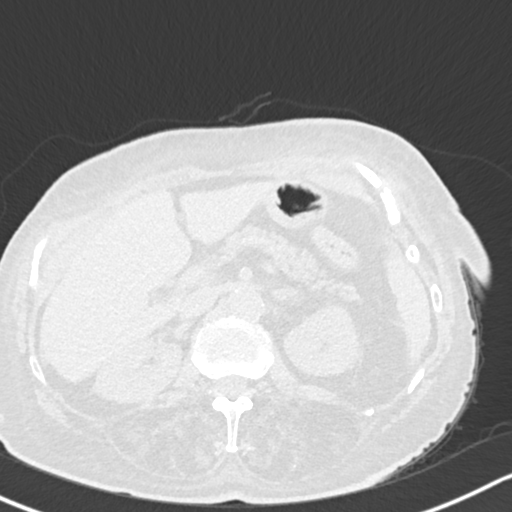
[im 26/166  lung]
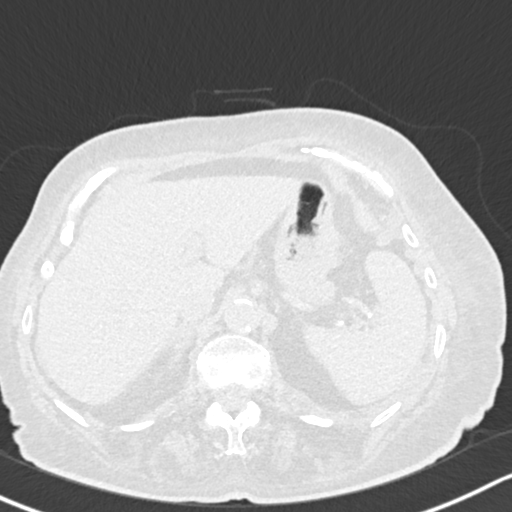
[im 39/166  lung]
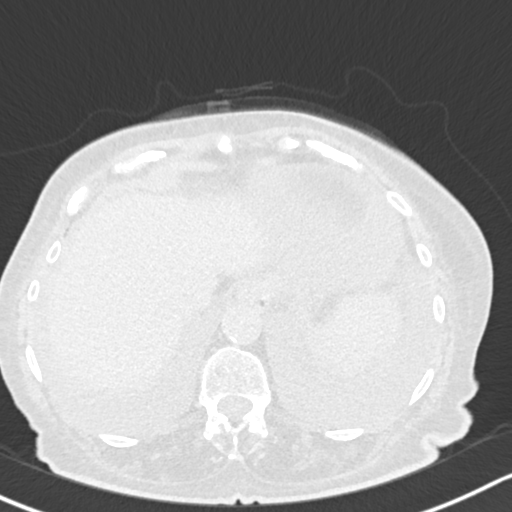
[im 51/166  lung]
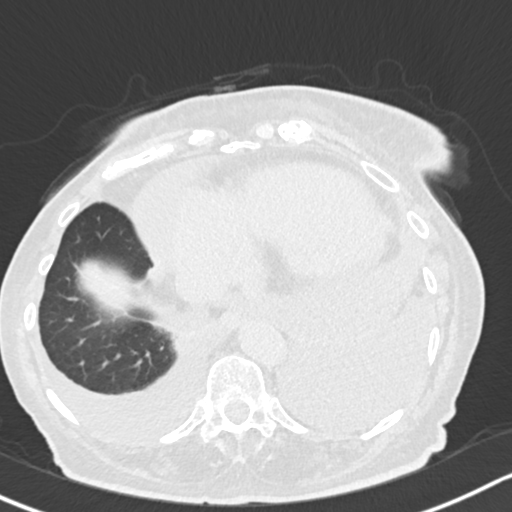
[im 64/166  mediastinal]
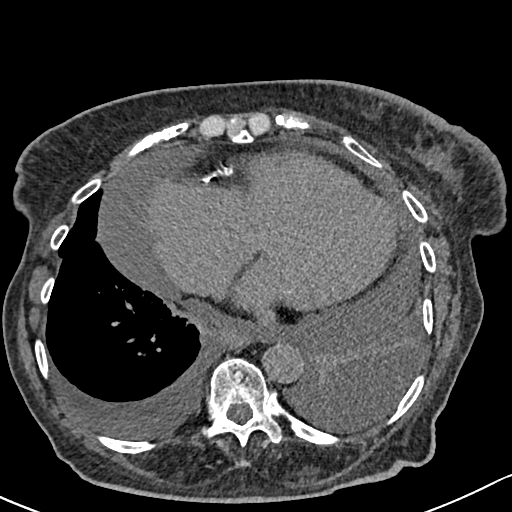
[im 64/166  lung]
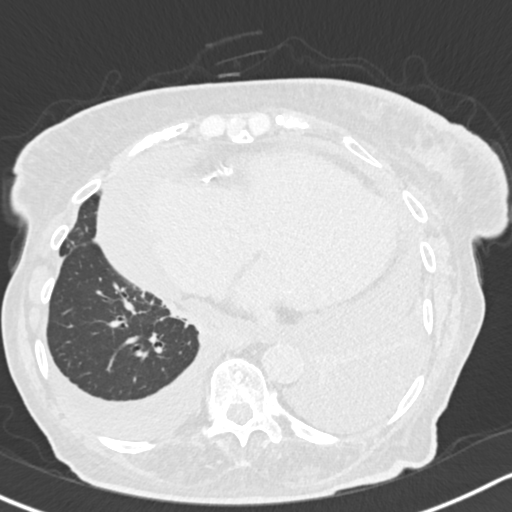
[im 77/166  lung]
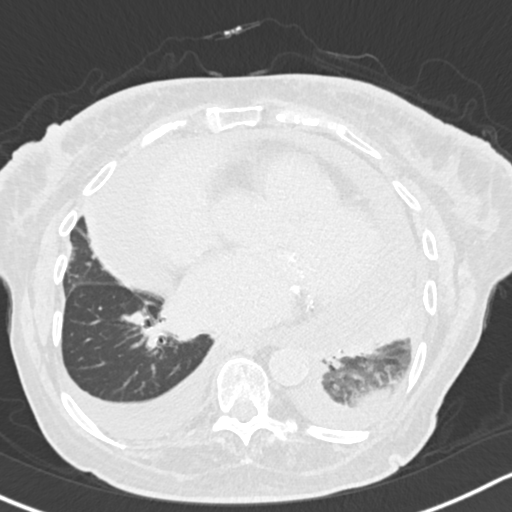
[im 89/166  lung]
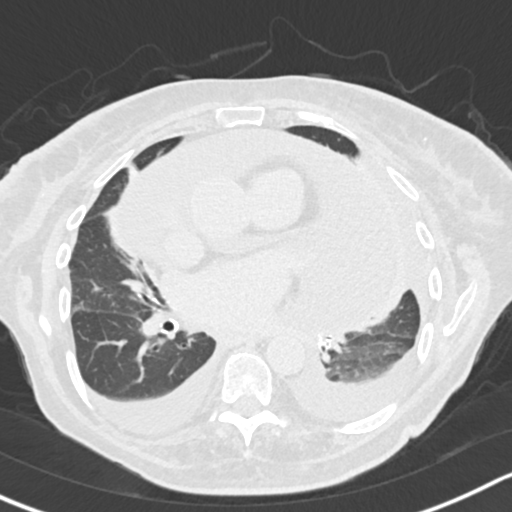
[im 102/166  lung]
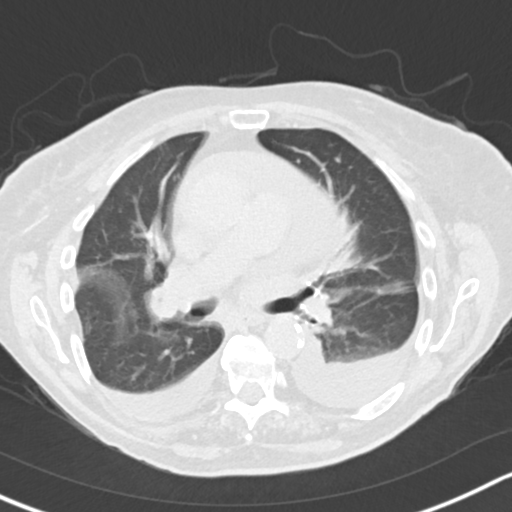
[im 115/166  mediastinal]
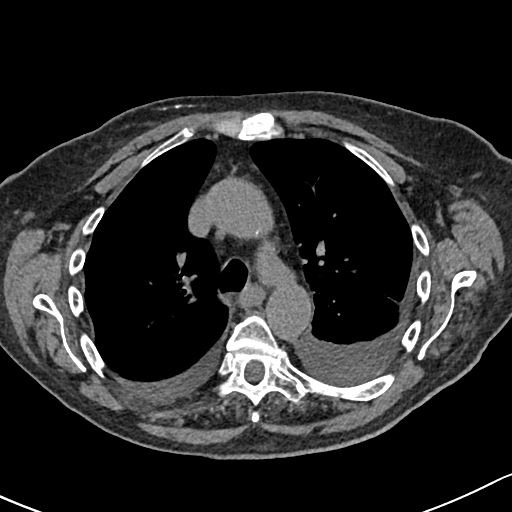
[im 115/166  lung]
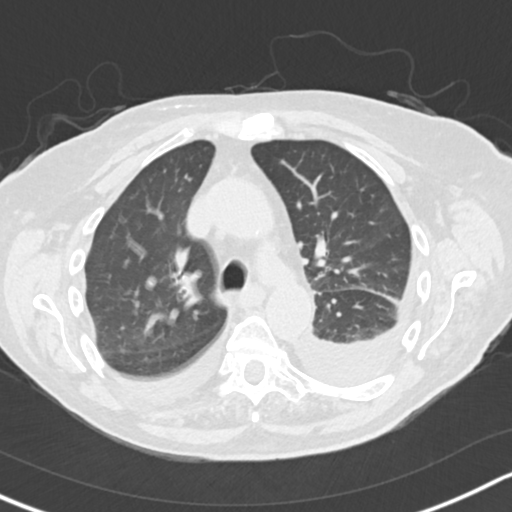
[im 127/166  lung]
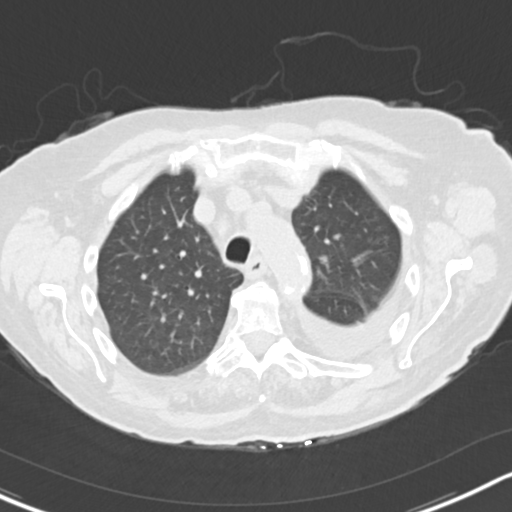
[im 140/166  lung]
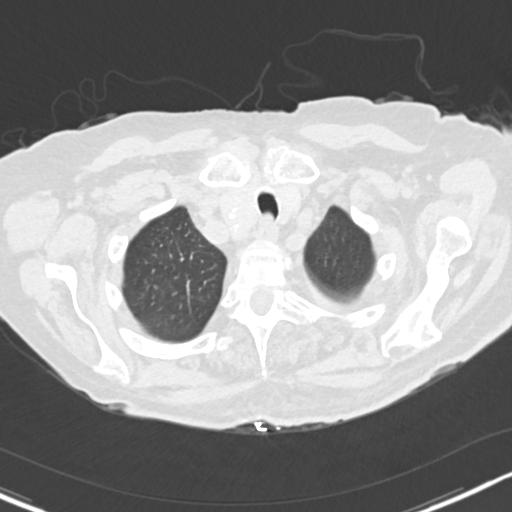
[im 153/166  lung]
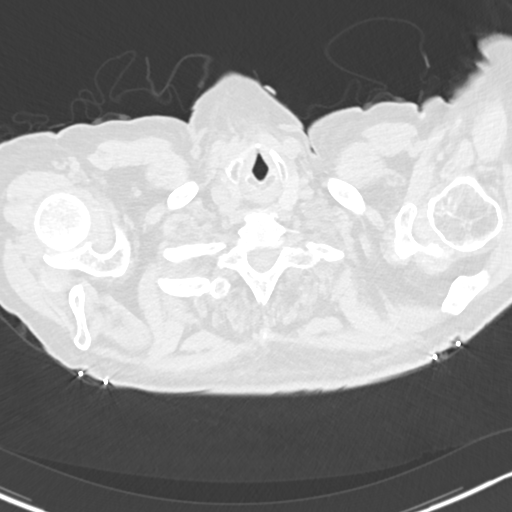

[Series 5: chest · coronal · 0.64mm/px · 3 of 141 slices shown (2 of 2)]
[im 29/141  lung]
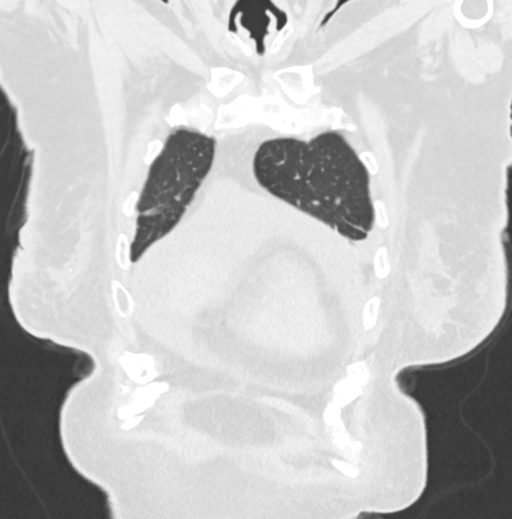
[im 57/141  lung]
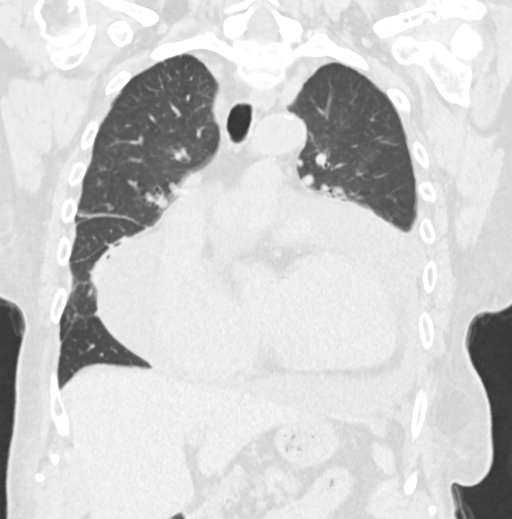
[im 85/141  lung]
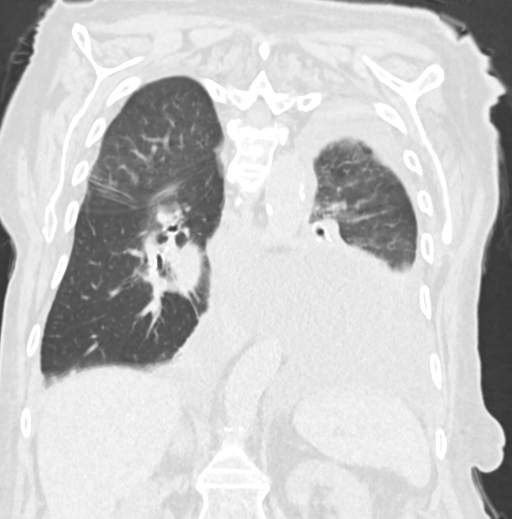

[15 of 36 positions shown; findings below may reference images not displayed]

FINDINGS: Cardiovascular: Somewhat limited without contrast administration.
Aortic atherosclerotic changes are noted without aneurysmal
dilatation. Heavy coronary calcifications are seen. The heart is
enlarged in size. A large pericardial effusion is seen increased
when compared with the prior CT examination. It measures
approximately 3.3 cm in thickness laterally on the right and
approximately 3.9 cm in thickness on the left near the origin of the
great vessels.

Mediastinum/Nodes: Thoracic in the lead demonstrates some
calcifications within the thyroid bilaterally. No definitive nodule
is seen. These changes are stable from the prior CT.

Lungs/Pleura: A moderate right-sided pleural effusion is noted. Mild
right upper and lower lobe atelectatic changes are seen. No focal
infiltrate or parenchymal nodule is seen. Moderate left-sided
pleural effusion is noted as well. Some lower lobe atelectatic
changes are seen. No parenchymal nodule is seen. There are changes
suggestive of loculation.

Upper Abdomen: Gallbladder has been surgically removed. Adrenal
glands are stable.

Musculoskeletal: Stable 1 cm left breast nodule is noted. Given its
stability over several years it is likely benign in etiology.
Degenerative changes of the thoracic spine are seen. No acute bony
abnormality is noted.
IMPRESSION: Bilateral pleural effusions of a moderate degree. Some suggestion of
loculation is noted on the left. Associated atelectatic changes are
noted.

Large pericardial effusion increased from the prior CT examination.

Stable left breast nodule likely of a benign etiology given its
long-term stability.

Aortic Atherosclerosis ([id number]).

These results will be called to the ordering clinician or
representative by the Radiologist Assistant, and communication
documented in the PACS or zVision Dashboard.

## 2019-12-04 ENCOUNTER — Ambulatory Visit
Admission: RE | Admit: 2019-12-04 | Discharge: 2019-12-04 | Disposition: A | Discharge status: Home / Self Care | Payer: Medicare HMO | Source: Ambulatory Visit | Attending: Internal Medicine | Admitting: Internal Medicine

## 2019-12-04 ENCOUNTER — Other Ambulatory Visit: Payer: Self-pay | Admitting: Internal Medicine

## 2019-12-04 ENCOUNTER — Other Ambulatory Visit (HOSPITAL_COMMUNITY): Payer: Self-pay | Admitting: Internal Medicine

## 2019-12-04 ENCOUNTER — Other Ambulatory Visit: Payer: Self-pay

## 2019-12-04 DIAGNOSIS — R410 Disorientation, unspecified: Secondary | ICD-10-CM | POA: Insufficient documentation

## 2019-12-04 DIAGNOSIS — Z20822 Contact with and (suspected) exposure to covid-19: Secondary | ICD-10-CM | POA: Diagnosis not present

## 2019-12-04 DIAGNOSIS — E042 Nontoxic multinodular goiter: Secondary | ICD-10-CM | POA: Diagnosis not present

## 2019-12-04 DIAGNOSIS — I10 Essential (primary) hypertension: Secondary | ICD-10-CM | POA: Diagnosis not present

## 2019-12-04 DIAGNOSIS — R768 Other specified abnormal immunological findings in serum: Secondary | ICD-10-CM | POA: Diagnosis not present

## 2019-12-04 DIAGNOSIS — R05 Cough: Secondary | ICD-10-CM | POA: Diagnosis not present

## 2019-12-04 DIAGNOSIS — I4819 Other persistent atrial fibrillation: Secondary | ICD-10-CM | POA: Diagnosis not present

## 2019-12-04 DIAGNOSIS — I700 Atherosclerosis of aorta: Secondary | ICD-10-CM | POA: Diagnosis not present

## 2019-12-04 DIAGNOSIS — N183 Chronic kidney disease, stage 3 unspecified: Secondary | ICD-10-CM | POA: Diagnosis not present

## 2019-12-04 DIAGNOSIS — I5042 Chronic combined systolic (congestive) and diastolic (congestive) heart failure: Secondary | ICD-10-CM | POA: Diagnosis not present

## 2019-12-05 DIAGNOSIS — R05 Cough: Secondary | ICD-10-CM | POA: Diagnosis not present

## 2019-12-05 DIAGNOSIS — R410 Disorientation, unspecified: Secondary | ICD-10-CM | POA: Diagnosis not present

## 2019-12-05 DIAGNOSIS — Z20822 Contact with and (suspected) exposure to covid-19: Secondary | ICD-10-CM | POA: Diagnosis not present

## 2019-12-05 DIAGNOSIS — I5042 Chronic combined systolic (congestive) and diastolic (congestive) heart failure: Secondary | ICD-10-CM | POA: Diagnosis not present

## 2019-12-05 DIAGNOSIS — N183 Chronic kidney disease, stage 3 unspecified: Secondary | ICD-10-CM | POA: Diagnosis not present

## 2019-12-05 DIAGNOSIS — E042 Nontoxic multinodular goiter: Secondary | ICD-10-CM | POA: Diagnosis not present

## 2019-12-05 DIAGNOSIS — I700 Atherosclerosis of aorta: Secondary | ICD-10-CM | POA: Diagnosis not present

## 2019-12-05 DIAGNOSIS — I10 Essential (primary) hypertension: Secondary | ICD-10-CM | POA: Diagnosis not present

## 2019-12-05 DIAGNOSIS — I4819 Other persistent atrial fibrillation: Secondary | ICD-10-CM | POA: Diagnosis not present

## 2019-12-09 ENCOUNTER — Other Ambulatory Visit: Payer: Self-pay | Admitting: Internal Medicine

## 2019-12-09 DIAGNOSIS — R410 Disorientation, unspecified: Secondary | ICD-10-CM

## 2019-12-09 DIAGNOSIS — I4819 Other persistent atrial fibrillation: Secondary | ICD-10-CM

## 2019-12-10 DIAGNOSIS — Z20822 Contact with and (suspected) exposure to covid-19: Secondary | ICD-10-CM | POA: Diagnosis not present

## 2019-12-10 DIAGNOSIS — I700 Atherosclerosis of aorta: Secondary | ICD-10-CM | POA: Diagnosis not present

## 2019-12-10 DIAGNOSIS — I10 Essential (primary) hypertension: Secondary | ICD-10-CM | POA: Diagnosis not present

## 2019-12-10 DIAGNOSIS — I4819 Other persistent atrial fibrillation: Secondary | ICD-10-CM | POA: Diagnosis not present

## 2019-12-10 DIAGNOSIS — R05 Cough: Secondary | ICD-10-CM | POA: Diagnosis not present

## 2019-12-10 DIAGNOSIS — N183 Chronic kidney disease, stage 3 unspecified: Secondary | ICD-10-CM | POA: Diagnosis not present

## 2019-12-10 DIAGNOSIS — I5042 Chronic combined systolic (congestive) and diastolic (congestive) heart failure: Secondary | ICD-10-CM | POA: Diagnosis not present

## 2019-12-10 DIAGNOSIS — E042 Nontoxic multinodular goiter: Secondary | ICD-10-CM | POA: Diagnosis not present

## 2019-12-10 DIAGNOSIS — D72819 Decreased white blood cell count, unspecified: Secondary | ICD-10-CM | POA: Diagnosis not present

## 2019-12-10 DIAGNOSIS — E1121 Type 2 diabetes mellitus with diabetic nephropathy: Secondary | ICD-10-CM | POA: Diagnosis not present

## 2019-12-10 DIAGNOSIS — R410 Disorientation, unspecified: Secondary | ICD-10-CM | POA: Diagnosis not present

## 2019-12-15 ENCOUNTER — Inpatient Hospital Stay
Admission: AD | Admit: 2019-12-15 | Discharge: 2019-12-25 | DRG: 947 | Disposition: A | Discharge status: Hospice | Payer: Medicare HMO | Attending: Internal Medicine | Admitting: Internal Medicine

## 2019-12-15 ENCOUNTER — Emergency Department: Payer: Medicare HMO

## 2019-12-15 ENCOUNTER — Other Ambulatory Visit: Payer: Self-pay

## 2019-12-15 DIAGNOSIS — R4182 Altered mental status, unspecified: Principal | ICD-10-CM | POA: Diagnosis present

## 2019-12-15 DIAGNOSIS — D72819 Decreased white blood cell count, unspecified: Secondary | ICD-10-CM | POA: Diagnosis present

## 2019-12-15 DIAGNOSIS — Z20822 Contact with and (suspected) exposure to covid-19: Secondary | ICD-10-CM | POA: Diagnosis not present

## 2019-12-15 DIAGNOSIS — E041 Nontoxic single thyroid nodule: Secondary | ICD-10-CM | POA: Diagnosis present

## 2019-12-15 DIAGNOSIS — Z9889 Other specified postprocedural states: Secondary | ICD-10-CM

## 2019-12-15 DIAGNOSIS — Z9049 Acquired absence of other specified parts of digestive tract: Secondary | ICD-10-CM

## 2019-12-15 DIAGNOSIS — R188 Other ascites: Secondary | ICD-10-CM | POA: Diagnosis present

## 2019-12-15 DIAGNOSIS — I959 Hypotension, unspecified: Secondary | ICD-10-CM | POA: Diagnosis not present

## 2019-12-15 DIAGNOSIS — W06XXXA Fall from bed, initial encounter: Secondary | ICD-10-CM | POA: Diagnosis present

## 2019-12-15 DIAGNOSIS — I5022 Chronic systolic (congestive) heart failure: Secondary | ICD-10-CM | POA: Diagnosis present

## 2019-12-15 DIAGNOSIS — N179 Acute kidney failure, unspecified: Secondary | ICD-10-CM | POA: Diagnosis present

## 2019-12-15 DIAGNOSIS — M2550 Pain in unspecified joint: Secondary | ICD-10-CM | POA: Diagnosis not present

## 2019-12-15 DIAGNOSIS — E43 Unspecified severe protein-calorie malnutrition: Secondary | ICD-10-CM | POA: Diagnosis not present

## 2019-12-15 DIAGNOSIS — H409 Unspecified glaucoma: Secondary | ICD-10-CM | POA: Diagnosis present

## 2019-12-15 DIAGNOSIS — E785 Hyperlipidemia, unspecified: Secondary | ICD-10-CM | POA: Diagnosis present

## 2019-12-15 DIAGNOSIS — R0602 Shortness of breath: Secondary | ICD-10-CM | POA: Diagnosis not present

## 2019-12-15 DIAGNOSIS — Z66 Do not resuscitate: Secondary | ICD-10-CM | POA: Diagnosis present

## 2019-12-15 DIAGNOSIS — C7800 Secondary malignant neoplasm of unspecified lung: Secondary | ICD-10-CM | POA: Diagnosis not present

## 2019-12-15 DIAGNOSIS — R634 Abnormal weight loss: Secondary | ICD-10-CM | POA: Diagnosis not present

## 2019-12-15 DIAGNOSIS — Z90710 Acquired absence of both cervix and uterus: Secondary | ICD-10-CM

## 2019-12-15 DIAGNOSIS — R160 Hepatomegaly, not elsewhere classified: Secondary | ICD-10-CM | POA: Diagnosis present

## 2019-12-15 DIAGNOSIS — R570 Cardiogenic shock: Secondary | ICD-10-CM | POA: Diagnosis not present

## 2019-12-15 DIAGNOSIS — Z85828 Personal history of other malignant neoplasm of skin: Secondary | ICD-10-CM

## 2019-12-15 DIAGNOSIS — J9601 Acute respiratory failure with hypoxia: Secondary | ICD-10-CM | POA: Diagnosis present

## 2019-12-15 DIAGNOSIS — R0902 Hypoxemia: Secondary | ICD-10-CM | POA: Diagnosis not present

## 2019-12-15 DIAGNOSIS — I4891 Unspecified atrial fibrillation: Secondary | ICD-10-CM | POA: Diagnosis not present

## 2019-12-15 DIAGNOSIS — D6959 Other secondary thrombocytopenia: Secondary | ICD-10-CM | POA: Diagnosis not present

## 2019-12-15 DIAGNOSIS — R627 Adult failure to thrive: Secondary | ICD-10-CM | POA: Diagnosis present

## 2019-12-15 DIAGNOSIS — R932 Abnormal findings on diagnostic imaging of liver and biliary tract: Secondary | ICD-10-CM | POA: Diagnosis not present

## 2019-12-15 DIAGNOSIS — I110 Hypertensive heart disease with heart failure: Secondary | ICD-10-CM | POA: Diagnosis present

## 2019-12-15 DIAGNOSIS — G9340 Encephalopathy, unspecified: Secondary | ICD-10-CM | POA: Diagnosis not present

## 2019-12-15 DIAGNOSIS — Z9114 Patient's other noncompliance with medication regimen: Secondary | ICD-10-CM

## 2019-12-15 DIAGNOSIS — Z515 Encounter for palliative care: Secondary | ICD-10-CM | POA: Diagnosis present

## 2019-12-15 DIAGNOSIS — I313 Pericardial effusion (noninflammatory): Secondary | ICD-10-CM | POA: Diagnosis present

## 2019-12-15 DIAGNOSIS — R600 Localized edema: Secondary | ICD-10-CM | POA: Diagnosis not present

## 2019-12-15 DIAGNOSIS — R404 Transient alteration of awareness: Secondary | ICD-10-CM | POA: Diagnosis not present

## 2019-12-15 DIAGNOSIS — J189 Pneumonia, unspecified organism: Secondary | ICD-10-CM | POA: Diagnosis not present

## 2019-12-15 DIAGNOSIS — R64 Cachexia: Secondary | ICD-10-CM | POA: Diagnosis present

## 2019-12-15 DIAGNOSIS — D72829 Elevated white blood cell count, unspecified: Secondary | ICD-10-CM | POA: Diagnosis not present

## 2019-12-15 DIAGNOSIS — R05 Cough: Secondary | ICD-10-CM | POA: Diagnosis present

## 2019-12-15 DIAGNOSIS — J90 Pleural effusion, not elsewhere classified: Secondary | ICD-10-CM | POA: Diagnosis present

## 2019-12-15 DIAGNOSIS — R6251 Failure to thrive (child): Secondary | ICD-10-CM

## 2019-12-15 DIAGNOSIS — C787 Secondary malignant neoplasm of liver and intrahepatic bile duct: Secondary | ICD-10-CM | POA: Diagnosis not present

## 2019-12-15 DIAGNOSIS — I700 Atherosclerosis of aorta: Secondary | ICD-10-CM | POA: Diagnosis present

## 2019-12-15 DIAGNOSIS — Z6820 Body mass index (BMI) 20.0-20.9, adult: Secondary | ICD-10-CM

## 2019-12-15 DIAGNOSIS — Z8051 Family history of malignant neoplasm of kidney: Secondary | ICD-10-CM

## 2019-12-15 DIAGNOSIS — R768 Other specified abnormal immunological findings in serum: Secondary | ICD-10-CM | POA: Diagnosis not present

## 2019-12-15 DIAGNOSIS — R531 Weakness: Secondary | ICD-10-CM | POA: Diagnosis not present

## 2019-12-15 DIAGNOSIS — R54 Age-related physical debility: Secondary | ICD-10-CM | POA: Diagnosis present

## 2019-12-15 DIAGNOSIS — R000 Tachycardia, unspecified: Secondary | ICD-10-CM | POA: Diagnosis not present

## 2019-12-15 DIAGNOSIS — I480 Paroxysmal atrial fibrillation: Secondary | ICD-10-CM | POA: Diagnosis not present

## 2019-12-15 DIAGNOSIS — Z7901 Long term (current) use of anticoagulants: Secondary | ICD-10-CM

## 2019-12-15 DIAGNOSIS — R638 Other symptoms and signs concerning food and fluid intake: Secondary | ICD-10-CM

## 2019-12-15 DIAGNOSIS — R918 Other nonspecific abnormal finding of lung field: Secondary | ICD-10-CM | POA: Diagnosis not present

## 2019-12-15 DIAGNOSIS — K5730 Diverticulosis of large intestine without perforation or abscess without bleeding: Secondary | ICD-10-CM | POA: Diagnosis not present

## 2019-12-15 DIAGNOSIS — E871 Hypo-osmolality and hyponatremia: Secondary | ICD-10-CM | POA: Diagnosis present

## 2019-12-15 DIAGNOSIS — I420 Dilated cardiomyopathy: Secondary | ICD-10-CM | POA: Diagnosis present

## 2019-12-15 DIAGNOSIS — J939 Pneumothorax, unspecified: Secondary | ICD-10-CM | POA: Diagnosis not present

## 2019-12-15 DIAGNOSIS — R519 Headache, unspecified: Secondary | ICD-10-CM | POA: Diagnosis not present

## 2019-12-15 DIAGNOSIS — I482 Chronic atrial fibrillation, unspecified: Secondary | ICD-10-CM | POA: Diagnosis present

## 2019-12-15 DIAGNOSIS — Z79899 Other long term (current) drug therapy: Secondary | ICD-10-CM

## 2019-12-15 DIAGNOSIS — J9600 Acute respiratory failure, unspecified whether with hypoxia or hypercapnia: Secondary | ICD-10-CM | POA: Diagnosis not present

## 2019-12-15 DIAGNOSIS — R609 Edema, unspecified: Secondary | ICD-10-CM

## 2019-12-15 DIAGNOSIS — Z7401 Bed confinement status: Secondary | ICD-10-CM | POA: Diagnosis not present

## 2019-12-15 DIAGNOSIS — Z8249 Family history of ischemic heart disease and other diseases of the circulatory system: Secondary | ICD-10-CM

## 2019-12-15 DIAGNOSIS — R001 Bradycardia, unspecified: Secondary | ICD-10-CM | POA: Diagnosis not present

## 2019-12-15 DIAGNOSIS — R58 Hemorrhage, not elsewhere classified: Secondary | ICD-10-CM | POA: Diagnosis not present

## 2019-12-15 DIAGNOSIS — Z801 Family history of malignant neoplasm of trachea, bronchus and lung: Secondary | ICD-10-CM

## 2019-12-15 DIAGNOSIS — Z7189 Other specified counseling: Secondary | ICD-10-CM | POA: Diagnosis not present

## 2019-12-15 DIAGNOSIS — R410 Disorientation, unspecified: Secondary | ICD-10-CM | POA: Diagnosis not present

## 2019-12-15 DIAGNOSIS — K7689 Other specified diseases of liver: Secondary | ICD-10-CM | POA: Diagnosis not present

## 2019-12-15 LAB — TROPONIN I (HIGH SENSITIVITY)
Troponin I (High Sensitivity): 68 ng/L — ABNORMAL HIGH (ref <18)
Troponin I (High Sensitivity): 66 ng/L — ABNORMAL HIGH (ref <18)
Troponin I (High Sensitivity): 56 ng/L — ABNORMAL HIGH (ref <18)

## 2019-12-15 LAB — URINALYSIS, COMPLETE (UACMP) WITH MICROSCOPIC
Bilirubin Urine: NEGATIVE
Glucose, UA: NEGATIVE mg/dL
Ketones, ur: NEGATIVE mg/dL
Leukocytes,Ua: NEGATIVE
Nitrite: NEGATIVE
Protein, ur: >=300 mg/dL — AB
Specific Gravity, Urine: 1.026 (ref 1.005–1.030)
pH: 5 (ref 5.0–8.0)

## 2019-12-15 LAB — BRAIN NATRIURETIC PEPTIDE: B Natriuretic Peptide: 781.6 pg/mL — ABNORMAL HIGH (ref 0.0–100.0)

## 2019-12-15 LAB — CBC
HCT: 46.8 % — ABNORMAL HIGH (ref 36.0–46.0)
Hemoglobin: 16.6 g/dL — ABNORMAL HIGH (ref 12.0–15.0)
MCH: 30.2 pg (ref 26.0–34.0)
MCHC: 35.5 g/dL (ref 30.0–36.0)
MCV: 85.1 fL (ref 80.0–100.0)
Platelets: 94 10*3/uL — ABNORMAL LOW (ref 150–400)
RBC: 5.5 MIL/uL — ABNORMAL HIGH (ref 3.87–5.11)
RDW: 13.7 % (ref 11.5–15.5)
WBC: 3.7 10*3/uL — ABNORMAL LOW (ref 4.0–10.5)
nRBC: 0 % (ref 0.0–0.2)

## 2019-12-15 LAB — PROCALCITONIN: Procalcitonin: <0.1 ng/mL

## 2019-12-15 LAB — COMPREHENSIVE METABOLIC PANEL
ALT: 27 U/L (ref 0–44)
AST: 70 U/L — ABNORMAL HIGH (ref 15–41)
Albumin: 3.5 g/dL (ref 3.5–5.0)
Alkaline Phosphatase: 72 U/L (ref 38–126)
Anion gap: 9 (ref 5–15)
BUN: 18 mg/dL (ref 8–23)
CO2: 26 mmol/L (ref 22–32)
Calcium: 8.5 mg/dL — ABNORMAL LOW (ref 8.9–10.3)
Chloride: 92 mmol/L — ABNORMAL LOW (ref 98–111)
Creatinine, Ser: 0.93 mg/dL (ref 0.44–1.00)
GFR calc Af Amer: >60 mL/min (ref >60)
GFR calc non Af Amer: 56 mL/min — ABNORMAL LOW (ref >60)
Glucose, Bld: 149 mg/dL — ABNORMAL HIGH (ref 70–99)
Potassium: 4.7 mmol/L (ref 3.5–5.1)
Sodium: 127 mmol/L — ABNORMAL LOW (ref 135–145)
Total Bilirubin: 1.6 mg/dL — ABNORMAL HIGH (ref 0.3–1.2)
Total Protein: 5.7 g/dL — ABNORMAL LOW (ref 6.5–8.1)

## 2019-12-15 LAB — SARS CORONAVIRUS 2 BY RT PCR (HOSPITAL ORDER, PERFORMED IN ~~LOC~~ HOSPITAL LAB): SARS Coronavirus 2: NEGATIVE

## 2019-12-15 LAB — LACTIC ACID, PLASMA: Lactic Acid, Venous: 1.7 mmol/L (ref 0.5–1.9)

## 2019-12-15 MED ORDER — IOHEXOL 300 MG/ML  SOLN
75.0000 mL | Freq: Once | INTRAMUSCULAR | Status: AC | PRN
  Administered 2019-12-15: 75 mL via INTRAVENOUS

## 2019-12-15 MED ORDER — LATANOPROST 0.005 % OP SOLN
1.0000 [drp] | Freq: Every day | OPHTHALMIC | Status: DC
  Administered 2019-12-15 – 2019-12-24 (×9): 1 [drp] via OPHTHALMIC
  Filled 2019-12-15 (×3): qty 2.5

## 2019-12-15 MED ORDER — TIMOLOL MALEATE 0.5 % OP SOLN
1.0000 [drp] | Freq: Every day | OPHTHALMIC | Status: DC
  Administered 2019-12-15 – 2019-12-25 (×8): 1 [drp] via OPHTHALMIC
  Filled 2019-12-15 (×2): qty 5

## 2019-12-15 MED ORDER — ENSURE ENLIVE PO LIQD
237.0000 mL | Freq: Two times a day (BID) | ORAL | Status: DC
  Administered 2019-12-16 – 2019-12-23 (×13): 237 mL via ORAL

## 2019-12-15 MED ORDER — ATORVASTATIN CALCIUM 10 MG PO TABS
10.0000 mg | ORAL_TABLET | Freq: Every evening | ORAL | Status: DC
  Administered 2019-12-16 – 2019-12-20 (×5): 10 mg via ORAL
  Filled 2019-12-15 (×5): qty 1

## 2019-12-15 MED ORDER — SODIUM CHLORIDE 0.9 % IV SOLN: INTRAVENOUS | Status: AC

## 2019-12-15 MED ORDER — ENOXAPARIN SODIUM 40 MG/0.4ML ~~LOC~~ SOLN
40.0000 mg | SUBCUTANEOUS | Status: DC
  Administered 2019-12-15 – 2019-12-16 (×2): 40 mg via SUBCUTANEOUS
  Filled 2019-12-15 (×2): qty 0.4

## 2019-12-15 MED ORDER — SODIUM CHLORIDE 0.9 % IV SOLN
500.0000 mg | Freq: Once | INTRAVENOUS | Status: DC
  Administered 2019-12-15: 500 mg via INTRAVENOUS
  Filled 2019-12-15: qty 500

## 2019-12-15 MED ORDER — SODIUM CHLORIDE 0.9 % IV SOLN
2.0000 g | Freq: Once | INTRAVENOUS | Status: AC
  Administered 2019-12-15: 2 g via INTRAVENOUS
  Filled 2019-12-15: qty 20

## 2019-12-15 MED ORDER — SODIUM CHLORIDE 0.9 % IV SOLN: Freq: Once | INTRAVENOUS | Status: DC

## 2019-12-15 NOTE — ED Provider Notes (Signed)
[facility] Emergency Department Provider Note  ____________________________________________   First MD Initiated Contact with Patient 12/15/19 1158     (approximate)  I have reviewed the triage vital signs and the nursing notes.   HISTORY  Chief Complaint Altered Mental Status    HPI Autumn Bradley is a 86 y.o. female  With h/o AFib, HTN, HLD, here with generalized weakness. Per report, pt has had approx 1 month of progressively worsening weight loss, loss of appetite. She went to her PCP last week and was dx with UTI, told to monitor. She finished her ABX but has not had much improvement. OVer the weekend, pt has had increasing weakness, fatigue, and loss of appetite. She has been more confused and has begun hallucinating. She does not want to eat/drink. She's had difficulty getting around. Family also notes increased cough and apparent intermittent SOB, with occasional sputum production. H/o CHF but has not had any leg swelling. Has not been taking lasix 2/2 poor PO intake. No known fevers, chills. Denies any pain currently. No known sick contacts. No recent med changes other than cipro for her UTI and azithro called in over weekend for her cough.        Past Medical History:  Diagnosis Date  . Atrial fibrillation ([place])   . Cancer [facility])    SKIN CANCER  . Degenerative joint disease   . Glaucoma   . Hypercholesteremia   . Hypertension    CONTROLLED ON MEDS  . Shortness of breath dyspnea    [place]  . Wears dentures    partial upper and lower    Patient Active Problem List   Diagnosis Date Noted  . Recurrent left pleural effusion   . Goals of care, counseling/discussion   . Palliative care by specialist   . CHF (congestive heart failure) ([place]) 07/07/2018    Past Surgical History:  Procedure Laterality Date  . ABDOMINAL HYSTERECTOMY    . CARDIAC CATHETERIZATION  07/2012   [facility] - Neg for blockages  . CARDIAC ELECTROPHYSIOLOGY STUDY AND  ABLATION    . CATARACT EXTRACTION W/PHACO Left 03/17/2015   Procedure: CATARACT EXTRACTION PHACO AND INTRAOCULAR LENS PLACEMENT (IOC);  Surgeon: Leandrew Koyanagi, MD;  Location: [place];  Service: Ophthalmology;  Laterality: Left;  . CATARACT EXTRACTION W/PHACO Right 04/14/2015   Procedure: CATARACT EXTRACTION PHACO AND INTRAOCULAR LENS PLACEMENT (IOC);  Surgeon: Leandrew Koyanagi, MD;  Location: [place];  Service: Ophthalmology;  Laterality: Right;  . CHOLECYSTECTOMY    . COLONOSCOPY    . TONSILLECTOMY      Prior to Admission medications   Medication Sig Start Date End Date Taking? Authorizing Provider  apixaban (ELIQUIS) 5 MG TABS tablet Take 5 mg by mouth in the morning and at bedtime.    Yes [provider]  atorvastatin (LIPITOR) 10 MG tablet Take 10 mg by mouth every evening.    Yes [provider]  Cholecalciferol (VITAMIN D3) 50 MCG (2000 UT) capsule Take 2,000 Units by mouth daily.    Yes [provider]  furosemide (LASIX) 20 MG tablet Take 20 mg by mouth daily as needed for fluid or edema.   Yes [provider]  latanoprost (XALATAN) 0.005 % ophthalmic solution Place 1 drop into both eyes at bedtime.    Yes [provider]  potassium chloride (K-DUR,KLOR-CON) 10 MEQ tablet Take 10 mEq by mouth daily as needed (potassium replacement with Lasix).    Yes [provider]  TIADYLT ER 300 MG 24 hr capsule Take 300 mg by mouth at bedtime. 12/14/19  Yes [provider]  timolol (TIMOPTIC) 0.5 % ophthalmic solution Place 1 drop into both eyes daily.   Yes [provider]  vitamin B-12 (CYANOCOBALAMIN) 1000 MCG tablet Take 1,000 mcg by mouth daily.   Yes [provider]    Allergies Patient has no known allergies.  History reviewed. No pertinent family history.  Social History Social History   Tobacco Use  . Smoking status: Never Smoker  . Smokeless tobacco: Never Used    Substance Use Topics  . Alcohol use: No  . Drug use: Not on file    Review of Systems  Review of Systems  Constitutional: Positive for appetite change, fatigue and unexpected weight change. Negative for fever.  HENT: Negative for congestion and sore throat.   Eyes: Negative for visual disturbance.  Respiratory: Positive for cough and shortness of breath.   Cardiovascular: Negative for chest pain.  Gastrointestinal: Negative for abdominal pain, diarrhea, nausea and vomiting.  Genitourinary: Negative for flank pain.  Musculoskeletal: Negative for back pain and neck pain.  Skin: Negative for rash and wound.  Neurological: Positive for weakness.  All other systems reviewed and are negative.    ____________________________________________  PHYSICAL EXAM:      VITAL SIGNS: ED Triage Vitals  Enc Vitals Group     BP 12/15/19 1134 104/67     Pulse Rate 12/15/19 1134 76     Resp 12/15/19 1134 16     Temp 12/15/19 1134 97.8 F (36.6 C)     Temp Source 12/15/19 1134 Axillary     SpO2 12/15/19 1134 92 %     Weight 12/15/19 1137 125 lb (56.7 kg)     Height 12/15/19 1137 5\' 6"  (1.676 m)     Head Circumference --      Peak Flow --      Pain Score 12/15/19 1137 0     Pain Loc --      Pain Edu? --      Excl. in [place]? --      Physical Exam Vitals and nursing note reviewed.  Constitutional:      General: She is not in acute distress.    Appearance: She is well-developed.     Comments: Thin, frail, elderly  HENT:     Head: Normocephalic and atraumatic.     Mouth/Throat:     Mouth: Mucous membranes are dry.  Eyes:     Conjunctiva/sclera: Conjunctivae normal.  Cardiovascular:     Rate and Rhythm: Normal rate. Rhythm irregular.     Heart sounds: Normal heart sounds. No murmur heard.  No friction rub.  Pulmonary:     Effort: Pulmonary effort is normal. No respiratory distress.     Breath sounds: Rhonchi and rales (bilateral) present. No wheezing.  Abdominal:     General: There  is no distension.     Palpations: Abdomen is soft.     Tenderness: There is no abdominal tenderness.  Musculoskeletal:     Cervical back: Neck supple.     Right lower leg: No edema.     Left lower leg: No edema.  Skin:    General: Skin is warm.     Capillary Refill: Capillary refill takes less than 2 seconds.  Neurological:     Mental Status: She is alert and oriented to person, place, and time.     Motor: No abnormal muscle tone.  Comments: Oriented, MAE, follows commands. Normal sensation to light touch b/l UE and LE.       ____________________________________________   LABS (all labs ordered are listed, but only abnormal results are displayed)  Labs Reviewed  CBC - Abnormal; Notable for the following components:      Result Value   WBC 3.7 (*)    RBC 5.50 (*)    Hemoglobin 16.6 (*)    HCT 46.8 (*)    Platelets 94 (*)    All other components within normal limits  COMPREHENSIVE METABOLIC PANEL - Abnormal; Notable for the following components:   Sodium 127 (*)    Chloride 92 (*)    Glucose, Bld 149 (*)    Calcium 8.5 (*)    Total Protein 5.7 (*)    AST 70 (*)    Total Bilirubin 1.6 (*)    GFR calc non Af Amer 56 (*)    All other components within normal limits  URINALYSIS, COMPLETE (UACMP) WITH MICROSCOPIC - Abnormal; Notable for the following components:   Color, Urine AMBER (*)    APPearance TURBID (*)    Hgb urine dipstick SMALL (*)    Protein, ur >=300 (*)    Bacteria, UA FEW (*)    All other components within normal limits  BRAIN NATRIURETIC PEPTIDE - Abnormal; Notable for the following components:   B Natriuretic Peptide 781.6 (*)    All other components within normal limits  TROPONIN I (HIGH SENSITIVITY) - Abnormal; Notable for the following components:   Troponin I (High Sensitivity) 68 (*)    All other components within normal limits  URINE CULTURE  SARS CORONAVIRUS 2 BY RT PCR (HOSPITAL ORDER, [place] [facility])  CULTURE, BLOOD  (ROUTINE X 2)  CULTURE, BLOOD (ROUTINE X 2)  LACTIC ACID, PLASMA  LACTIC ACID, PLASMA  PROCALCITONIN    ____________________________________________  EKG: Atrial fibrillation, VR 79. QRS 93, QTc 601. Low voltage diffusely, unchanged from prior Frequent PVCs. No acute ST elevations. ________________________________________  RADIOLOGY All imaging, including plain films, CT scans, and ultrasounds, independently reviewed by me, and interpretations confirmed via formal radiology reads.  ED MD interpretation:   CXR: New ill defined opacities in left lung, cannot exclude pulm mets, marked cardiomegaly unchanged, small L pleural effusion  Official radiology report(s): CT Head Wo Contrast  Result Date: 12/15/2019 CLINICAL DATA:  Altered mental status.  Recent fall at home. EXAM: CT HEAD WITHOUT CONTRAST TECHNIQUE: Contiguous axial images were obtained from the base of the skull through the vertex without intravenous contrast. COMPARISON:  12/04/2019 FINDINGS: Brain: Generalized atrophy. No focal finding affects the brainstem or cerebellum. Cerebral hemispheres show chronic small-vessel ischemic changes throughout the white matter. No cortical or large vessel territory infarction. No mass lesion, hemorrhage, hydrocephalus or extra-axial collection. Vascular: There is atherosclerotic calcification of the major vessels at the base of the brain. Skull: Negative Sinuses/Orbits: Clear/normal Other: None IMPRESSION: No acute or reversible finding. No intracranial hemorrhage. Age related atrophy. Chronic small-vessel ischemic changes of the cerebral hemispheric white matter. Electronically Signed   By: Nelson Chimes M.D.   On: 12/15/2019 13:28   CT Chest W Contrast  Result Date: 12/15/2019 CLINICAL DATA:  Persistent cough. EXAM: CT CHEST WITH CONTRAST TECHNIQUE: Multidetector CT imaging of the chest was performed during intravenous contrast administration. CONTRAST:  68mL OMNIPAQUE IOHEXOL 300 MG/ML  SOLN  COMPARISON:  September 18, 2017. FINDINGS: Cardiovascular: Atherosclerosis of thoracic aorta is noted without aneurysm or dissection. Large pericardial effusion  is again noted as described on prior exam. Mediastinum/Nodes: 3.0 cm right thyroid nodule is noted which contains multiple calcifications and is unchanged compared to prior exam. The esophagus is unremarkable. No definite adenopathy is noted. Lungs/Pleura: Moderate and probably loculated left pleural effusion is again noted. Small right pleural effusion is also noted which does not appear to be significantly changed. No pneumothorax is noted. Multiple rounded ill-defined opacities are noted throughout both lungs, most prominently seen in both lower lobes. These most likely represent multifocal pneumonia, potentially of viral etiology, but metastatic disease cannot be excluded. Upper Abdomen: There is interval development of several rounded low densities within the liver. Musculoskeletal: No chest wall abnormality. No acute or significant osseous findings. IMPRESSION: 1. Multiple rounded ill-defined opacities are noted throughout both lungs, but most prominently seen in the lung bases. These most likely represent multifocal pneumonia, potentially of viral etiology, but metastatic disease cannot be excluded. Further evaluation with unenhanced chest CT in 2-3 weeks is recommended to ensure resolution or stability. 2. Large pericardial effusion is again noted as described on prior exam. 3. Moderate and probably loculated left pleural effusion is noted which does not appear to be significantly changed. 4. Small right pleural effusion is also noted which does not appear to be significantly changed. 5. Interval development of several rounded low densities within the liver concerning for metastatic disease. MRI of the liver with and without gadolinium administration is recommended for further evaluation. 6. Stable 3.0 cm right thyroid nodule is noted which contains  multiple calcifications and is unchanged compared to prior exam. In the setting of significant comorbidities or limited life expectancy, no follow-up recommended. (Ref: J Am Coll Radiol. 2015 Feb;12(2): 143-50). Aortic Atherosclerosis (ICD10-I70.0). Electronically Signed   By: Marijo Conception M.D.   On: 12/15/2019 13:30   DG Chest Port 1 View  Result Date: 12/15/2019 CLINICAL DATA:  Altered mental status. Weakness. Recent fall. Pericardial effusion. EXAM: PORTABLE CHEST 1 VIEW COMPARISON:  07/08/2018, and chest CT on 09/18/2017 FINDINGS: Marked cardiac enlargement again seen, consistent with large pericardial effusion seen on prior CT. Aortic atherosclerosis noted. Small left pleural effusion or pleural thickening remains stable. Several ill-defined nodular opacities are seen in the left lung which are new since previous study. Pulmonary metastases cannot be excluded. Right lung is clear. IMPRESSION: 1. New ill-defined nodular opacities in left lung. Pulmonary metastases cannot be excluded. Consider further evaluation with chest CT with contrast. 2. Stable marked cardiac enlargement, consistent with large pericardial effusion. 3. Stable small left pleural effusion versus pleural thickening. Electronically Signed   By: Marlaine Hind M.D.   On: 12/15/2019 12:11    ____________________________________________  PROCEDURES   Procedure(s) performed (including Critical Care):  .Critical Care Performed by: Duffy Bruce, MD Authorized by: Duffy Bruce, MD   Critical care provider statement:    Critical care time (minutes):  35   Critical care time was exclusive of:  Separately billable procedures and treating other patients and teaching time   Critical care was time spent personally by me on the following activities:  Development of treatment plan with patient or surrogate, discussions with consultants, evaluation of patient's response to treatment, examination of patient, obtaining history from  patient or surrogate, ordering and performing treatments and interventions, ordering and review of laboratory studies, ordering and review of radiographic studies, pulse oximetry, re-evaluation of patient's condition and review of old charts   I assumed direction of critical care for this patient from another provider in my specialty: no   .  1-3 Lead EKG Interpretation Performed by: Duffy Bruce, MD Authorized by: Duffy Bruce, MD     Interpretation: non-specific     ECG rate:  70-80   ECG rate assessment: normal     Rhythm: atrial fibrillation     Ectopy: PVCs     Conduction: normal   Comments:     Indication: CAP, resp distress    ____________________________________________  INITIAL IMPRESSION / MDM / ASSESSMENT AND PLAN / ED COURSE  As part of my medical decision making, I reviewed the following data within the [place] notes reviewed and incorporated, Old chart reviewed, Notes from prior ED visits, and [place] Controlled Substance Database       *SHERITTA DEEG was evaluated in Emergency Department on 12/15/2019 for the symptoms described in the history of present illness. She was evaluated in the context of the global COVID-19 pandemic, which necessitated consideration that the patient might be at risk for infection with the SARS-CoV-2 virus that causes COVID-19. Institutional protocols and algorithms that pertain to the evaluation of patients at risk for COVID-19 are in a state of rapid change based on information released by regulatory bodies including the CDC and federal and state organizations. These policies and algorithms were followed during the patient's care in the ED.  Some ED evaluations and interventions may be delayed as a result of limited staffing during the pandemic.*  Clinical Course as of Dec 14 1413  Mon Dec 15, 2019  1321 86 yo F here with AMS/encephalopathy. DDx is broad, includes UTI, PNA, metabolic encephalopathy, CVA. CT head, labs  and imaging ordered.   [CI]    Clinical Course User Index [CI] Duffy Bruce, MD    Medical Decision Making:  Labs as above. CT reviewed. CT shows likely multifocal PNA but also findings concerning for metastatic disease. Pt also with hyponatremia, elevated BNP and trop c/w ChF though clinically she is mildly dehydrated. Will admit for ABX, further work-up.    ___________________________________________  FINAL CLINICAL IMPRESSION(S) / ED DIAGNOSES  Final diagnoses:  Community acquired pneumonia, unspecified laterality  Pulmonary nodules  Encephalopathy     MEDICATIONS GIVEN DURING THIS VISIT:  Medications  cefTRIAXone (ROCEPHIN) 2 g in sodium chloride 0.9 % 100 mL IVPB (has no administration in time range)  azithromycin (ZITHROMAX) 500 mg in sodium chloride 0.9 % 250 mL IVPB (has no administration in time range)  0.9 %  sodium chloride infusion (has no administration in time range)  iohexol (OMNIPAQUE) 300 MG/ML solution 75 mL (75 mLs Intravenous Contrast Given 12/15/19 1301)     ED Discharge Orders    None       Note:  This document was prepared using Dragon voice recognition software and may include unintentional dictation errors.   Duffy Bruce, MD 12/15/19 1415

## 2019-12-15 NOTE — H&P (Signed)
History and Physical    Autumn Bradley:[id number] DOB: [birth year] DOA: 12/15/2019  PCP: Adin Hector, MD  Patient coming from: home  I have personally briefly reviewed patient's old medical records in [place]  Chief Complaint: recent fall  HPI: Autumn Bradley is a 86 y.o. Caucasian female with medical history significant of systolic CHF, Afib on Eliquis, pleural effusions and pericardial effusion who presented with a myriad of complaints.  Hx obtained from daughter-in-law and pt together.  Pt reported having coughing, used to be productive of sputum, for the past month.  No dyspnea, no fever.  Just recently finished a course of Z-pak.  Family noted pt to be more confused and having hallucination all the time also for the past month, and etiology was thought to be UTI and pt finished a course of Cipro.  Pt also had poor appetite and lost about 80 lbs in 1 year.  Pt has been weak and fell about a week ago causing significant bruising to her face.  Currently has no pain, vomiting, diarrhea, increased swelling.  Had dry heaving this morning.  Pt has known pleural effusion and had thoracentesis in the past.  Has known pulmonary nodules.  Pt is a never smoker.     ED Course: initial vitals: afebrile, pulse 76, BP 104/67, sating 92% on room air.  Labs notable for Na 127, WBC 3.7, procal <0.1, BNP 781, trop 60's flat, lactic acid 1.7.  UA neg for infection.  CT head no acute finding.  CT chest showed "Multiple rounded ill-defined opacities are noted throughout both lungs", Large pericardial effusion (known prior), "Moderate and probably loculated left pleural effusion", and "several rounded low densities within the liver concerning for metastatic disease."  Pt was started on ceftriaxone and azithromycin in the ED before admission.   Assessment/Plan Active Problems:   AMS (altered mental status)  AMS  --alert, responsive, however not completely oriented and family noted frequent  hallucinations.  CT head neg.  Labs only remarkable for Na 127, may be volume down however Cr/BUN wnl.   Does not appear to have infection.  Pt has been having progressive decline for the past year, with weight loss and weakness leading to falls.  CT imaging concerning for malignancy.   Nodular opacities in lungs and liver Weight loss --concerning for cancer. PLAN: --oncology consult in the morning  Hyponatremia --Na 127 on presentation, may be volume down however Cr/BUN wnl.  Pt did admit to poor PO intake. PLAN: --NS'@75'$  for 12 hours (cautious IVF since pt has hx of severely reduced LVEF) --Encourage oral hydration.  Cough --present for about a month.  No fever, leukocytosis or hypoxia on presentation.  Chest CT showed opacities, however, procal neg.  Recently finished Z-pak with no improvement. --started on ceftriaxone and azithromycin in the ED PLAN: --d/c abx since no signs of infection.  Bilateral pleural effusions, chronic --L>R.  Left side probably loculated.  Had left thoracentesis back on 07/08/18.   --No current need for thoracentesis.  Large pericardial effusion, chronic, stable --Known condition in Jan 2020, appeared to be stable since then.  Afib on Eliquis --currently rate controlled.   PLAN: --Hold home TIADYLT due to soft BP. --Hold Eliquis for now in case pt is going for biopsy  Hx of chronic systolic CHF with LVEF 18-84% --BNP 781, but does not appear fluid overloaded.  Only on Lasix 20 mg PRN at home. PLAN: --Hold diuretic since BP low.  Glaucoma --continue home  eye drops  Weakness and falls --PT   DVT prophylaxis: Lovenox SQ Code Status: DNR Confirmed with pt and daughter-in-law Family Communication: daughter-in-law updated at bedside on admission  Disposition Plan: likely SNF  Consults called: not yet Admission status: Inpatient   Review of Systems: As per HPI otherwise 10 point review of systems negative.   Past Medical History:  Diagnosis  Date  . Atrial fibrillation ([place])   . Cancer [facility])    SKIN CANCER  . Degenerative joint disease   . Glaucoma   . Hypercholesteremia   . Hypertension    CONTROLLED ON MEDS  . Shortness of breath dyspnea    [place]  . Wears dentures    partial upper and lower    Past Surgical History:  Procedure Laterality Date  . ABDOMINAL HYSTERECTOMY    . CARDIAC CATHETERIZATION  07/2012   [facility] - Neg for blockages  . CARDIAC ELECTROPHYSIOLOGY STUDY AND ABLATION    . CATARACT EXTRACTION W/PHACO Left 03/17/2015   Procedure: CATARACT EXTRACTION PHACO AND INTRAOCULAR LENS PLACEMENT (IOC);  Surgeon: Leandrew Koyanagi, MD;  Location: [place];  Service: Ophthalmology;  Laterality: Left;  . CATARACT EXTRACTION W/PHACO Right 04/14/2015   Procedure: CATARACT EXTRACTION PHACO AND INTRAOCULAR LENS PLACEMENT (IOC);  Surgeon: Leandrew Koyanagi, MD;  Location: [place];  Service: Ophthalmology;  Laterality: Right;  . CHOLECYSTECTOMY    . COLONOSCOPY    . TONSILLECTOMY       reports that she has never smoked. She has never used smokeless tobacco. She reports that she does not drink alcohol. No history on file for drug use.  No Known Allergies  Family History  Problem Relation Age of Onset  . Diabetes Mellitus II Mother   . Heart failure Father   . COPD Sister   . Heart attack Sister   . Heart failure Sister   . Colon cancer Brother   . Kidney cancer Brother   . Lung cancer Brother      Prior to Admission medications   Medication Sig Start Date End Date Taking? Authorizing Provider  apixaban (ELIQUIS) 5 MG TABS tablet Take 5 mg by mouth in the morning and at bedtime.    Yes [provider]  atorvastatin (LIPITOR) 10 MG tablet Take 10 mg by mouth every evening.    Yes [provider]  Cholecalciferol (VITAMIN D3) 50 MCG (2000 UT) capsule Take 2,000 Units by mouth daily.    Yes [provider]  furosemide (LASIX) 20 MG tablet Take 20 mg by  mouth daily as needed for fluid or edema.   Yes [provider]  latanoprost (XALATAN) 0.005 % ophthalmic solution Place 1 drop into both eyes at bedtime.    Yes [provider]  potassium chloride (K-DUR,KLOR-CON) 10 MEQ tablet Take 10 mEq by mouth daily as needed (potassium replacement with Lasix).    Yes [provider]  TIADYLT ER 300 MG 24 hr capsule Take 300 mg by mouth at bedtime. 12/14/19  Yes [provider]  timolol (TIMOPTIC) 0.5 % ophthalmic solution Place 1 drop into both eyes daily.   Yes [provider]  vitamin B-12 (CYANOCOBALAMIN) 1000 MCG tablet Take 1,000 mcg by mouth daily.   Yes [provider]    Physical Exam: Vitals:   12/15/19 1137 12/15/19 1437 12/15/19 1600 12/15/19 1659  BP:  106/75 100/84 (!) 90/47  Pulse:  77 83 87  Resp:  16  18  Temp:    98.2  F (36.8 C)  TempSrc:    Oral  SpO2:  95% 99% 97%  Weight: 56.7 kg     Height: '5\' 6"'$  (1.676 m)       Constitutional: NAD, alert, oriented to person and place, coherent and able to answer questions HEENT: conjunctivae and lids normal, EOMI, purple bruising around left eye and cheek CV: RRR no M,R,G. Distal pulses +2.  No cyanosis.   RESP: crackles over posterior left mid to low lung fields, normal respiratory effort, on RA GI: +BS, NTND Extremities: No effusions, edema, or tenderness in BLE SKIN: warm, dry.  Large amount of bruising Neuro: II - XII grossly intact.  Sensation intact Psych: Normal mood and affect.     Labs on Admission: I have personally reviewed following labs and imaging studies  CBC: Recent Labs  Lab 12/15/19 1153  WBC 3.7*  HGB 16.6*  HCT 46.8*  MCV 85.1  PLT 94*   Basic Metabolic Panel: Recent Labs  Lab 12/15/19 1153  NA 127*  K 4.7  CL 92*  CO2 26  GLUCOSE 149*  BUN 18  CREATININE 0.93  CALCIUM 8.5*   GFR: Estimated Creatinine Clearance: 38.9 mL/min (by C-G formula based on SCr of 0.93 mg/dL). Liver Function  Tests: Recent Labs  Lab 12/15/19 1153  AST 70*  ALT 27  ALKPHOS 72  BILITOT 1.6*  PROT 5.7*  ALBUMIN 3.5   No results for input(s): LIPASE, AMYLASE in the last 168 hours. No results for input(s): AMMONIA in the last 168 hours. Coagulation Profile: No results for input(s): INR, PROTIME in the last 168 hours. Cardiac Enzymes: No results for input(s): CKTOTAL, CKMB, CKMBINDEX, TROPONINI in the last 168 hours. BNP (last 3 results) No results for input(s): PROBNP in the last 8760 hours. HbA1C: No results for input(s): HGBA1C in the last 72 hours. CBG: No results for input(s): GLUCAP in the last 168 hours. Lipid Profile: No results for input(s): CHOL, HDL, LDLCALC, TRIG, CHOLHDL, LDLDIRECT in the last 72 hours. Thyroid Function Tests: No results for input(s): TSH, T4TOTAL, FREET4, T3FREE, THYROIDAB in the last 72 hours. Anemia Panel: No results for input(s): VITAMINB12, FOLATE, FERRITIN, TIBC, IRON, RETICCTPCT in the last 72 hours. Urine analysis:    Component Value Date/Time   COLORURINE AMBER (A) 12/15/2019 1153   APPEARANCEUR TURBID (A) 12/15/2019 1153   LABSPEC 1.026 12/15/2019 1153   PHURINE 5.0 12/15/2019 1153   GLUCOSEU NEGATIVE 12/15/2019 1153   HGBUR SMALL (A) 12/15/2019 1153   BILIRUBINUR NEGATIVE 12/15/2019 1153   KETONESUR NEGATIVE 12/15/2019 1153   PROTEINUR >=300 (A) 12/15/2019 1153   NITRITE NEGATIVE 12/15/2019 1153   LEUKOCYTESUR NEGATIVE 12/15/2019 1153    Radiological Exams on Admission: CT Head Wo Contrast  Result Date: 12/15/2019 CLINICAL DATA:  Altered mental status.  Recent fall at home. EXAM: CT HEAD WITHOUT CONTRAST TECHNIQUE: Contiguous axial images were obtained from the base of the skull through the vertex without intravenous contrast. COMPARISON:  12/04/2019 FINDINGS: Brain: Generalized atrophy. No focal finding affects the brainstem or cerebellum. Cerebral hemispheres show chronic small-vessel ischemic changes throughout the white matter. No  cortical or large vessel territory infarction. No mass lesion, hemorrhage, hydrocephalus or extra-axial collection. Vascular: There is atherosclerotic calcification of the major vessels at the base of the brain. Skull: Negative Sinuses/Orbits: Clear/normal Other: None IMPRESSION: No acute or reversible finding. No intracranial hemorrhage. Age related atrophy. Chronic small-vessel ischemic changes of the cerebral hemispheric white matter. Electronically Signed   By: Jan Fireman.D.  On: 12/15/2019 13:28   CT Chest W Contrast  Result Date: 12/15/2019 CLINICAL DATA:  Persistent cough. EXAM: CT CHEST WITH CONTRAST TECHNIQUE: Multidetector CT imaging of the chest was performed during intravenous contrast administration. CONTRAST:  51m OMNIPAQUE IOHEXOL 300 MG/ML  SOLN COMPARISON:  September 18, 2017. FINDINGS: Cardiovascular: Atherosclerosis of thoracic aorta is noted without aneurysm or dissection. Large pericardial effusion is again noted as described on prior exam. Mediastinum/Nodes: 3.0 cm right thyroid nodule is noted which contains multiple calcifications and is unchanged compared to prior exam. The esophagus is unremarkable. No definite adenopathy is noted. Lungs/Pleura: Moderate and probably loculated left pleural effusion is again noted. Small right pleural effusion is also noted which does not appear to be significantly changed. No pneumothorax is noted. Multiple rounded ill-defined opacities are noted throughout both lungs, most prominently seen in both lower lobes. These most likely represent multifocal pneumonia, potentially of viral etiology, but metastatic disease cannot be excluded. Upper Abdomen: There is interval development of several rounded low densities within the liver. Musculoskeletal: No chest wall abnormality. No acute or significant osseous findings. IMPRESSION: 1. Multiple rounded ill-defined opacities are noted throughout both lungs, but most prominently seen in the lung bases. These most  likely represent multifocal pneumonia, potentially of viral etiology, but metastatic disease cannot be excluded. Further evaluation with unenhanced chest CT in 2-3 weeks is recommended to ensure resolution or stability. 2. Large pericardial effusion is again noted as described on prior exam. 3. Moderate and probably loculated left pleural effusion is noted which does not appear to be significantly changed. 4. Small right pleural effusion is also noted which does not appear to be significantly changed. 5. Interval development of several rounded low densities within the liver concerning for metastatic disease. MRI of the liver with and without gadolinium administration is recommended for further evaluation. 6. Stable 3.0 cm right thyroid nodule is noted which contains multiple calcifications and is unchanged compared to prior exam. In the setting of significant comorbidities or limited life expectancy, no follow-up recommended. (Ref: J Am Coll Radiol. 2015 Feb;12(2): 143-50). Aortic Atherosclerosis (ICD10-I70.0). Electronically Signed   By: JMarijo ConceptionM.D.   On: 12/15/2019 13:30   DG Chest Port 1 View  Result Date: 12/15/2019 CLINICAL DATA:  Altered mental status. Weakness. Recent fall. Pericardial effusion. EXAM: PORTABLE CHEST 1 VIEW COMPARISON:  07/08/2018, and chest CT on 09/18/2017 FINDINGS: Marked cardiac enlargement again seen, consistent with large pericardial effusion seen on prior CT. Aortic atherosclerosis noted. Small left pleural effusion or pleural thickening remains stable. Several ill-defined nodular opacities are seen in the left lung which are new since previous study. Pulmonary metastases cannot be excluded. Right lung is clear. IMPRESSION: 1. New ill-defined nodular opacities in left lung. Pulmonary metastases cannot be excluded. Consider further evaluation with chest CT with contrast. 2. Stable marked cardiac enlargement, consistent with large pericardial effusion. 3. Stable small left  pleural effusion versus pleural thickening. Electronically Signed   By: JMarlaine HindM.D.   On: 12/15/2019 12:11      TEnzo BiMD [facility]  If 7PM-7AM, please contact night-coverage 12/15/2019, 6:02 PM

## 2019-12-15 NOTE — Progress Notes (Signed)
[place] guidelines implemented. Continuing to monitor.   12/15/19 2000  Assess: MEWS Score  Temp 97.6 F (36.4 C)  BP 91/62  Pulse Rate 76  ECG Heart Rate 77  Resp (!) 29  SpO2 92 %  O2 Device Room Air  Assess: MEWS Score  MEWS Temp 0  MEWS Systolic 1  MEWS Pulse 0  MEWS RR 2  MEWS LOC 0  MEWS Score 3  MEWS Score Color Yellow  Assess: if the MEWS score is Yellow or Red  Were vital signs taken at a resting state? Yes  Focused Assessment Documented focused assessment (see shift assessment 0800)  Early Detection of Sepsis Score *See Row Information* Low  MEWS guidelines implemented *See Row Information* Yes  Treat  MEWS Interventions Escalated (See documentation below)  Take Vital Signs  Increase Vital Sign Frequency  Yellow: Q 2hr X 2 then Q 4hr X 2, if remains yellow, continue Q 4hrs  Escalate  MEWS: Escalate Yellow: discuss with charge nurse/RN and consider discussing with provider and RRT  Notify: Charge Nurse/RN  Name of Charge Nurse/RN Notified Kennyth Lose Page RN  Date Charge Nurse/RN Notified 12/15/19  Time Charge Nurse/RN Notified 2030  Document  Patient Outcome Other (Comment) (continuing to monitor)

## 2019-12-15 NOTE — ED Triage Notes (Signed)
Pt arrived via EMS from home d/t increased AMS and a recent fall at home. Pt was recently treated for UTI as well.

## 2019-12-16 ENCOUNTER — Inpatient Hospital Stay: Payer: Medicare HMO

## 2019-12-16 ENCOUNTER — Encounter: Payer: Self-pay | Admitting: Hospitalist

## 2019-12-16 DIAGNOSIS — R918 Other nonspecific abnormal finding of lung field: Secondary | ICD-10-CM

## 2019-12-16 DIAGNOSIS — D6959 Other secondary thrombocytopenia: Secondary | ICD-10-CM

## 2019-12-16 DIAGNOSIS — C7800 Secondary malignant neoplasm of unspecified lung: Secondary | ICD-10-CM

## 2019-12-16 DIAGNOSIS — R6251 Failure to thrive (child): Secondary | ICD-10-CM

## 2019-12-16 DIAGNOSIS — C787 Secondary malignant neoplasm of liver and intrahepatic bile duct: Secondary | ICD-10-CM

## 2019-12-16 DIAGNOSIS — D72829 Elevated white blood cell count, unspecified: Secondary | ICD-10-CM

## 2019-12-16 DIAGNOSIS — Z66 Do not resuscitate: Secondary | ICD-10-CM

## 2019-12-16 DIAGNOSIS — E43 Unspecified severe protein-calorie malnutrition: Secondary | ICD-10-CM | POA: Insufficient documentation

## 2019-12-16 DIAGNOSIS — Z515 Encounter for palliative care: Secondary | ICD-10-CM

## 2019-12-16 DIAGNOSIS — R627 Adult failure to thrive: Secondary | ICD-10-CM

## 2019-12-16 LAB — URINE CULTURE: Culture: NO GROWTH

## 2019-12-16 LAB — CBC
HCT: 47.8 % — ABNORMAL HIGH (ref 36.0–46.0)
Hemoglobin: 16.4 g/dL — ABNORMAL HIGH (ref 12.0–15.0)
MCH: 30 pg (ref 26.0–34.0)
MCHC: 34.3 g/dL (ref 30.0–36.0)
MCV: 87.4 fL (ref 80.0–100.0)
Platelets: 92 10*3/uL — ABNORMAL LOW (ref 150–400)
RBC: 5.47 MIL/uL — ABNORMAL HIGH (ref 3.87–5.11)
RDW: 13.6 % (ref 11.5–15.5)
WBC: 3.1 10*3/uL — ABNORMAL LOW (ref 4.0–10.5)
nRBC: 0 % (ref 0.0–0.2)

## 2019-12-16 LAB — MAGNESIUM: Magnesium: 1.8 mg/dL (ref 1.7–2.4)

## 2019-12-16 LAB — BASIC METABOLIC PANEL
Anion gap: 12 (ref 5–15)
BUN: 16 mg/dL (ref 8–23)
CO2: 24 mmol/L (ref 22–32)
Calcium: 8 mg/dL — ABNORMAL LOW (ref 8.9–10.3)
Chloride: 93 mmol/L — ABNORMAL LOW (ref 98–111)
Creatinine, Ser: 0.85 mg/dL (ref 0.44–1.00)
GFR calc Af Amer: >60 mL/min (ref >60)
GFR calc non Af Amer: >60 mL/min (ref >60)
Glucose, Bld: 113 mg/dL — ABNORMAL HIGH (ref 70–99)
Potassium: 4.4 mmol/L (ref 3.5–5.1)
Sodium: 129 mmol/L — ABNORMAL LOW (ref 135–145)

## 2019-12-16 MED ORDER — IOHEXOL 300 MG/ML  SOLN
75.0000 mL | Freq: Once | INTRAMUSCULAR | Status: AC | PRN
  Administered 2019-12-16: 18:00:00 75 mL via INTRAVENOUS

## 2019-12-16 MED ORDER — IOHEXOL 9 MG/ML PO SOLN
500.0000 mL | ORAL | Status: AC
  Administered 2019-12-16: 500 mL via ORAL

## 2019-12-16 MED ORDER — DILTIAZEM HCL ER COATED BEADS 180 MG PO CP24
300.0000 mg | ORAL_CAPSULE | Freq: Every day | ORAL | Status: DC
  Administered 2019-12-16 – 2019-12-20 (×4): 300 mg via ORAL
  Filled 2019-12-16 (×5): qty 1

## 2019-12-16 MED ORDER — DILTIAZEM HCL ER BEADS 300 MG PO CP24: 300.0000 mg | ORAL_CAPSULE | Freq: Every day | ORAL | Status: DC

## 2019-12-16 NOTE — Consult Note (Signed)
[facility]  Date of admission:  12/15/2019  Inpatient day:  12/16/2019  Consulting physician:  Dr Lorella Nimrod   Reason for Consultation:  Imaging concerning for metastatic disease with unknown primary.  Chief Complaint: Autumn Bradley is a 86 y.o. female with a history of CHF, pleural and pericardial effusions who was admitted through the ER with altered mental status, hyponatremia, weight loss, and nodular opacities in the lungs.  HPI:  The patient has a history of CHF (EF 20-25%).  She last admitted to [facility] from 07/07/2018 - 07/11/2018 with shortness of breath, recurrent left pleural effusion, and atrial fibrillation with RVR.  She underwent thoracentesis.  Pleural fluid on 07/08/2018 revealed no evidence of malignancy.  CBC on 07/08/2018 revealed a hematocrit of 43.3, hemoglobin 14.1, MCV 93.9, platelets 176,000, WBC 4100.  Discharge weight was 62.5 kg (137.5 pounds).  The family notes a weight loss of 80 pounds in the past 1-2 years.  They note that she became different after Mother's Day (11/09/2019).  They describe her not talking much.  Two weeks ago, she became disoriented.  She has had periods of confusion.  She was coughing up phlegm.  At times, phlegm was pink in color.  Two weeks ago, she was treated for a UTI.  Antibiotics completed on 12/12/2019.  A Z-pack was started on 12/12/2019 for her cough.    Her appetite has been decreased.  She has not wanted to eat.  She has had no pain.  She fell out of bed on Sunday, 12/14/2019 on her left side with resultant bruises.  She has had increased weakness.  She has been staying in bed.  She was brought into the ER on 12/15/2019.  CBC revealed a hematocrit of 4.8, hemoglobin 16.6, MCV 85.1, platelets 94,000, WBC 3700.  Sodium was 127,  Creatinine was 0.93.  AST 70, ALT 27, bilirubin 1.6, and alkaline phosphatase 72.  She was started on ceftriaxone and azithromycin.  Head CT without contrast on 12/15/2019 revealed atrophy  and chronic vessel ischemic changes.  Chest CT with contrast on 12/15/2019 revealed multiple rounded ill-defined opacities throughout both lungs, but most prominently seen in the lung bases. Etiology is likely multifocal pneumonia, potentially of viral etiology, but metastatic disease cannot be excluded.  Chest CT in 2-3 weeks is recommended.  There was a large pericardial effusion and moderate,  probably loculated left pleural effusion which appears stable.  There was a small stable right pleural effusion.  There were several new rounded low densities within the liver concerning for metastatic disease. MRI of the liver with and without gadolinium administration was recommended for further evaluation.  There was a stable 3.0 cm right thyroid nodule with multiple calcifications.  Abdomen and pelvis CT on 12/16/2019 revealed numerous hepatic masses (largest 1.8 cm).  Dedicated liver MRI was recommended for complete characterization. Metastatic disease remained the diagnosis of exclusion.  There was nodular consolidation at the lung bases c/w metastatic disease.  There was a large stable pericardial effusion and bilateral pleural effusions.  There was a small amount of free fluid within the abdomen and pelvis.  Symptomatically, she denies any complaints.  She defers most of the questions to her family.   Past Medical History:  Diagnosis Date  . Atrial fibrillation ([place])   . Cancer [facility])    SKIN CANCER  . Degenerative joint disease   . Glaucoma   . Hypercholesteremia   . Hypertension    CONTROLLED ON MEDS  . Shortness of  breath dyspnea    [place]  . Wears dentures    partial upper and lower    Past Surgical History:  Procedure Laterality Date  . ABDOMINAL HYSTERECTOMY    . CARDIAC CATHETERIZATION  07/2012   [facility] - Neg for blockages  . CARDIAC ELECTROPHYSIOLOGY STUDY AND ABLATION    . CATARACT EXTRACTION W/PHACO Left 03/17/2015   Procedure: CATARACT EXTRACTION PHACO AND INTRAOCULAR  LENS PLACEMENT (IOC);  Surgeon: Leandrew Koyanagi, MD;  Location: [facility];  Service: Ophthalmology;  Laterality: Left;  . CATARACT EXTRACTION W/PHACO Right 04/14/2015   Procedure: CATARACT EXTRACTION PHACO AND INTRAOCULAR LENS PLACEMENT (IOC);  Surgeon: Leandrew Koyanagi, MD;  Location: [facility];  Service: Ophthalmology;  Laterality: Right;  . CHOLECYSTECTOMY    . COLONOSCOPY    . TONSILLECTOMY      Family History  Problem Relation Age of Onset  . Diabetes Mellitus II Mother   . Heart failure Father   . COPD Sister   . Heart attack Sister   . Heart failure Sister   . Colon cancer Brother   . Kidney cancer Brother   . Lung cancer Brother     Social History:  reports that she has never smoked. She has never used smokeless tobacco. She reports that she does not drink alcohol. No history on file for drug use..  The patient lives with her daughter Juliann Pulse.  Her medical power of attorney is her daughter-in-law, [name].  She is accompanied by her daughter-in-law, [name], and her son, Ronalee Belts, today.  Allergies: No Known Allergies  Medications Prior to Admission  Medication Sig Dispense Refill  . apixaban (ELIQUIS) 5 MG TABS tablet Take 5 mg by mouth in the morning and at bedtime.     Marland Kitchen atorvastatin (LIPITOR) 10 MG tablet Take 10 mg by mouth every evening.     . Cholecalciferol (VITAMIN D3) 50 MCG (2000 UT) capsule Take 2,000 Units by mouth daily.     . furosemide (LASIX) 20 MG tablet Take 20 mg by mouth daily as needed for fluid or edema.    Marland Kitchen latanoprost (XALATAN) 0.005 % ophthalmic solution Place 1 drop into both eyes at bedtime.     . potassium chloride (K-DUR,KLOR-CON) 10 MEQ tablet Take 10 mEq by mouth daily as needed (potassium replacement with Lasix).     Deborah Chalk ER 300 MG 24 hr capsule Take 300 mg by mouth at bedtime.    . timolol (TIMOPTIC) 0.5 % ophthalmic solution Place 1 drop into both eyes daily.    . vitamin B-12 (CYANOCOBALAMIN) 1000 MCG tablet Take  1,000 mcg by mouth daily.      Review of Systems: GENERAL:  Fatigued.  No fevers, sweats.  Weight loss of 80 pounds in the past 2 years.  Weight down 12 pounds since last admission. PERFORMANCE STATUS (ECOG):  3 HEENT:  No visual changes, runny nose, sore throat, mouth sores or tenderness. Lungs: Shortness of breath.  Cough.  Pink sputum. Cardiac:  CHF.  Atrial fibrillation.  No chest pain or palpitations. GI:  Decreased appetite.  Poor oral intake.  No nausea, vomiting, diarrhea, constipation, melena or hematochezia.  "Awhile since last colonoscopy". GU:  UTI prior to hospitalization.  No urgency, frequency, dysuria, or hematuria. Musculoskeletal:  General weakness s/p fall 1 week prior to admission.  No pain.  No muscle tenderness. Extremities:  No pain or swelling. Skin:  Bruising on left side of face and leg s/p fall 1 week ago.  No rashes  or skin changes. Neuro:  Hallucinations.  Confusion.  Decreased interaction with family.  No headache, focal numbness or weakness. Endocrine:  No diabetes, thyroid issues, hot flashes or night sweats. Psych:  No apparent mood changes, depression or anxiety. Pain:  No focal pain. Review of systems:  All other systems reviewed and found to be negative.  Physical Exam:  Blood pressure 107/69, pulse (!) 52, temperature 97.7 F (36.5 C), resp. rate 20, height 5\' 6"  (1.676 m), weight 125 lb (56.7 kg), SpO2 92 %.  GENERAL:  Elderly woman sitting comfortably on the medical unit in no acute distress. MENTAL STATUS:  Alert and oriented to person and place.  States year is 2021. HEAD:  Short gray hair.  Normocephalic, atraumatic, face symmetric, no Cushingoid features. EYES:  Pupils equal round and reactive to light and accomodation.  No conjunctivitis or scleral icterus. ENT:  Oropharynx clear without lesion.  Tongue normal. Mucous membranes moist.  RESPIRATORY:  Decreased breath sounds left lower lobe.  No rales, wheezes or rhonchi. CARDIOVASCULAR:   Irregular rhythm. No murmur, rub or gallop. ABDOMEN:  Soft, non-tender, with active bowel sounds, and no hepatosplenomegaly.  No masses. SKIN:  Ecchymosis left side of face and leg.  No rashes, ulcers or lesions. EXTREMITIES: No edema, no skin discoloration or tenderness.  No palpable cords. LYMPH NODES: No palpable cervical, supraclavicular, axillary or inguinal adenopathy  NEUROLOGICAL: Quiet.  Defers much of conversation to her daughter-in-law. Knows number of quarters in a dollar and nickels in a quarter.  Does not know President's name.  Remembers 3 of 3 words immediately.  Remembers 1 of 3 words in 10 minutes.  Moves all 4 extremities.   Results for orders placed or performed during the hospital encounter of 12/15/19 (from the past 48 hour(s))  CBC     Status: Abnormal   Collection Time: 12/15/19 11:53 AM  Result Value Ref Range   WBC 3.7 (L) 4.0 - 10.5 K/uL   RBC 5.50 (H) 3.87 - 5.11 MIL/uL   Hemoglobin 16.6 (H) 12.0 - 15.0 g/dL   HCT 46.8 (H) 36 - 46 %   MCV 85.1 80.0 - 100.0 fL   MCH 30.2 26.0 - 34.0 pg   MCHC 35.5 30.0 - 36.0 g/dL   RDW 13.7 11.5 - 15.5 %   Platelets 94 (L) 150 - 400 K/uL    Comment: Immature Platelet Fraction may be clinically indicated, consider ordering this additional test [id number]    nRBC 0.0 0.0 - 0.2 %    Comment: Performed at [facility], [address] [place], [place] [address]  Comprehensive metabolic panel     Status: Abnormal   Collection Time: 12/15/19 11:53 AM  Result Value Ref Range   Sodium 127 (L) 135 - 145 mmol/L   Potassium 4.7 3.5 - 5.1 mmol/L   Chloride 92 (L) 98 - 111 mmol/L   CO2 26 22 - 32 mmol/L   Glucose, Bld 149 (H) 70 - 99 mg/dL    Comment: Glucose reference range applies only to samples taken after fasting for at least 8 hours.   BUN 18 8 - 23 mg/dL   Creatinine, Ser 0.93 0.44 - 1.00 mg/dL   Calcium 8.5 (L) 8.9 - 10.3 mg/dL   Total Protein 5.7 (L) 6.5 - 8.1 g/dL   Albumin 3.5 3.5 - 5.0 g/dL   AST 70 (H) 15  - 41 U/L   ALT 27 0 - 44 U/L   Alkaline Phosphatase 72 38 - 126  U/L   Total Bilirubin 1.6 (H) 0.3 - 1.2 mg/dL   GFR calc non Af Amer 56 (L) >60 mL/min   GFR calc Af Amer >60 >60 mL/min   Anion gap 9 5 - 15    Comment: Performed at [facility], [place], [place] [address]  Troponin I (High Sensitivity)     Status: Abnormal   Collection Time: 12/15/19 11:53 AM  Result Value Ref Range   Troponin I (High Sensitivity) 68 (H) <18 ng/L    Comment: (NOTE) Elevated high sensitivity troponin I (hsTnI) values and significant  changes across serial measurements may suggest ACS but many other  chronic and acute conditions are known to elevate hsTnI results.  Refer to the "Links" section for chest pain algorithms and additional  guidance. Performed at [facility], [address] [place], [place] [address]   Urinalysis, Complete w Microscopic     Status: Abnormal   Collection Time: 12/15/19 11:53 AM  Result Value Ref Range   Color, Urine AMBER (A) YELLOW    Comment: BIOCHEMICALS MAY BE AFFECTED BY COLOR   APPearance TURBID (A) CLEAR   Specific Gravity, Urine 1.026 1.005 - 1.030   pH 5.0 5.0 - 8.0   Glucose, UA NEGATIVE NEGATIVE mg/dL   Hgb urine dipstick SMALL (A) NEGATIVE   Bilirubin Urine NEGATIVE NEGATIVE   Ketones, ur NEGATIVE NEGATIVE mg/dL   Protein, ur >=300 (A) NEGATIVE mg/dL   Nitrite NEGATIVE NEGATIVE   Leukocytes,Ua NEGATIVE NEGATIVE   RBC / HPF 0-5 0 - 5 RBC/hpf   WBC, UA 0-5 0 - 5 WBC/hpf   Bacteria, UA FEW (A) NONE SEEN   Squamous Epithelial / LPF 0-5 0 - 5   Mucus PRESENT    Hyaline Casts, UA PRESENT    Ca Oxalate Crys, UA PRESENT     Comment: Performed at [facility], [address], [place], [place] [address]  Urine culture     Status: None   Collection Time: 12/15/19 11:53 AM   Specimen: Urine, Random  Result Value Ref Range   Specimen Description      URINE, RANDOM Performed at [facility], [address] [place] [address]    Special Requests      NONE Performed at [facility], [address], [place], [place] [address]    Culture      NO GROWTH Performed at [facility], [address], [place], [place] [address]    Report Status 12/16/2019 FINAL   Brain natriuretic peptide     Status: Abnormal   Collection Time: 12/15/19 11:53 AM  Result Value Ref Range   B Natriuretic Peptide 781.6 (H) 0.0 - 100.0 pg/mL    Comment: Performed at [facility], [address] [place], [place] [address]  Procalcitonin - Baseline     Status: None   Collection Time: 12/15/19 11:53 AM  Result Value Ref Range   Procalcitonin <0.10 ng/mL    Comment:        Interpretation: PCT (Procalcitonin) <= 0.5 ng/mL: Systemic infection (sepsis) is not likely. Local bacterial infection is possible. (NOTE)       Sepsis PCT Algorithm           Lower Respiratory Tract  Infection PCT Algorithm    ----------------------------     ----------------------------         PCT < 0.25 ng/mL                PCT < 0.10 ng/mL          Strongly encourage             Strongly discourage   discontinuation of antibiotics    initiation of antibiotics    ----------------------------     -----------------------------       PCT 0.25 - 0.50 ng/mL            PCT 0.10 - 0.25 ng/mL               OR       >80% decrease in PCT            Discourage initiation of                                            antibiotics      Encourage discontinuation           of antibiotics    ----------------------------     -----------------------------         PCT >= 0.50 ng/mL              PCT 0.26 - 0.50 ng/mL               AND        <80% decrease in PCT             Encourage initiation of                                             antibiotics       Encourage continuation           of antibiotics    ----------------------------     -----------------------------        PCT  >= 0.50 ng/mL                  PCT > 0.50 ng/mL               AND         increase in PCT                  Strongly encourage                                      initiation of antibiotics    Strongly encourage escalation           of antibiotics                                     -----------------------------                                           PCT <= 0.25 ng/mL  OR                                        > 80% decrease in PCT                                      Discontinue / Do not initiate                                             antibiotics  Performed at [facility], [address] [place], [place] [address]   Lactic acid, plasma     Status: None   Collection Time: 12/15/19 12:57 PM  Result Value Ref Range   Lactic Acid, Venous 1.7 0.5 - 1.9 mmol/L    Comment: Performed at [facility], [address] [place], [place] [address]  SARS Coronavirus 2 by RT PCR (hospital order, performed in [facility] hospital lab) Nasopharyngeal Nasopharyngeal Swab     Status: None   Collection Time: 12/15/19  2:38 PM   Specimen: Nasopharyngeal Swab  Result Value Ref Range   SARS Coronavirus 2 NEGATIVE NEGATIVE    Comment: (NOTE) SARS-CoV-2 target nucleic acids are NOT DETECTED.  The SARS-CoV-2 RNA is generally detectable in upper and lower respiratory specimens during the acute phase of infection. The lowest concentration of SARS-CoV-2 viral copies this assay can detect is 250 copies / mL. A negative result does not preclude SARS-CoV-2 infection and should not be used as the sole basis for treatment or other patient management decisions.  A negative result may occur with improper specimen collection / handling, submission of specimen other than nasopharyngeal swab, presence of viral mutation(s) within the areas targeted by this assay, and inadequate number of viral copies (<250 copies / mL). A negative result  must be combined with clinical observations, patient history, and epidemiological information.  Fact Sheet for Patients:   StrictlyIdeas.no  Fact Sheet for Healthcare Providers: BankingDealers.co.za  This test is not yet approved or  cleared by the [place] FDA and has been authorized for detection and/or diagnosis of SARS-CoV-2 by FDA under an Emergency Use Authorization (EUA).  This EUA will remain in effect (meaning this test can be used) for the duration of the COVID-19 declaration under Section 564(b)(1) of the Act, 21 U.S.C. section 360bbb-3(b)(1), unless the authorization is terminated or revoked sooner.  Performed at [facility], [address] [place], [place] [address]   Blood culture (routine x 2)     Status: None (Preliminary result)   Collection Time: 12/15/19  2:38 PM   Specimen: BLOOD  Result Value Ref Range   Specimen Description BLOOD LEFT ANTECUBITAL    Special Requests      BOTTLES DRAWN AEROBIC AND ANAEROBIC Blood Culture adequate volume   Culture      NO GROWTH < 24 HOURS Performed at [facility], [address] [place],  [address]    Report Status PENDING   Blood culture (routine x 2)     Status: None (Preliminary result)   Collection Time: 12/15/19  2:38 PM   Specimen: BLOOD  Result Value Ref Range   Specimen Description BLOOD BLOOD RIGHT ARM  Special Requests      BOTTLES DRAWN AEROBIC AND ANAEROBIC Blood Culture adequate volume   Culture      NO GROWTH < 24 HOURS Performed at [facility], [address] [place], [place] [address]    Report Status PENDING   Troponin I (High Sensitivity)     Status: Abnormal   Collection Time: 12/15/19  3:18 PM  Result Value Ref Range   Troponin I (High Sensitivity) 66 (H) <18 ng/L    Comment: (NOTE) Elevated high sensitivity troponin I (hsTnI) values and significant  changes across serial measurements may suggest  ACS but many other  chronic and acute conditions are known to elevate hsTnI results.  Refer to the "Links" section for chest pain algorithms and additional  guidance. Performed at [facility], [place], [place] [address]   Troponin I (High Sensitivity)     Status: Abnormal   Collection Time: 12/15/19  5:45 PM  Result Value Ref Range   Troponin I (High Sensitivity) 56 (H) <18 ng/L    Comment: (NOTE) Elevated high sensitivity troponin I (hsTnI) values and significant  changes across serial measurements may suggest ACS but many other  chronic and acute conditions are known to elevate hsTnI results.  Refer to the "Links" section for chest pain algorithms and additional  guidance. Performed at [facility], [address] [place], [place] [address]   Basic metabolic panel     Status: Abnormal   Collection Time: 12/16/19  5:02 AM  Result Value Ref Range   Sodium 129 (L) 135 - 145 mmol/L   Potassium 4.4 3.5 - 5.1 mmol/L   Chloride 93 (L) 98 - 111 mmol/L   CO2 24 22 - 32 mmol/L   Glucose, Bld 113 (H) 70 - 99 mg/dL    Comment: Glucose reference range applies only to samples taken after fasting for at least 8 hours.   BUN 16 8 - 23 mg/dL   Creatinine, Ser 0.85 0.44 - 1.00 mg/dL   Calcium 8.0 (L) 8.9 - 10.3 mg/dL   GFR calc non Af Amer >60 >60 mL/min   GFR calc Af Amer >60 >60 mL/min   Anion gap 12 5 - 15    Comment: Performed at [facility], [address] [place], [place] [address]  CBC     Status: Abnormal   Collection Time: 12/16/19  5:02 AM  Result Value Ref Range   WBC 3.1 (L) 4.0 - 10.5 K/uL   RBC 5.47 (H) 3.87 - 5.11 MIL/uL   Hemoglobin 16.4 (H) 12.0 - 15.0 g/dL   HCT 47.8 (H) 36 - 46 %   MCV 87.4 80.0 - 100.0 fL   MCH 30.0 26.0 - 34.0 pg   MCHC 34.3 30.0 - 36.0 g/dL   RDW 13.6 11.5 - 15.5 %   Platelets 92 (L) 150 - 400 K/uL    Comment: CONSISTENT WITH PREVIOUS RESULT Immature Platelet Fraction may be clinically  indicated, consider ordering this additional test [id number]    nRBC 0.0 0.0 - 0.2 %    Comment: Performed at [facility], [address] [place], [place] [address]  Magnesium     Status: None   Collection Time: 12/16/19  5:02 AM  Result Value Ref Range   Magnesium 1.8 1.7 - 2.4 mg/dL    Comment: Performed at [facility], [address] [place], [place] [address]   CT Head Wo Contrast  Result Date: 12/15/2019 CLINICAL DATA:  Altered mental status.  Recent fall at home. EXAM: CT HEAD WITHOUT CONTRAST TECHNIQUE: Contiguous axial images were obtained from the base of the skull through the vertex without intravenous contrast. COMPARISON:  12/04/2019 FINDINGS: Brain: Generalized atrophy. No focal finding affects the brainstem or cerebellum. Cerebral hemispheres show chronic small-vessel ischemic changes throughout the white matter. No cortical or large vessel territory infarction. No mass lesion, hemorrhage, hydrocephalus or extra-axial collection. Vascular: There is atherosclerotic calcification of the major vessels at the base of the brain. Skull: Negative Sinuses/Orbits: Clear/normal Other: None IMPRESSION: No acute or reversible finding. No intracranial hemorrhage. Age related atrophy. Chronic small-vessel ischemic changes of the cerebral hemispheric white matter. Electronically Signed   By: Nelson Chimes M.D.   On: 12/15/2019 13:28   CT Chest W Contrast  Result Date: 12/15/2019 CLINICAL DATA:  Persistent cough. EXAM: CT CHEST WITH CONTRAST TECHNIQUE: Multidetector CT imaging of the chest was performed during intravenous contrast administration. CONTRAST:  61mL OMNIPAQUE IOHEXOL 300 MG/ML  SOLN COMPARISON:  September 18, 2017. FINDINGS: Cardiovascular: Atherosclerosis of thoracic aorta is noted without aneurysm or dissection. Large pericardial effusion is again noted as described on prior exam. Mediastinum/Nodes: 3.0 cm right thyroid nodule is noted which contains multiple  calcifications and is unchanged compared to prior exam. The esophagus is unremarkable. No definite adenopathy is noted. Lungs/Pleura: Moderate and probably loculated left pleural effusion is again noted. Small right pleural effusion is also noted which does not appear to be significantly changed. No pneumothorax is noted. Multiple rounded ill-defined opacities are noted throughout both lungs, most prominently seen in both lower lobes. These most likely represent multifocal pneumonia, potentially of viral etiology, but metastatic disease cannot be excluded. Upper Abdomen: There is interval development of several rounded low densities within the liver. Musculoskeletal: No chest wall abnormality. No acute or significant osseous findings. IMPRESSION: 1. Multiple rounded ill-defined opacities are noted throughout both lungs, but most prominently seen in the lung bases. These most likely represent multifocal pneumonia, potentially of viral etiology, but metastatic disease cannot be excluded. Further evaluation with unenhanced chest CT in 2-3 weeks is recommended to ensure resolution or stability. 2. Large pericardial effusion is again noted as described on prior exam. 3. Moderate and probably loculated left pleural effusion is noted which does not appear to be significantly changed. 4. Small right pleural effusion is also noted which does not appear to be significantly changed. 5. Interval development of several rounded low densities within the liver concerning for metastatic disease. MRI of the liver with and without gadolinium administration is recommended for further evaluation. 6. Stable 3.0 cm right thyroid nodule is noted which contains multiple calcifications and is unchanged compared to prior exam. In the setting of significant comorbidities or limited life expectancy, no follow-up recommended. (Ref: J Am Coll Radiol. 2015 Feb;12(2): 143-50). Aortic Atherosclerosis (ICD10-I70.0). Electronically Signed   By: Marijo Conception M.D.   On: 12/15/2019 13:30   CT ABDOMEN PELVIS W CONTRAST  Result Date: 12/16/2019 CLINICAL DATA:  Fair to thrive, multiple liver lesions seen on chest CT, concern for metastatic disease EXAM: CT ABDOMEN AND PELVIS WITH CONTRAST TECHNIQUE: Multidetector CT imaging of the abdomen and pelvis was performed using the standard protocol following bolus administration of intravenous contrast. CONTRAST:  37mL OMNIPAQUE IOHEXOL 300 MG/ML  SOLN COMPARISON:  12/15/2019 FINDINGS: Lower chest: Large pericardial effusion again noted unchanged. Bilateral pleural effusions are stable. Nodular consolidation at the lung bases consistent with suspected metastatic disease. Hepatobiliary: There are numerous hepatic masses,  largest within segment 4 measuring 1.8 cm. Again, dedicated liver MRI recommended when clinically able for complete characterization. On this single phase examination, metastatic disease remains the diagnosis of exclusion. No intrahepatic duct dilation. Gallbladder is surgically absent. Pancreas: Unremarkable. No pancreatic ductal dilatation or surrounding inflammatory changes. Spleen: 8 mm hyperdensity within the posterior aspect of the spleen nonspecific, and may reflect small hemangioma. Otherwise the spleen is unremarkable. Adrenals/Urinary Tract: Bilateral adrenal thickening is nonspecific, metastatic disease not excluded given remaining findings. No change since prior study. There is bilateral renal cortical atrophy. No focal renal abnormalities. No urinary tract calculi or obstruction. Bladder is decompressed but otherwise unremarkable. Stomach/Bowel: No bowel obstruction or ileus. There is diffuse diverticulosis of the sigmoid colon without evidence of diverticulitis. No bowel wall thickening or inflammatory change. Vascular/Lymphatic: Aortic atherosclerosis. No enlarged abdominal or pelvic lymph nodes. Reproductive: Status post hysterectomy. No adnexal masses. Other: Small amount of free fluid  within the abdomen and pelvis. No free intraperitoneal gas. No abdominal wall hernia. Musculoskeletal: No acute or destructive bony lesions. Reconstructed images demonstrate no additional findings. IMPRESSION: 1. Numerous hepatic masses, largest within segment 4 measuring 1.8 cm. Again, dedicated liver MRI recommended when clinically able for complete characterization. On this single phase examination, metastatic disease remains the diagnosis of exclusion. 2. Nodular consolidation at the lung bases consistent with metastatic disease. 3. Large pericardial effusion and bilateral pleural effusions, stable. 4. Small amount of free fluid within the abdomen and pelvis. 5. Sigmoid diverticulosis without diverticulitis. 6. Aortic Atherosclerosis (ICD10-I70.0). Electronically Signed   By: Randa Ngo M.D.   On: 12/16/2019 19:47   DG Chest Port 1 View  Result Date: 12/15/2019 CLINICAL DATA:  Altered mental status. Weakness. Recent fall. Pericardial effusion. EXAM: PORTABLE CHEST 1 VIEW COMPARISON:  07/08/2018, and chest CT on 09/18/2017 FINDINGS: Marked cardiac enlargement again seen, consistent with large pericardial effusion seen on prior CT. Aortic atherosclerosis noted. Small left pleural effusion or pleural thickening remains stable. Several ill-defined nodular opacities are seen in the left lung which are new since previous study. Pulmonary metastases cannot be excluded. Right lung is clear. IMPRESSION: 1. New ill-defined nodular opacities in left lung. Pulmonary metastases cannot be excluded. Consider further evaluation with chest CT with contrast. 2. Stable marked cardiac enlargement, consistent with large pericardial effusion. 3. Stable small left pleural effusion versus pleural thickening. Electronically Signed   By: Marlaine Hind M.D.   On: 12/15/2019 12:11    Assessment:  The patient is a 86 y.o. woman with a history of CHF (EF 20-25%), atrial fibrillation, chronic pericardial effusion and recurrent  pleural effusion.  Pleural fluid cytology was negative in 07/2019.  She is admitted with altered mental status and a decline in performance status over the past month.  She has acutely lose 12 pounds (80 pounds over the past 2 years).  Head CT without contrast on 12/15/2019 revealed atrophy and chronic vessel ischemic changes.  Chest CT with contrast on 12/15/2019 revealed multiple rounded ill-defined opacities throughout both lungs, but most prominently seen in the lung bases. Etiology is likely multifocal pneumonia, potentially of viral etiology, but metastatic disease cannot be excluded. There was a large pericardial effusion and moderate,  probably loculated left pleural effusion which appeared stable.  There was a small stable right pleural effusion.  There were several new rounded low densities within the liver concerning for metastatic disease. There was a stable 3.0 cm right thyroid nodule with multiple calcifications.  Abdomen and pelvis CT on 12/16/2019 revealed numerous  hepatic masses (largest 1.8 cm).  Dedicated liver MRI was recommended for complete characterization. Metastatic disease remained the diagnosis of exclusion.  There was nodular consolidation at the lung bases c/w metastatic disease.  There was a large stable pericardial effusion and bilateral pleural effusions.  There was a small amount of free fluid within the abdomen and pelvis.  Symptomatically, she denies any complaint.  She is aware of the reason for her admission and current imaging studies.  She defers direction to her family.  Plan:   1.   Liver and lung lesions  Discussed with family available imaging at time of meeting (chest CT result only).  Discussed concern for metastatic disease.  If lesions represent malignancy, she would have stage IV disease and treatment would be palliative (not curative).  Performance status is poor.  Family would like to know/confirm her diagnosis.  We discussed possible needle  biopsy.  I will review imaging with interventional radiology tomorrow. 2.   Leukopenia and thrombocytopenia  New since last admission in 07/2018.  Diet has been poor.   Only new medications prior to admission included antibiotics (amoxicillin, ciprofloxacin, azithromycin) for UTI and cough.  Patient denied any herbal products.  Labs ordered:  PT, PTT, fibrinogen, B12, folate, TSH.  Peripheral smear for review.  Monitor platelet count closely on Lovenox.   3.    Code status  Patient is DNR.    Per her medical power of attorney, she is not interested in feeding tubes.  Family is considering supportive care once diagnosis available; no treatment if cancer is discovered.  Thank you for allowing me to participate in TORINA EY 's care.  I will follow her closely with you while hospitalized and after discharge in the outpatient department.   Lequita Asal, MD  12/16/2019, 9:21 PM

## 2019-12-16 NOTE — Progress Notes (Signed)
Initial Nutrition Assessment  DOCUMENTATION CODES:   Severe malnutrition in context of chronic illness  INTERVENTION:  Continue Ensure Enlive po BID, each supplement provides 350 kcal and 20 grams of protein.  Provide Magic cup TID with meals, each supplement provides 290 kcal and 9 grams of protein.  NUTRITION DIAGNOSIS:   Severe Malnutrition related to chronic illness (CHF) as evidenced by severe fat depletion, severe muscle depletion.  GOAL:   Patient will meet greater than or equal to 90% of their needs  MONITOR:   PO intake, Supplement acceptance, Labs, Weight trends, I & O's  REASON FOR ASSESSMENT:   Malnutrition Screening Tool    ASSESSMENT:   86 year old female with PMHx of A-fib, HTN, systolic CHF, pleural effusions and pericardial effusion admitted with AMS, FTT, nodular opacities in lungs and liver concerning for metastatic disease, hyponatremia.   Met with patient and her son at bedside. Patient reports her appetite has been decreased for at least one month now. She lives with her daughter who does the grocery shopping and prepares meals. Patient reports she only eats one meal per day now at home. She may have meat with vegetables. She also has protein bars and protein shakes that to eat/drink at home. Patient only had bites of her eggs from breakfast tray this morning. She is amenable to drinking Ensure and eating Magic Cup to help meet calorie/protein needs.   Patient reports her UBW was 210 lbs several years ago. She has been slowly losing weight over time but is unsure of her current weight. According to chart patient was 87.1 kg on 04/14/2015, 62.5 kg on 07/11/2018, and was documented to be 56.7 kg (125 lbs) on 6/14. RD obtained bed scale weight today of 63.8 kg, so unsure which is more accurate.  Medications reviewed and include: Ensure Enlive BID.  Labs reviewed: Sodium 129, Chloride 93.  NUTRITION - FOCUSED PHYSICAL EXAM:    Most Recent Value  Orbital  Region Severe depletion  Upper Arm Region Severe depletion  Thoracic and Lumbar Region Moderate depletion  Buccal Region Severe depletion  Temple Region Severe depletion  Clavicle Bone Region Severe depletion  Clavicle and Acromion Bone Region Severe depletion  Scapular Bone Region Unable to assess  Dorsal Hand Severe depletion  Patellar Region Severe depletion  Anterior Thigh Region Severe depletion  Posterior Calf Region Severe depletion  Edema (RD Assessment) None  Hair Reviewed  Eyes Reviewed  Mouth Reviewed  Skin Reviewed  [ecchymosis]  Nails Reviewed     Diet Order:   Diet Order            Diet regular Room service appropriate? Yes; Fluid consistency: Thin  Diet effective now                EDUCATION NEEDS:   No education needs have been identified at this time  Skin:  Skin Assessment: Reviewed RN Assessment  Last BM:  Unknown/PTA  Height:   Ht Readings from Last 1 Encounters:  12/15/19 _0  (1.676 m)   Weight:   Wt Readings from Last 1 Encounters:  12/15/19 56.7 kg   Ideal Body Weight:  59.1 kg  BMI:  Body mass index is 20.18 kg/m.  Estimated Nutritional Needs:   Kcal:  1700-1900  Protein:  85-95 grams  Fluid:  1.6 L/day  Jacklynn Barnacle, MS, RD, LDN Pager number available on Amion

## 2019-12-16 NOTE — Evaluation (Signed)
Physical Therapy Evaluation Patient Details Name: Autumn Bradley MRN: [id number] DOB: Apr 10, 1932 Today's Date: 12/16/2019   History of Present Illness  Pt is an 86 y/o F with PMH: sCHF, AFib, HTN, and HLD who presented with altered mental status, general weakness x1 month, weight loss, and recent UTI last week and has completed a round abx w/ little to no improvement per family.  W/u included CT of chest with multiple nodular opacities bilaterally in lung and liver, concern for metastatic disease with unknown primary. Of note: son also reports recent fall OOB (1WA) and pt presents with bruise to L elbow and L upper face.  Clinical Impression  Patient sleeping upon arrival to room, but awakens to voice. Oriented to self only, but does follow simple commands with increased time for processing.  Significant ecchymosis to L forehead, L knee due to previous fall.  Patient generally weak and deconditioned (family endorses functional decline over previous 2-3 weeks), but no focal weakness appreciated.  Able to complete bed mobility with min/mod assist; unsupported sitting balance with close sup; sit/stand, static standing balance with RW, min/mod assist.  Poor balance in all planes (requiring hands-on assist at all times) and poor tolerance for standing/upright position (spontaneously sitting after 5-7 seconds of standing).  Will continue to assess/progress OOB/gait efforts as appropriate in subsequent sessions. Would benefit from skilled PT to address above deficits and promote optimal return to PLOF.;d recommend transition to STR upon discharge from acute hospitalization.     Follow Up Recommendations SNF    Equipment Recommendations       Recommendations for Other Services       Precautions / Restrictions Precautions Precautions: Fall Restrictions Weight Bearing Restrictions: No      Mobility  Bed Mobility Overal bed mobility: Needs Assistance Bed Mobility: Supine to Sit;Sit to Supine      Supine to sit: Min assist;Mod assist Sit to supine: Mod assist   General bed mobility comments: assist for truncal elevation  Transfers Overall transfer level: Needs assistance Equipment used: Rolling walker (2 wheeled) Transfers: Sit to/from Stand Sit to Stand: Min assist;Mod assist Stand pivot transfers: Min assist;Mod assist       General transfer comment: assist for lift off, standing balance; decreased WBing LLE.  Spontaneous sitting after 5-7 seconds  Ambulation/Gait             General Gait Details: unsafe/unable  Financial trader Rankin (Stroke Patients Only)       Balance Overall balance assessment: Needs assistance Sitting-balance support: No upper extremity supported;Feet supported Sitting balance-Leahy Scale: Good Sitting balance - Comments: static   Standing balance support: Bilateral upper extremity supported Standing balance-Leahy Scale: Poor Standing balance comment: mod assist for postural extension and balance in all planes                             Pertinent Vitals/Pain Pain Assessment: Faces Faces Pain Scale: Hurts little more Pain Location: L knee Pain Descriptors / Indicators: Aching;Grimacing;Guarding Pain Intervention(s): Limited activity within patient's tolerance;Monitored during session;Repositioned    Home Living Family/patient expects to be discharged to:: Private residence Living Arrangements: Children Available Help at Discharge: Family;Available 24 hours/day Type of Home: House Home Access: Stairs to enter Entrance Stairs-Rails: None Entrance Stairs-Number of Steps: 3 Home Layout: One level Home Equipment: Walker - 2 wheels;Walker - 4 wheels;Bedside commode;Transport chair;Shower seat  Prior Function Level of Independence: Needs assistance   Gait / Transfers Assistance Needed: Pt's family reports pt was transferring and walking with 2WW w/o assist >1WA  ADL's /  Homemaking Assistance Needed: pt questionable historian. Family reports pt was clear ~2WA-managing her own medication, able to perform all BADLs, and only requiring family assist for IADLs including cooking/cleaning/getting groceries-primarily assisted by daughter.        Hand Dominance   Dominant Hand: Left    Extremity/Trunk Assessment   Upper Extremity Assessment Upper Extremity Assessment: Generalized weakness (grossly at least 4-/5 throughout)    Lower Extremity Assessment Lower Extremity Assessment: Generalized weakness (grossly at least 4-/5 throughout)       Communication   Communication: No difficulties  Cognition Arousal/Alertness: Awake/alert Behavior During Therapy: WFL for tasks assessed/performed Overall Cognitive Status: Impaired/Different from baseline Area of Impairment: Orientation;Memory;Following commands;Safety/judgement;Awareness;Problem solving                 Orientation Level: Disoriented to;Place;Time   Memory: Decreased short-term memory Following Commands: Follows one step commands inconsistently;Follows one step commands with increased time Safety/Judgement: Decreased awareness of safety;Decreased awareness of deficits Awareness: Emergent Problem Solving: Slow processing;Difficulty sequencing;Requires verbal cues;Requires tactile cues General Comments: Per family, baseline mental status alert and oriented      General Comments      Exercises Other Exercises Other Exercises: Reviewed role of PT, progressive mobility in acute setting; educated in positioning, use of call bell.  Daughter in law voiced understanding, patient to benefit from repetition.   Assessment/Plan    PT Assessment Patient needs continued PT services  PT Problem List Decreased strength;Decreased activity tolerance;Decreased balance;Decreased mobility;Decreased coordination;Decreased cognition;Decreased knowledge of use of DME;Decreased safety awareness;Decreased  knowledge of precautions       PT Treatment Interventions DME instruction;Gait training;Stair training;Functional mobility training;Therapeutic activities;Therapeutic exercise;Balance training;Neuromuscular re-education;Cognitive remediation;Patient/family education    PT Goals (Current goals can be found in the Care Plan section)  Acute Rehab PT Goals Patient Stated Goal: to get her moving better and more like she was before PT Goal Formulation: With patient/family Time For Goal Achievement: 12/30/19 Potential to Achieve Goals: Fair    Frequency Min 2X/week   Barriers to discharge Decreased caregiver support      Co-evaluation               AM-PAC PT "6 Clicks" Mobility  Outcome Measure Help needed turning from your back to your side while in a flat bed without using bedrails?: A Little Help needed moving from lying on your back to sitting on the side of a flat bed without using bedrails?: A Lot Help needed moving to and from a bed to a chair (including a wheelchair)?: A Lot Help needed standing up from a chair using your arms (e.g., wheelchair or bedside chair)?: A Lot Help needed to walk in hospital room?: Total Help needed climbing 3-5 steps with a railing? : Total 6 Click Score: 11    End of Session Equipment Utilized During Treatment: Gait belt Activity Tolerance:  (onset of nausea end of session; resolved with return to supine and rest) Patient left: in bed;with call bell/phone within reach;with bed alarm set;with family/visitor present Nurse Communication: Mobility status PT Visit Diagnosis: Muscle weakness (generalized) (M62.81);Difficulty in walking, not elsewhere classified (R26.2)    Time: 3154-0086 PT Time Calculation (min) (ACUTE ONLY): 30 min   Charges:   PT Evaluation $PT Eval Moderate Complexity: 1 Mod PT Treatments $Therapeutic Activity: 8-22 mins  Hayven Fatima H. Owens Shark, PT, DPT, NCS 12/16/19, 4:50 PM [phone]

## 2019-12-16 NOTE — Evaluation (Signed)
Occupational Therapy Evaluation Patient Details Name: Autumn Bradley MRN: [id number] DOB: 1932/01/13 Today's Date: 12/16/2019    History of Present Illness Pt is an 86 y/o F with PMH: sCHF, AFib, HTN, and HLD who presented with altered mental status, general weakness x1 month, weight loss, and recent UTI last week and has completed a round abx w/ little to no improvement per family.  W/u included CT of chest with multiple nodular opacities bilaterally in lung and liver, concern for metastatic disease with unknown primary. Of note: son also reports recent fall OOB (1WA) and pt presents with bruise to L elbow and L upper face.   Clinical Impression   Pt was seen for OT evaluation this date. Prior to hospital admission, pt was requiring increased assist ~1-2weeks, but prior was able to perform BADLs, was MOD I with 2WW for fxl mobility, and only required family assist for IADLs including cooking/cleaning (primarily her daughter). Pt lives with daughter in [facility] with 3 STE with no railing. Currently pt demonstrates impairments as described below (See OT problem list) which functionally limit her ability to perform ADL/self-care tasks. Pt currently requires MOD A with ADL transfers with RW, setup to MIN A with seated UB self care, and MOD to MAX A with seated LB self care.  Pt would benefit from skilled OT to address noted impairments and functional limitations (see below for any additional details) in order to maximize safety and independence while minimizing falls risk and caregiver burden. Upon hospital discharge, recommend STR to maximize pt safety and return to PLOF.     Follow Up Recommendations  SNF    Equipment Recommendations  Other (comment) (defer to next level of care)    Recommendations for Other Services       Precautions / Restrictions Precautions Precautions: Fall Restrictions Weight Bearing Restrictions: No      Mobility Bed Mobility Overal bed mobility: Needs Assistance Bed  Mobility: Supine to Sit;Sit to Supine     Supine to sit: Min assist;Mod assist Sit to supine: Min assist      Transfers Overall transfer level: Needs assistance Equipment used: Rolling walker (2 wheeled) Transfers: Sit to/from [facility] Sit to Stand: Min assist;Mod assist Stand pivot transfers: Min assist;Mod assist       General transfer comment: MOD verbal/tactile cues for safe hand placement with use of RW, pt with poor reception of cues, demos poor safety awareness with transfers.    Balance Overall balance assessment: Needs assistance Sitting-balance support: Single extremity supported Sitting balance-Leahy Scale: Fair Sitting balance - Comments: static   Standing balance support: Bilateral upper extremity supported Standing balance-Leahy Scale: Poor Standing balance comment: requires MOD A and B UE support, demos poor understanding of hand placement cues. Not safe to test balance with fxl mobility at this time d/t poor cue following, poor tolerance, and inability to get accurate BP reading in sitting d/t moving UEs despite cues from this author.                           ADL either performed or assessed with clinical judgement   ADL Overall ADL's : Needs assistance/impaired                                       General ADL Comments: requires setup to MIN A with seated UB ADLs d/t poor sequencing,  requires MOD to MAX A with seated LB ADLs d/t poor dynamic sitting balnace, Requires MOD A for ADL transfers with RW with MOD cues for safety, and MOD+2 for toileting for standing peri care. Unable to safely assess fxl mobility d/t pt with poor safety awareness at this time in addition to low tolerance, and unable to get good BP reading d/t constant UE movement in sitting despite cues.     Vision Baseline Vision/History: Wears glasses Wears Glasses: Reading only Additional Comments: difficult to formally assess d/t cognition.      Perception     Praxis      Pertinent Vitals/Pain Pain Assessment: Faces Faces Pain Scale: Hurts little more Pain Location: diffciulty quantifying or describing location of pain, but grimmaces with  mobilization Pain Descriptors / Indicators: Grimacing Pain Intervention(s): Limited activity within patient's tolerance;Monitored during session     Hand Dominance Left   Extremity/Trunk Assessment Upper Extremity Assessment Upper Extremity Assessment: Generalized weakness   Lower Extremity Assessment Lower Extremity Assessment: Generalized weakness       Communication Communication Communication: No difficulties   Cognition Arousal/Alertness: Awake/alert Behavior During Therapy: Impulsive Overall Cognitive Status: Impaired/Different from baseline Area of Impairment: Orientation;Memory;Following commands;Safety/judgement;Awareness;Problem solving                 Orientation Level: Disoriented to;Place;Time (pt states "[facility]" and is unable to state year. For month : states "May" and then "June")   Memory: Decreased short-term memory Following Commands: Follows one step commands inconsistently;Follows one step commands with increased time Safety/Judgement: Decreased awareness of safety;Decreased awareness of deficits Awareness: Emergent Problem Solving: Slow processing;Difficulty sequencing;Requires verbal cues;Requires tactile cues     General Comments       Exercises Other Exercises Other Exercises: OT facilitates education with pt and her son who is present throughout, re: safety/fall prevention including use of call light and notifying of fall alarm. In addition, educated re: role of OT. Pt's son with good reception. Pt with poor reception detected.   Shoulder Instructions      Home Living Family/patient expects to be discharged to:: Private residence Living Arrangements: Children Available Help at Discharge: Family;Available 24 hours/day Type of  Home: House Home Access: Stairs to enter [employer] of Steps: 3 Entrance Stairs-Rails: None Home Layout: One level               Home Equipment: Walker - 2 wheels;Walker - 4 wheels;Bedside commode;Transport chair;Shower seat          Prior Functioning/Environment Level of Independence: Needs assistance  Gait / Transfers Assistance Needed: Pt's family reports pt was transferring and walking with 2WW w/o assist >1WA ADL's / Homemaking Assistance Needed: pt questionable historian. Family reports pt was clear ~2WA-managing her own medication, able to perform all BADLs, and only requiring family assist for IADLs including cooking/cleaning/getting groceries-primarily assisted by daughter.            OT Problem List: Decreased strength;Decreased range of motion;Decreased activity tolerance;Impaired balance (sitting and/or standing);Decreased cognition;Decreased safety awareness;Decreased knowledge of use of DME or AE;Cardiopulmonary status limiting activity      OT Treatment/Interventions: Self-care/ADL training;Therapeutic exercise;Energy conservation;DME and/or AE instruction;Therapeutic activities;Cognitive remediation/compensation;Balance training;Patient/family education    OT Goals(Current goals can be found in the care plan section) Acute Rehab OT Goals Patient Stated Goal: to get her moving better and more like she was before OT Goal Formulation: With family Time For Goal Achievement: 12/30/19 Potential to Achieve Goals: Good  OT Frequency: Min 2X/week   Barriers  to D/C:            Co-evaluation              AM-PAC OT "6 Clicks" Daily Activity     Outcome Measure Help from another person eating meals?: A Little Help from another person taking care of personal grooming?: A Little Help from another person toileting, which includes using toliet, bedpan, or urinal?: A Lot Help from another person bathing (including washing, rinsing, drying)?: A  Lot Help from another person to put on and taking off regular upper body clothing?: A Lot Help from another person to put on and taking off regular lower body clothing?: A Lot 6 Click Score: 14   End of Session Equipment Utilized During Treatment: Gait belt;Rolling walker Nurse Communication: Mobility status;Other (comment) (unable to get accurate BP in sitting)  Activity Tolerance: Patient limited by fatigue;Patient limited by pain Patient left: in bed;with call bell/phone within reach;with bed alarm set (OT setup chair, but pt asking to get back to bed after toileting.)  OT Visit Diagnosis: Unsteadiness on feet (R26.81);Muscle weakness (generalized) (M62.81);History of falling (Z91.81)                Time: 6644-0347 OT Time Calculation (min): 48 min Charges:  OT General Charges $OT Visit: 1 Visit OT Evaluation $OT Eval Moderate Complexity: 1 Mod OT Treatments $Self Care/Home Management : 8-22 mins $Therapeutic Activity: 8-22 mins  Gerrianne Scale, MS, OTR/L ascom [phone] 12/16/19, 3:11 PM

## 2019-12-16 NOTE — Consult Note (Signed)
Consultation Note Date: 12/16/2019   Patient Name: Autumn Bradley  DOB: Nov 12, 1931  MRN: [id number]  Age / Sex: 86 y.o., female  PCP: Adin Hector, MD Referring Physician: Lorella Nimrod, MD  Reason for Consultation: Establishing goals of care and Psychosocial/spiritual support  HPI/Patient Profile: 86 y.o. female   admitted on 12/15/2019 with a past medical history significant of systolic CHF, Afib on Eliquis, pleural effusions and pericardial effusion who admitted through the emergency with complaints of overall failure to thrive and cognitive changes per family..   Today family reports that the patient has had continued physical and functional and cognitive decline over the past month.  Patient has lost approximately 70 pounds in the last 6 months, "she just does not have an appetite".  She has increased weakness and fatigue and has a history of a fall a week ago, significant bruising to the left side of her face noted.   ED Course: initial vitals: afebrile, pulse 76, BP 104/67, sating 92% on room air.  Labs notable for Na 127, WBC 3.7, procal <0.1, BNP 781, trop 60's flat, lactic acid 1.7.  UA neg for infection.  CT head no acute finding.  CT chest showed "Multiple rounded ill-defined opacities are noted throughout both lungs", Large pericardial effusion (known prior), "Moderate and probably loculated left pleural effusion", and "several rounded low densities within the liver concerning for metastatic disease."  Pt was started on ceftriaxone and azithromycin in the ED before admission.  Patient admitted for further evaluation and stabilization.. Family awaits further input from medical team   Patient and her family face treatment option decisions, advanced directive decisiosn and anticipatory care needs.   Clinical Assessment and Goals of Care:  This NP Wadie Lessen reviewed medical records, received  report from team, assessed the patient and then meet at the patient's bedside along with her H POA Izola Price to discuss diagnosis, prognosis, GOC, EOL wishes disposition and options.   Concept of Palliative Care was introduced as specialized medical care for people and their families living with serious illness.  If focuses on providing relief from the symptoms and stress of a serious illness.  The goal is to improve quality of life for both the patient and the family.   A  discussion was had today regarding advanced directives.  Concepts specific to code status, artifical feeding and hydration, continued IV antibiotics and rehospitalization was had.  The difference between a aggressive medical intervention path  and a palliative comfort care path for this patient at this time was had.  Values and goals of care important to patient and family were attempted to be elicited.  Created space and opportunity for patient and her family at bedside to explore their thoughts and feelings regarding current medical situation.  Patient verbalizes an understanding of her current medical situation and the fact that she would not want aggressive interventions to prolong life.  "Oh for goodness sake's I am 86 years old".    Family at bedside offer support for whatever  decisions patient makes for herself.  Emotional support offered   Family present have a healthcare power of attorney and living will document.  I scanned that document and put a hard copy in her chart.    Questions and concerns addressed.  Patient  encouraged to call with questions or concerns.     PMT will continue to support holistically.    HCPOA/ [name] Light   Advanced  directive and healthcare power of attorney documents scanned and placed in hard chart    SUMMARY OF RECOMMENDATIONS    Code Status/Advance Care Planning:  DNR   No artificial feeding now or in the future  Family would like information regarding noted liver and lung  densities, however patient would likely refuse any further work-up and leans to a comfort path   They do want an oncology consult    Palliative Prophylaxis:   Aspiration, Bowel Regimen, Delirium Protocol, Frequent Pain Assessment and Oral Care  Additional Recommendations (Limitations, Scope, Preferences):  Avoid Hospitalization, No Artificial Feeding and No Surgical Procedures  Psycho-social/Spiritual:   Desire for further Chaplaincy support:yes  Additional Recommendations: Education on Hospice  Prognosis:   < 6 months--prognosis will be affected by desire for life prolonging measures.  Discharge Planning: To Be Determined      Primary Diagnoses: Present on Admission: . AMS (altered mental status)   I have reviewed the medical record, interviewed the patient and family, and examined the patient. The following aspects are pertinent.  Past Medical History:  Diagnosis Date  . Atrial fibrillation ([place])   . Cancer [facility])    SKIN CANCER  . Degenerative joint disease   . Glaucoma   . Hypercholesteremia   . Hypertension    CONTROLLED ON MEDS  . Shortness of breath dyspnea    [place]  . Wears dentures    partial upper and lower   Social History   Socioeconomic History  . Marital status: Widowed    Spouse name: Not on file  . Number of children: Not on file  . Years of education: Not on file  . Highest education level: Not on file  Occupational History  . Not on file  Tobacco Use  . Smoking status: Never Smoker  . Smokeless tobacco: Never Used  Substance and Sexual Activity  . Alcohol use: No  . Drug use: Not on file  . Sexual activity: Not on file  Other Topics Concern  . Not on file  Social History Narrative  . Not on file   Social Determinants of Health   Financial Resource Strain:   . Difficulty of Paying Living Expenses:   Food Insecurity:   . Worried About Charity fundraiser in the Last Year:   . Arboriculturist in the Last Year:     Transportation Needs:   . Film/video editor (Medical):   Marland Kitchen Lack of Transportation (Non-Medical):   Physical Activity:   . Days of Exercise per Week:   . Minutes of Exercise per Session:   Stress:   . Feeling of Stress :   Social Connections:   . Frequency of Communication with Friends and Family:   . Frequency of Social Gatherings with Friends and Family:   . Attends Religious Services:   . Active Member of Clubs or Organizations:   . Attends Archivist Meetings:   Marland Kitchen Marital Status:    Family History  Problem Relation Age of Onset  . Diabetes Mellitus II Mother   .  Heart failure Father   . COPD Sister   . Heart attack Sister   . Heart failure Sister   . Colon cancer Brother   . Kidney cancer Brother   . Lung cancer Brother    Scheduled Meds: . atorvastatin  10 mg Oral QPM  . enoxaparin (LOVENOX) injection  40 mg Subcutaneous Q24H  . feeding supplement (ENSURE ENLIVE)  237 mL Oral BID BM  . latanoprost  1 drop Both Eyes QHS  . timolol  1 drop Both Eyes Daily   Continuous Infusions: PRN Meds:. Medications Prior to Admission:  Prior to Admission medications   Medication Sig Start Date End Date Taking? Authorizing Provider  apixaban (ELIQUIS) 5 MG TABS tablet Take 5 mg by mouth in the morning and at bedtime.    Yes [provider]  atorvastatin (LIPITOR) 10 MG tablet Take 10 mg by mouth every evening.    Yes [provider]  Cholecalciferol (VITAMIN D3) 50 MCG (2000 UT) capsule Take 2,000 Units by mouth daily.    Yes [provider]  furosemide (LASIX) 20 MG tablet Take 20 mg by mouth daily as needed for fluid or edema.   Yes [provider]  latanoprost (XALATAN) 0.005 % ophthalmic solution Place 1 drop into both eyes at bedtime.    Yes [provider]  potassium chloride (K-DUR,KLOR-CON) 10 MEQ tablet Take 10 mEq by mouth daily as needed (potassium replacement with Lasix).    Yes [provider]  TIADYLT  ER 300 MG 24 hr capsule Take 300 mg by mouth at bedtime. 12/14/19  Yes [provider]  timolol (TIMOPTIC) 0.5 % ophthalmic solution Place 1 drop into both eyes daily.   Yes [provider]  vitamin B-12 (CYANOCOBALAMIN) 1000 MCG tablet Take 1,000 mcg by mouth daily.   Yes [provider]   No Known Allergies Review of Systems  Neurological: Positive for weakness.    Physical Exam Constitutional:      Appearance: She is cachectic.  Cardiovascular:     Rate and Rhythm: Tachycardia present.  Musculoskeletal:     Comments: -Generalized weakness and muscle atrophy  Skin:    General: Skin is warm and dry.  Neurological:     Mental Status: She is alert.     Vital Signs: BP 97/75 (BP Location: Right Arm)   Pulse (!) 106   Temp 97.8 F (36.6 C) (Oral)   Resp 16   Ht 5\' 6"  (1.676 m)   Wt 56.7 kg   SpO2 94%   BMI 20.18 kg/m  Pain Scale: 0-10   Pain Score: 0-No pain   SpO2: SpO2: 94 % O2 Device:SpO2: 94 % O2 Flow Rate: .   IO: Intake/output summary:   Intake/Output Summary (Last 24 hours) at 12/16/2019 8502 Last data filed at 12/16/2019 0100 Gross per 24 hour  Intake 673.83 ml  Output --  Net 673.83 ml    LBM: Last BM Date:  ("this past weekend" per pt) Baseline Weight: Weight: 56.7 kg Most recent weight: Weight: 56.7 kg     Palliative Assessment/Data: 30%   Discussed with Dr Reesa Chew  Time In: 1000 Time Out: 1115 Time Total: 75 minutes Greater than 50%  of this time was spent counseling and coordinating care related to the above assessment and plan.  Signed by: Wadie Lessen, NP   Please contact Palliative Medicine Team phone at [phone] for questions and concerns.  For individual provider: See Shea Evans

## 2019-12-16 NOTE — Plan of Care (Signed)
  Problem: Activity: Goal: Ability to tolerate increased activity will improve Outcome: Progressing   Problem: Clinical Measurements: Goal: Ability to maintain a body temperature in the normal range will improve Outcome: Progressing   Problem: Respiratory: Goal: Ability to maintain adequate ventilation will improve Outcome: Progressing Goal: Ability to maintain a clear airway will improve Outcome: Progressing   

## 2019-12-16 NOTE — Progress Notes (Signed)
PROGRESS NOTE    Autumn Bradley  ZOX:[id number] DOB: [birth year] DOA: 12/15/2019 PCP: Adin Hector, MD   Brief Narrative:  Autumn Bradley is a 86 y.o. Caucasian female with medical history significant of systolic CHF, Afib on Eliquis, pleural effusions and pericardial effusion who presented with a myriad of complaints. Basically a failure to thrive and declining health.  There was some concern of some confusion and hallucinations over the past 1 month.  CT chest with multiple nodular opacities bilaterally in lung and liver, concern for metastatic disease with unknown primary.  Subjective: Patient denies any new complaints.  We discussed the results of CT chest with concern of metastatic disease.  Daughter in law who also is her POA was present in the room.  According to her she does not want very aggressive measures for diagnosis or treatment.  She will discuss with patient's son for further plans but do request to be seen by an oncologist. I was able to communicate with patient appropriately.  She appears alert and oriented.  Per DIL she was hallucinating little while ago.  No hallucinations when I was in the room.  Assessment & Plan:   Active Problems:   AMS (altered mental status)  Altered mental status/failure to thrive/falls. Per family concern patient is becoming confused and hallucinations at times.  She was alert and oriented x3 with me.  Able to answer all the questions appropriately.  Patient has a progressive decline over the past year.  She recently lost her son due to lymphoma. -Delirium precautions. -PT/OT evaluation.  Nodular opacities in lungs and liver Weight loss --concerning for metastatic disease.  Patient and her daughter-in-law was not aware of any prior diagnosis of any type of cancer.  She does not want very aggressive measures for diagnosis or treatment.  Daughter-in-law also wants to involve oncology for a second opinion.  She was also agreeable to see a  palliative care for symptom management, as she wants patient to remain comfortable. -CT abdomen and pelvis for further evaluation. -Consult oncology-Dr. Mike Gip will see patient later today. -Consult palliative care.  Hyponatremia.  It was thought to be due to poor p.o. intake.  See if some IV fluid without any hyponatremia labs.  There is some concern for paraneoplastic syndrome.  Sodium at 129 today. -Continue IV fluid. -Continue to monitor sodium.  Chronic cough.  No leukocytosis, patient remained afebrile.  Procalcitonin negative.  Patient recently finished Z-Pak with no improvement.  There is concern for malignancy.  She was given a dose of ceftriaxone and azithromycin in the ED which was not continued. -Supportive care with cough suppressant.  Bilateral pleural effusions, chronic --L>R.  Left side probably loculated.  Had left thoracentesis back on 07/08/18.   --No current need for thoracentesis.  Large pericardial effusion, chronic, stable -Known condition in Jan 2020, appeared to be stable since then.  Afib on Eliquis -currently rate controlled.   -Holding home dose of Eliquis in case patient decided to go for biopsy.  Hx of chronic systolic CHF with LVEF 81-19% -BNP 781, but does not appear fluid overloaded.  Only on Lasix 20 mg PRN at home. PLAN: -Hold diuretic since BP low.  Glaucoma -continue home eye drops  Objective: Vitals:   12/16/19 0405 12/16/19 0754 12/16/19 1020 12/16/19 1243  BP: (!) 105/57 97/75 111/82 97/68  Pulse: 74 (!) 106 63 98  Resp: (!) '21 16 18 16  '$ Temp: 97.6 F (36.4 C) 97.8 F (36.6 C) 98.2 F (  36.8 C) 98.2 F (36.8 C)  TempSrc:  Oral    SpO2: 92% 94% 96% 100%  Weight:      Height:        Intake/Output Summary (Last 24 hours) at 12/16/2019 1417 Last data filed at 12/16/2019 1330 Gross per 24 hour  Intake 793.83 ml  Output --  Net 793.83 ml   Filed Weights   12/15/19 1137  Weight: 56.7 kg    Examination:  General exam: Frail  elderly lady with multiple ecchymosis, appears calm and comfortable  Respiratory system: Clear to auscultation. Respiratory effort normal. Cardiovascular system: S1 & S2 heard, RRR. No JVD, murmurs, rubs,  Gastrointestinal system: Soft, nontender, nondistended, bowel sounds positive. Central nervous system: Alert and oriented. No focal neurological deficits.Symmetric 5 x 5 power. Extremities: No edema, no cyanosis, pulses intact and symmetrical. Psychiatry: Judgement and insight appear normal.   DVT prophylaxis: Lovenox Code Status: DNR Family Communication: Daughter-in-law was updated at bedside. Disposition Plan:  Status is: Inpatient  Remains inpatient appropriate because:Ongoing diagnostic testing needed not appropriate for outpatient work up   Dispo: The patient is from: Home              Anticipated d/c is to: To be determined              Anticipated d/c date is: 2 days              Patient currently is not medically stable to d/c.  Patient with concern of metastatic disease.  Needs further evaluation.  Palliative care was also consulted as she might qualify for hospice care as she does not want to pursue very aggressive measures.  Consultants:   Oncology  Palliative care  Procedures:  Antimicrobials:   Data Reviewed: I have personally reviewed following labs and imaging studies  CBC: Recent Labs  Lab 12/15/19 1153 12/16/19 0502  WBC 3.7* 3.1*  HGB 16.6* 16.4*  HCT 46.8* 47.8*  MCV 85.1 87.4  PLT 94* 92*   Basic Metabolic Panel: Recent Labs  Lab 12/15/19 1153 12/16/19 0502  NA 127* 129*  K 4.7 4.4  CL 92* 93*  CO2 26 24  GLUCOSE 149* 113*  BUN 18 16  CREATININE 0.93 0.85  CALCIUM 8.5* 8.0*  MG  --  1.8   GFR: Estimated Creatinine Clearance: 42.5 mL/min (by C-G formula based on SCr of 0.85 mg/dL). Liver Function Tests: Recent Labs  Lab 12/15/19 1153  AST 70*  ALT 27  ALKPHOS 72  BILITOT 1.6*  PROT 5.7*  ALBUMIN 3.5   No results for  input(s): LIPASE, AMYLASE in the last 168 hours. No results for input(s): AMMONIA in the last 168 hours. Coagulation Profile: No results for input(s): INR, PROTIME in the last 168 hours. Cardiac Enzymes: No results for input(s): CKTOTAL, CKMB, CKMBINDEX, TROPONINI in the last 168 hours. BNP (last 3 results) No results for input(s): PROBNP in the last 8760 hours. HbA1C: No results for input(s): HGBA1C in the last 72 hours. CBG: No results for input(s): GLUCAP in the last 168 hours. Lipid Profile: No results for input(s): CHOL, HDL, LDLCALC, TRIG, CHOLHDL, LDLDIRECT in the last 72 hours. Thyroid Function Tests: No results for input(s): TSH, T4TOTAL, FREET4, T3FREE, THYROIDAB in the last 72 hours. Anemia Panel: No results for input(s): VITAMINB12, FOLATE, FERRITIN, TIBC, IRON, RETICCTPCT in the last 72 hours. Sepsis Labs: Recent Labs  Lab 12/15/19 1153 12/15/19 1257  PROCALCITON <0.10  --   LATICACIDVEN  --  1.7  Recent Results (from the past 240 hour(s))  Urine culture     Status: None   Collection Time: 12/15/19 11:53 AM   Specimen: Urine, Random  Result Value Ref Range Status   Specimen Description   Final    URINE, RANDOM Performed at [facility], [address] [place], [place] [address]    Special Requests   Final    NONE Performed at [facility], [address] [place], [place] [address]    Culture   Final    NO GROWTH Performed at [facility], [place] [address] [place], [place] [address]    Report Status 12/16/2019 FINAL  Final  SARS Coronavirus 2 by RT PCR (hospital order, performed in [place] hospital lab) Nasopharyngeal Nasopharyngeal Swab     Status: None   Collection Time: 12/15/19  2:38 PM   Specimen: Nasopharyngeal Swab  Result Value Ref Range Status   SARS Coronavirus 2 NEGATIVE NEGATIVE Final    Comment: (NOTE) SARS-CoV-2 target nucleic acids are NOT DETECTED.  The SARS-CoV-2 RNA is generally detectable in upper and  lower respiratory specimens during the acute phase of infection. The lowest concentration of SARS-CoV-2 viral copies this assay can detect is 250 copies / mL. A negative result does not preclude SARS-CoV-2 infection and should not be used as the sole basis for treatment or other patient management decisions.  A negative result may occur with improper specimen collection / handling, submission of specimen other than nasopharyngeal swab, presence of viral mutation(s) within the areas targeted by this assay, and inadequate number of viral copies (<250 copies / mL). A negative result must be combined with clinical observations, patient history, and epidemiological information.  Fact Sheet for Patients:   StrictlyIdeas.no  Fact Sheet for Healthcare Providers: BankingDealers.co.za  This test is not yet approved or  cleared by the [place] FDA and has been authorized for detection and/or diagnosis of SARS-CoV-2 by FDA under an Emergency Use Authorization (EUA).  This EUA will remain in effect (meaning this test can be used) for the duration of the COVID-19 declaration under Section 564(b)(1) of the Act, 21 U.S.C. section 360bbb-3(b)(1), unless the authorization is terminated or revoked sooner.  Performed at [facility], [address] [place], [place] [address]   Blood culture (routine x 2)     Status: None (Preliminary result)   Collection Time: 12/15/19  2:38 PM   Specimen: BLOOD  Result Value Ref Range Status   Specimen Description BLOOD LEFT ANTECUBITAL  Final   Special Requests   Final    BOTTLES DRAWN AEROBIC AND ANAEROBIC Blood Culture adequate volume   Culture   Final    NO GROWTH < 24 HOURS Performed at [facility], [address] [place], [place] [address]    Report Status PENDING  Incomplete  Blood culture (routine x 2)     Status: None (Preliminary result)   Collection Time: 12/15/19  2:38 PM    Specimen: BLOOD  Result Value Ref Range Status   Specimen Description BLOOD BLOOD RIGHT ARM  Final   Special Requests   Final    BOTTLES DRAWN AEROBIC AND ANAEROBIC Blood Culture adequate volume   Culture   Final    NO GROWTH < 24 HOURS Performed at [facility], [address] [place], [place] [address]    Report Status PENDING  Incomplete     Radiology Studies: CT Head Wo Contrast  Result Date: 12/15/2019 CLINICAL DATA:  Altered mental status.  Recent fall at home. EXAM: CT HEAD WITHOUT CONTRAST TECHNIQUE: Contiguous axial images were obtained from the base of the skull through the vertex without intravenous contrast. COMPARISON:  12/04/2019 FINDINGS: Brain: Generalized atrophy. No focal finding affects the brainstem or cerebellum. Cerebral hemispheres show chronic small-vessel ischemic changes throughout the white matter. No cortical or large vessel territory infarction. No mass lesion, hemorrhage, hydrocephalus or extra-axial collection. Vascular: There is atherosclerotic calcification of the major vessels at the base of the brain. Skull: Negative Sinuses/Orbits: Clear/normal Other: None IMPRESSION: No acute or reversible finding. No intracranial hemorrhage. Age related atrophy. Chronic small-vessel ischemic changes of the cerebral hemispheric white matter. Electronically Signed   By: Nelson Chimes M.D.   On: 12/15/2019 13:28   CT Chest W Contrast  Result Date: 12/15/2019 CLINICAL DATA:  Persistent cough. EXAM: CT CHEST WITH CONTRAST TECHNIQUE: Multidetector CT imaging of the chest was performed during intravenous contrast administration. CONTRAST:  6m OMNIPAQUE IOHEXOL 300 MG/ML  SOLN COMPARISON:  September 18, 2017. FINDINGS: Cardiovascular: Atherosclerosis of thoracic aorta is noted without aneurysm or dissection. Large pericardial effusion is again noted as described on prior exam. Mediastinum/Nodes: 3.0 cm right thyroid nodule is noted which contains multiple calcifications and is  unchanged compared to prior exam. The esophagus is unremarkable. No definite adenopathy is noted. Lungs/Pleura: Moderate and probably loculated left pleural effusion is again noted. Small right pleural effusion is also noted which does not appear to be significantly changed. No pneumothorax is noted. Multiple rounded ill-defined opacities are noted throughout both lungs, most prominently seen in both lower lobes. These most likely represent multifocal pneumonia, potentially of viral etiology, but metastatic disease cannot be excluded. Upper Abdomen: There is interval development of several rounded low densities within the liver. Musculoskeletal: No chest wall abnormality. No acute or significant osseous findings. IMPRESSION: 1. Multiple rounded ill-defined opacities are noted throughout both lungs, but most prominently seen in the lung bases. These most likely represent multifocal pneumonia, potentially of viral etiology, but metastatic disease cannot be excluded. Further evaluation with unenhanced chest CT in 2-3 weeks is recommended to ensure resolution or stability. 2. Large pericardial effusion is again noted as described on prior exam. 3. Moderate and probably loculated left pleural effusion is noted which does not appear to be significantly changed. 4. Small right pleural effusion is also noted which does not appear to be significantly changed. 5. Interval development of several rounded low densities within the liver concerning for metastatic disease. MRI of the liver with and without gadolinium administration is recommended for further evaluation. 6. Stable 3.0 cm right thyroid nodule is noted which contains multiple calcifications and is unchanged compared to prior exam. In the setting of significant comorbidities or limited life expectancy, no follow-up recommended. (Ref: J Am Coll Radiol. 2015 Feb;12(2): 143-50). Aortic Atherosclerosis (ICD10-I70.0). Electronically Signed   By: JMarijo ConceptionM.D.   On:  12/15/2019 13:30   DG Chest Port 1 View  Result Date: 12/15/2019 CLINICAL DATA:  Altered mental status. Weakness. Recent fall. Pericardial effusion. EXAM: PORTABLE CHEST 1 VIEW COMPARISON:  07/08/2018, and chest CT on 09/18/2017 FINDINGS: Marked cardiac enlargement again seen, consistent with large pericardial effusion seen on prior CT. Aortic atherosclerosis noted. Small left pleural effusion or pleural thickening remains stable. Several ill-defined nodular opacities are seen in the left lung which are new since previous study. Pulmonary metastases cannot be excluded. Right lung is clear. IMPRESSION: 1. New ill-defined nodular opacities in left lung. Pulmonary metastases cannot  be excluded. Consider further evaluation with chest CT with contrast. 2. Stable marked cardiac enlargement, consistent with large pericardial effusion. 3. Stable small left pleural effusion versus pleural thickening. Electronically Signed   By: Marlaine Hind M.D.   On: 12/15/2019 12:11    Scheduled Meds: . atorvastatin  10 mg Oral QPM  . enoxaparin (LOVENOX) injection  40 mg Subcutaneous Q24H  . feeding supplement (ENSURE ENLIVE)  237 mL Oral BID BM  . iohexol  500 mL Oral Q1H  . latanoprost  1 drop Both Eyes QHS  . timolol  1 drop Both Eyes Daily   Continuous Infusions:   LOS: 1 day   Time spent: 45 minutes.  Lorella Nimrod, MD [facility]  If 7PM-7AM, please contact night-coverage [email or web address]  12/16/2019, 2:17 PM   This record has been created using Systems analyst. Errors have been sought and corrected,but may not always be located. Such creation errors do not reflect on the standard of care.

## 2019-12-16 NOTE — TOC Initial Note (Signed)
Transition of Care [facility]) - Initial/Assessment Note    Patient Details  Name: Autumn Bradley MRN: [id number] Date of Birth: 1931-11-24  Transition of Care [facility]) CM/SW Contact:    Elease Hashimoto, LCSW Phone Number: 12/16/2019, 10:34 AM  Clinical Narrative:  Met with pt and daughter in-law-kathy at bedside. Pt lives with her daughter-Kathy who has taken a leave from work to be there with her. She has declined in the past 3-4 weeks. Son and daughter in-law are involved and assist also. Pt was ambulating with a walker and did well at home. They do not know if she rolled out of bed or fell trying to go to the bathroom. Family members provide transportation and assists with home management. Pt was independent in her self care and mobility. Will await medical work up to see if any discharge needs.                Expected Discharge Plan: Home/Self Care Barriers to Discharge: Continued Medical Work up   Patient Goals and CMS Choice Patient states their goals for this hospitalization and ongoing recovery are:: I hope to find out what is wrong with me      Expected Discharge Plan and Services Expected Discharge Plan: Home/Self Care In-house Referral: Clinical Social Work     Living arrangements for the past 2 months: Single Family Home                                      Prior Living Arrangements/Services Living arrangements for the past 2 months: Single Family Home Lives with:: Adult Children Acupuncturist) Patient language and need for interpreter reviewed:: No Do you feel safe going back to the place where you live?: Yes      Need for Family Participation in Patient Care: Yes (Comment) Care giver support system in place?: Yes (comment) Current home services: DME (rw) Criminal Activity/Legal Involvement Pertinent to Current Situation/Hospitalization: No - Comment as needed  Activities of Daily Living Home Assistive Devices/Equipment: Environmental consultant (specify type) ADL Screening  (condition at time of admission) Patient's cognitive ability adequate to safely complete daily activities?: No Is the patient deaf or have difficulty hearing?: Yes Does the patient have difficulty seeing, even when wearing glasses/contacts?: No Does the patient have difficulty concentrating, remembering, or making decisions?: Yes Patient able to express need for assistance with ADLs?: Yes Does the patient have difficulty dressing or bathing?: Yes Independently performs ADLs?: Yes (appropriate for developmental age) Does the patient have difficulty walking or climbing stairs?: Yes Weakness of Legs: Both Weakness of Arms/Hands: None  Permission Sought/Granted Permission sought to share information with : Family Supports Permission granted to share information with : Yes, Verbal Permission Granted  Share Information with NAME: kathy-daughter and daughter in-law     Permission granted to share info w Relationship: daughter and daughter in-law same name     Emotional Assessment Appearance:: Appears stated age Attitude/Demeanor/Rapport: Gracious Affect (typically observed): Adaptable, Accepting Orientation: : Oriented to Self, Oriented to Place   Psych Involvement: No (comment)  Admission diagnosis:  Encephalopathy [G93.40] Pulmonary nodules [R91.8] Community acquired pneumonia, unspecified laterality [J18.9] AMS (altered mental status) [R41.82] Patient Active Problem List   Diagnosis Date Noted   AMS (altered mental status) 12/15/2019   Recurrent left pleural effusion    Goals of care, counseling/discussion    Palliative care by specialist    CHF (congestive heart failure) ([place]) 07/07/2018  PCP:  Adin Hector, MD Pharmacy:   [id number] [place] [id number]: [phone]: [phone]    Social Determinants of Health (SDOH) Interventions    Readmission Risk Interventions No flowsheet data  found.

## 2019-12-17 LAB — CBC
HCT: 49.2 % — ABNORMAL HIGH (ref 36.0–46.0)
Hemoglobin: 17 g/dL — ABNORMAL HIGH (ref 12.0–15.0)
MCH: 30.1 pg (ref 26.0–34.0)
MCHC: 34.6 g/dL (ref 30.0–36.0)
MCV: 87.1 fL (ref 80.0–100.0)
Platelets: 89 10*3/uL — ABNORMAL LOW (ref 150–400)
RBC: 5.65 MIL/uL — ABNORMAL HIGH (ref 3.87–5.11)
RDW: 13.9 % (ref 11.5–15.5)
WBC: 4.2 10*3/uL (ref 4.0–10.5)
nRBC: 0 % (ref 0.0–0.2)

## 2019-12-17 LAB — TSH: TSH: 4.854 u[IU]/mL — ABNORMAL HIGH (ref 0.350–4.500)

## 2019-12-17 LAB — BASIC METABOLIC PANEL
Anion gap: 12 (ref 5–15)
BUN: 25 mg/dL — ABNORMAL HIGH (ref 8–23)
CO2: 23 mmol/L (ref 22–32)
Calcium: 8.3 mg/dL — ABNORMAL LOW (ref 8.9–10.3)
Chloride: 92 mmol/L — ABNORMAL LOW (ref 98–111)
Creatinine, Ser: 1.01 mg/dL — ABNORMAL HIGH (ref 0.44–1.00)
GFR calc Af Amer: 58 mL/min — ABNORMAL LOW (ref >60)
GFR calc non Af Amer: 50 mL/min — ABNORMAL LOW (ref >60)
Glucose, Bld: 111 mg/dL — ABNORMAL HIGH (ref 70–99)
Potassium: 4.9 mmol/L (ref 3.5–5.1)
Sodium: 127 mmol/L — ABNORMAL LOW (ref 135–145)

## 2019-12-17 LAB — FOLATE: Folate: 9.4 ng/mL

## 2019-12-17 LAB — PROTIME-INR
INR: 1.3 — ABNORMAL HIGH (ref 0.8–1.2)
Prothrombin Time: 15.3 seconds — ABNORMAL HIGH (ref 11.4–15.2)

## 2019-12-17 LAB — APTT: aPTT: 46 seconds — ABNORMAL HIGH (ref 24–36)

## 2019-12-17 LAB — FIBRINOGEN: Fibrinogen: 165 mg/dL — ABNORMAL LOW (ref 210–475)

## 2019-12-17 LAB — OSMOLALITY: Osmolality: 278 mOsm/kg (ref 275–295)

## 2019-12-17 LAB — FIBRIN DERIVATIVES D-DIMER (ARMC ONLY): Fibrin derivatives D-dimer (ARMC): 984 ng{FEU}/mL — ABNORMAL HIGH (ref 0.00–499.00)

## 2019-12-17 LAB — VITAMIN B12: Vitamin B-12: 324 pg/mL (ref 180–914)

## 2019-12-17 LAB — MAGNESIUM: Magnesium: 2.1 mg/dL (ref 1.7–2.4)

## 2019-12-17 LAB — PATHOLOGIST SMEAR REVIEW

## 2019-12-17 NOTE — Progress Notes (Addendum)
[facility] Hematology/Oncology Progress Note  Date of admission: 12/15/2019  Hospital day:  12/17/2019  Chief Complaint: Autumn Bradley is a 86 y.o. female with a history of CHF, pleural and pericardial effusions who was admitted through the ER with altered mental status, hyponatremia, weight loss, and nodular opacities in the lungs.  Subjective: The patient denies any complaints.  She drank an Ensure today.  Social History: The patient is accompanied by her daughter-in-law, Autumn Bradley, and son, Autumn Bradley, today.  Allergies: No Known Allergies  Scheduled Medications: . atorvastatin  10 mg Oral QPM  . diltiazem  300 mg Oral QHS  . enoxaparin (LOVENOX) injection  40 mg Subcutaneous Q24H  . feeding supplement (ENSURE ENLIVE)  237 mL Oral BID BM  . latanoprost  1 drop Both Eyes QHS  . timolol  1 drop Both Eyes Daily    Review of Systems: GENERAL:  Fatigue.  No fevers, sweats.  Weight loss of 80 pounds in 2 years (12 pounds since last admission). PERFORMANCE STATUS (ECOG):  3 HEENT:  No visual changes, runny nose, sore throat, mouth sores or tenderness. Lungs: Denies shortness of breath.  Cough productive of pink sputum at times. Cardiac:  CHF.  Atrial fibrillation.  No chest pain, palpitations, orthopnea, or PND.  States that she can lie flat. GI:  No nausea, vomiting, diarrhea, constipation, melena or hematochezia. GU:  Poor appetite.  No urgency, frequency, dysuria, or hematuria. Musculoskeletal:  General weakness.  No focal pain. Extremities:  No pain or swelling. Skin:  Bruising s/p fall 1 week prior to admission.  No rashes or skin changes. Neuro:  Thinking clearer today.  No headache, focal numbness or weakness. Endocrine:  No diabetes, thyroid issues, hot flashes or night sweats. Psych:  No mood changes, depression or anxiety. Pain:  No focal pain. Review of systems:  All other systems reviewed and found to be negative.  Physical Exam: Blood pressure 105/75,  pulse 73, temperature 97.9 F (36.6 C), temperature source Oral, resp. rate 16, height 5\' 6"  (1.676 m), weight 125 lb (56.7 kg), SpO2 94 %.  GENERAL:  Chronically fatigued appeared elderly woman sitting up comfortably in no acute distress. MENTAL STATUS:  Alert and oriented to person, place and time. HEAD:  Short white hair.  Normocephalic, atraumatic, face symmetric, no Cushingoid features. EYES:  No conjunctivitis or scleral icterus. NEUROLOGICAL: Unremarkable.  Engaging.  Thoughts clear.  Talkative. PSYCH:  Appropriate.   Results for orders placed or performed during the hospital encounter of 12/15/19 (from the past 48 hour(s))  Basic metabolic panel     Status: Abnormal   Collection Time: 12/16/19  5:02 AM  Result Value Ref Range   Sodium 129 (L) 135 - 145 mmol/L   Potassium 4.4 3.5 - 5.1 mmol/L   Chloride 93 (L) 98 - 111 mmol/L   CO2 24 22 - 32 mmol/L   Glucose, Bld 113 (H) 70 - 99 mg/dL    Comment: Glucose reference range applies only to samples taken after fasting for at least 8 hours.   BUN 16 8 - 23 mg/dL   Creatinine, Ser 0.85 0.44 - 1.00 mg/dL   Calcium 8.0 (L) 8.9 - 10.3 mg/dL   GFR calc non Af Amer >60 >60 mL/min   GFR calc Af Amer >60 >60 mL/min   Anion gap 12 5 - 15    Comment: Performed at [facility], [address] [place], [place] [address]  CBC     Status: Abnormal  Collection Time: 12/16/19  5:02 AM  Result Value Ref Range   WBC 3.1 (L) 4.0 - 10.5 K/uL   RBC 5.47 (H) 3.87 - 5.11 MIL/uL   Hemoglobin 16.4 (H) 12.0 - 15.0 g/dL   HCT 47.8 (H) 36 - 46 %   MCV 87.4 80.0 - 100.0 fL   MCH 30.0 26.0 - 34.0 pg   MCHC 34.3 30.0 - 36.0 g/dL   RDW 13.6 11.5 - 15.5 %   Platelets 92 (L) 150 - 400 K/uL    Comment: CONSISTENT WITH PREVIOUS RESULT Immature Platelet Fraction may be clinically indicated, consider ordering this additional test [id number]    nRBC 0.0 0.0 - 0.2 %    Comment: Performed at [facility], [address] [place],  [place] [address]  Magnesium     Status: None   Collection Time: 12/16/19  5:02 AM  Result Value Ref Range   Magnesium 1.8 1.7 - 2.4 mg/dL    Comment: Performed at [facility], [address] [place], [place] [address]  Basic metabolic panel     Status: Abnormal   Collection Time: 12/17/19  3:52 AM  Result Value Ref Range   Sodium 127 (L) 135 - 145 mmol/L   Potassium 4.9 3.5 - 5.1 mmol/L   Chloride 92 (L) 98 - 111 mmol/L   CO2 23 22 - 32 mmol/L   Glucose, Bld 111 (H) 70 - 99 mg/dL    Comment: Glucose reference range applies only to samples taken after fasting for at least 8 hours.   BUN 25 (H) 8 - 23 mg/dL   Creatinine, Ser 1.01 (H) 0.44 - 1.00 mg/dL   Calcium 8.3 (L) 8.9 - 10.3 mg/dL   GFR calc non Af Amer 50 (L) >60 mL/min   GFR calc Af Amer 58 (L) >60 mL/min   Anion gap 12 5 - 15    Comment: Performed at [facility], [address] [place], [place] [address]  CBC     Status: Abnormal   Collection Time: 12/17/19  3:52 AM  Result Value Ref Range   WBC 4.2 4.0 - 10.5 K/uL   RBC 5.65 (H) 3.87 - 5.11 MIL/uL   Hemoglobin 17.0 (H) 12.0 - 15.0 g/dL   HCT 49.2 (H) 36 - 46 %   MCV 87.1 80.0 - 100.0 fL   MCH 30.1 26.0 - 34.0 pg   MCHC 34.6 30.0 - 36.0 g/dL   RDW 13.9 11.5 - 15.5 %   Platelets 89 (L) 150 - 400 K/uL    Comment: CONSISTENT WITH PREVIOUS RESULT Immature Platelet Fraction may be clinically indicated, consider ordering this additional test [id number]    nRBC 0.0 0.0 - 0.2 %    Comment: Performed at [facility], [address], [place], [place] [address]  Magnesium     Status: None   Collection Time: 12/17/19  3:52 AM  Result Value Ref Range   Magnesium 2.1 1.7 - 2.4 mg/dL    Comment: Performed at [facility], [address], [place], [place] [address]  Pathologist smear review     Status: None   Collection Time: 12/17/19  3:52 AM  Result Value Ref Range   Path Review Blood smear is reviewed.     Comment: Patient with altered  mental status, weight loss, and imaging concerning for metastatic disease with no known primary. Thrombocytopenia. Unremarkable RBC, WBC, and platelet morphology. Hematology is following for further evaluation of thrombocytopenia. Reviewed by Dellia Nims  Reuel Derby, M.D. Performed at [facility], [address] [place], [place] [address]   Vitamin B12     Status: None   Collection Time: 12/17/19  3:52 AM  Result Value Ref Range   Vitamin B-12 324 180 - 914 pg/mL    Comment: (NOTE) This assay is not validated for testing neonatal or myeloproliferative syndrome specimens for Vitamin B12 levels. Performed at [facility], [place] [address], [place], [place] [address]   Folate, serum, performed at [facility] lab     Status: None   Collection Time: 12/17/19  3:52 AM  Result Value Ref Range   Folate 9.4 >5.9 ng/mL    Comment: Performed at [facility], [address] [place], [place] [address]  TSH     Status: Abnormal   Collection Time: 12/17/19  3:52 AM  Result Value Ref Range   TSH 4.854 (H) 0.350 - 4.500 uIU/mL    Comment: Performed by a 3rd Generation assay with a functional sensitivity of <=0.01 uIU/mL. Performed at [facility], [address] [place], [place] [address]   Protime-INR     Status: Abnormal   Collection Time: 12/17/19  3:52 AM  Result Value Ref Range   Prothrombin Time 15.3 (H) 11.4 - 15.2 seconds   INR 1.3 (H) 0.8 - 1.2    Comment: (NOTE) INR goal varies based on device and disease states. Performed at [facility], [address] [place], [place] [address]   APTT     Status: Abnormal   Collection Time: 12/17/19  3:52 AM  Result Value Ref Range   aPTT 46 (H) 24 - 36 seconds    Comment:        IF BASELINE aPTT IS ELEVATED, SUGGEST PATIENT RISK ASSESSMENT BE USED TO DETERMINE APPROPRIATE ANTICOAGULANT THERAPY. Performed at [facility], [address] [place], [place] [address]   Fibrinogen     Status:  Abnormal   Collection Time: 12/17/19  3:52 AM  Result Value Ref Range   Fibrinogen 165 (L) 210 - 475 mg/dL    Comment: Performed at [facility], [address] [place], [place] [address]  Fibrin derivatives D-Dimer [facility] only)     Status: Abnormal   Collection Time: 12/17/19  3:52 AM  Result Value Ref Range   Fibrin derivatives D-dimer ([facility]) 984.00 (H) 0.00 - 499.00 ng/mL (FEU)    Comment: (NOTE) <> Exclusion of Venous Thromboembolism (VTE) - OUTPATIENT ONLY   (Emergency Department or [place])    0-499 ng/ml (FEU): With a low to intermediate pretest probability                      for VTE this test result excludes the diagnosis                      of VTE.   >499 ng/ml (FEU) : VTE not excluded; additional work up for VTE is                      required.  <> Testing on Inpatients and Evaluation of Disseminated Intravascular   Coagulation (DIC) Reference Range:   0-499 ng/ml (FEU) Performed at [facility], [address] [place], [place] [address]   Osmolality     Status: None   Collection Time: 12/17/19  7:00 PM  Result Value Ref Range   Osmolality 278 275 - 295 mOsm/kg    Comment:  Performed at [facility], [address] [place], [place] [address]   CT ABDOMEN PELVIS W CONTRAST  Result Date: 12/16/2019 CLINICAL DATA:  Fair to thrive, multiple liver lesions seen on chest CT, concern for metastatic disease EXAM: CT ABDOMEN AND PELVIS WITH CONTRAST TECHNIQUE: Multidetector CT imaging of the abdomen and pelvis was performed using the standard protocol following bolus administration of intravenous contrast. CONTRAST:  10mL OMNIPAQUE IOHEXOL 300 MG/ML  SOLN COMPARISON:  12/15/2019 FINDINGS: Lower chest: Large pericardial effusion again noted unchanged. Bilateral pleural effusions are stable. Nodular consolidation at the lung bases consistent with suspected metastatic disease. Hepatobiliary: There are numerous hepatic masses, largest within segment 4  measuring 1.8 cm. Again, dedicated liver MRI recommended when clinically able for complete characterization. On this single phase examination, metastatic disease remains the diagnosis of exclusion. No intrahepatic duct dilation. Gallbladder is surgically absent. Pancreas: Unremarkable. No pancreatic ductal dilatation or surrounding inflammatory changes. Spleen: 8 mm hyperdensity within the posterior aspect of the spleen nonspecific, and may reflect small hemangioma. Otherwise the spleen is unremarkable. Adrenals/Urinary Tract: Bilateral adrenal thickening is nonspecific, metastatic disease not excluded given remaining findings. No change since prior study. There is bilateral renal cortical atrophy. No focal renal abnormalities. No urinary tract calculi or obstruction. Bladder is decompressed but otherwise unremarkable. Stomach/Bowel: No bowel obstruction or ileus. There is diffuse diverticulosis of the sigmoid colon without evidence of diverticulitis. No bowel wall thickening or inflammatory change. Vascular/Lymphatic: Aortic atherosclerosis. No enlarged abdominal or pelvic lymph nodes. Reproductive: Status post hysterectomy. No adnexal masses. Other: Small amount of free fluid within the abdomen and pelvis. No free intraperitoneal gas. No abdominal wall hernia. Musculoskeletal: No acute or destructive bony lesions. Reconstructed images demonstrate no additional findings. IMPRESSION: 1. Numerous hepatic masses, largest within segment 4 measuring 1.8 cm. Again, dedicated liver MRI recommended when clinically able for complete characterization. On this single phase examination, metastatic disease remains the diagnosis of exclusion. 2. Nodular consolidation at the lung bases consistent with metastatic disease. 3. Large pericardial effusion and bilateral pleural effusions, stable. 4. Small amount of free fluid within the abdomen and pelvis. 5. Sigmoid diverticulosis without diverticulitis. 6. Aortic Atherosclerosis  (ICD10-I70.0). Electronically Signed   By: Randa Ngo M.D.   On: 12/16/2019 19:47    Assessment:  MARGRETT KALB is a 86 y.o. female with a history of CHF (EF 20-25%), atrial fibrillation, chronic pericardial effusion and recurrent pleural effusion.  Pleural fluid cytology was negative in 07/2019.  She is admitted with altered mental status and a decline in performance status over the past month.  She has acutely lose 12 pounds (80 pounds over the past 2 years).  Head CT without contrast on 12/15/2019 revealed atrophy and chronic vessel ischemic changes.  Chest CT with contrast on 12/15/2019 revealed multiple rounded ill-defined opacities throughout both lungs, but most prominently seen in the lung bases. Etiology is likely multifocal pneumonia, potentially of viral etiology, but metastatic disease cannot be excluded. There was a large pericardial effusion and moderate,  probably loculated left pleural effusion which appeared stable.  There was a small stable right pleural effusion.  There were several new rounded low densities within the liver concerning for metastatic disease. There was a stable 3.0 cm right thyroid nodule with multiple calcifications.  Abdomen and pelvis CT on 12/16/2019 revealed numerous hepatic masses (largest 1.8 cm).  Dedicated liver MRI was recommended for complete characterization. Metastatic disease remained the diagnosis of exclusion.  There was nodular consolidation at the lung bases  c/w metastatic disease.  There was a large stable pericardial effusion and bilateral pleural effusions.  There was a small amount of free fluid within the abdomen and pelvis.  Symptomatically, she denies any complaints.  She is interested in pursuing an evaluation.  Sodium 127.  Plan:   1.   Liver and lung lesions             Reviewed chest CT and abdomen and pelvis CT personally with patient and her family at bedside.             Images worrisome for metastatic disease (stage IV  disease).             If she has stage IV disease , treatment would be palliative (not curative).             Performance status remains poor.             Images discussed with interventional radiology.  They would not recommend CT guided lung or liver biopsy (high risk).             Discuss consideration of liver MRI as it may be able to confirm metastatic disease by imaging characteristics.   Patient unsure if she has metal in her body (screws/pins in ankle and/or wrist).  Patient has been followed by Dr Raul Del in the outpatient department.   Discuss obtaining his opinion regarding lung lesions.  I do not believe she would be a candidate for a bronchoscopy. 2.   Leukopenia and thrombocytopenia             New since last admission in 07/2018.             Diet has been poor.              Only new medications prior to admission included antibiotics (amoxicillin, ciprofloxacin, azithromycin) for UTI and cough.             Patient denied any herbal products.             PT/INR 15.3/1.3 and PTT 46 (both slightly elevated) with fibrinogen 165 (low) and platelets 89,000 c/w low grade DIC.   She was on Eliquis with admission then switched to prophylactic Lovenox.  No evidence of bleeding.   B12 was 324 and folate 9.4. TSH 4.854 (high).  Check free T4.             Peripheral smear revealed unremarkable RBC, WBC, and platelet morphology.             Monitor platelet count closely on Lovenox.      3.    Code status             Patient is DNR.               Per her medical power of attorney, she is not interested in feeding tubes.             Family is considering supportive care once diagnosis available; no treatment if cancer is discovered.  Patient and family considering Hospice if metastatic disease confirmed.   Lequita Asal, MD  12/17/2019, 8:24 PM

## 2019-12-17 NOTE — Progress Notes (Signed)
PROGRESS NOTE    Autumn Bradley  HQI:[id number] DOB: 06-08-1932 DOA: 12/15/2019 PCP: Adin Hector, MD   Brief Narrative:  Autumn Bradley is a 86 y.o. Caucasian female with medical history significant of systolic CHF, Afib on Eliquis, pleural effusions and pericardial effusion who presented with a myriad of complaints. Basically a failure to thrive and declining health.  There was some concern of some confusion and hallucinations over the past 1 month.  CT chest with multiple nodular opacities bilaterally in lung and liver, concern for metastatic disease with unknown primary.  Subjective: Patient denies any pain.  Still has no appetite.  Was unable to eat any breakfast.  Assessment & Plan:   Active Problems:   AMS (altered mental status)   Pulmonary nodules   Failure to thrive (0-17)   DNR (do not resuscitate)   Protein-calorie malnutrition, severe  Altered mental status/failure to thrive/falls. Per family concern patient is becoming confused and hallucinations at times.  She was alert and oriented x3 with me.  Able to answer all the questions appropriately.  Patient has a progressive decline over the past year.  She recently lost her son due to lymphoma. -Delirium precautions. -PT/OT evaluation-pending SNF placement.  Nodular opacities in lungs and liver Weight loss --concerning for metastatic disease.  Patient and her daughter-in-law was not aware of any prior diagnosis of any type of cancer.  She does not want very aggressive measures for diagnosis or treatment.  Daughter-in-law also wants to involve oncology for a second opinion.  She was also agreeable to see a palliative care for symptom management, as she wants patient to remain comfortable. -CT abdomen and pelvis-with numerous hepatic masses, advising dedicated liver MRI.  Small amount of free fluid within abdomen and pelvis. -Consult oncology-Dr. Mike Gip will review her imaging with IR and will come up some plan for  definitive diagnosis.  Patient's family wants diagnosis but might not consider chemotherapy if it is a metastatic disease. -Consult palliative care.  Hyponatremia.  It was thought to be due to poor p.o. intake.  See if some IV fluid without any hyponatremia labs.  There is some concern for paraneoplastic syndrome/SIADH.  Sodium at 127 today. -Continue IV fluid. -Continue to monitor sodium. -Check hyponatremia labs.  Chronic cough.  No leukocytosis, patient remained afebrile.  Procalcitonin negative.  Patient recently finished Z-Pak with no improvement.  There is concern for malignancy.  She was given a dose of ceftriaxone and azithromycin in the ED which was not continued. -Supportive care with cough suppressant.  Bilateral pleural effusions, chronic --L>R.  Left side probably loculated.  Had left thoracentesis back on 07/08/18.   --No current need for thoracentesis.  Large pericardial effusion, chronic, stable -Known condition in Jan 2020, appeared to be stable since then.  Afib on Eliquis -currently rate controlled.   -Holding home dose of Eliquis in case patient decided to go for biopsy.  Hx of chronic systolic CHF with LVEF 13-24% -BNP 781, but does not appear fluid overloaded.  Only on Lasix 20 mg PRN at home. PLAN: -Hold diuretic since BP low.  Glaucoma -continue home eye drops  Objective: Vitals:   12/17/19 0338 12/17/19 0729 12/17/19 1209 12/17/19 1514  BP: (!) 99/59 113/74 98/68 105/75  Pulse: 63 89 68 73  Resp: '20 16 20 16  '$ Temp: 97.9 F (36.6 C) 97.8 F (36.6 C) 97.6 F (36.4 C) 97.9 F (36.6 C)  TempSrc:  Oral Oral Oral  SpO2: 95% 93% 91% 94%  Weight:      Height:        Intake/Output Summary (Last 24 hours) at 12/17/2019 1819 Last data filed at 12/17/2019 1300 Gross per 24 hour  Intake 220 ml  Output --  Net 220 ml   Filed Weights   12/15/19 1137  Weight: 56.7 kg    Examination:  General exam: Frail elderly lady with multiple ecchymosis,  appears calm and comfortable  Respiratory system: Clear to auscultation. Respiratory effort normal. Cardiovascular system: S1 & S2 heard, RRR. No JVD, murmurs, rubs,  Gastrointestinal system: Soft, nontender, nondistended, bowel sounds positive. Central nervous system: Alert and oriented. No focal neurological deficits. Extremities: No edema, no cyanosis, pulses intact and symmetrical. Psychiatry: Judgement and insight appear normal.   DVT prophylaxis: Lovenox Code Status: DNR Family Communication: Daughter-in-law was updated at bedside. Disposition Plan:  Status is: Inpatient  Remains inpatient appropriate because:Ongoing diagnostic testing needed not appropriate for outpatient work up   Dispo: The patient is from: Home              Anticipated d/c is to: To be determined              Anticipated d/c date is: 2 days              Patient currently is not medically stable to d/c.  Patient with concern of metastatic disease.  Needs further evaluation.  Palliative care was also consulted as she might qualify for hospice care as she does not want to pursue very aggressive measures.  Consultants:   Oncology  Palliative care  Procedures:  Antimicrobials:   Data Reviewed: I have personally reviewed following labs and imaging studies  CBC: Recent Labs  Lab 12/15/19 1153 12/16/19 0502 12/17/19 0352  WBC 3.7* 3.1* 4.2  HGB 16.6* 16.4* 17.0*  HCT 46.8* 47.8* 49.2*  MCV 85.1 87.4 87.1  PLT 94* 92* 89*   Basic Metabolic Panel: Recent Labs  Lab 12/15/19 1153 12/16/19 0502 12/17/19 0352  NA 127* 129* 127*  K 4.7 4.4 4.9  CL 92* 93* 92*  CO2 '26 24 23  '$ GLUCOSE 149* 113* 111*  BUN 18 16 25*  CREATININE 0.93 0.85 1.01*  CALCIUM 8.5* 8.0* 8.3*  MG  --  1.8 2.1   GFR: Estimated Creatinine Clearance: 35.8 mL/min (A) (by C-G formula based on SCr of 1.01 mg/dL (H)). Liver Function Tests: Recent Labs  Lab 12/15/19 1153  AST 70*  ALT 27  ALKPHOS 72  BILITOT 1.6*  PROT  5.7*  ALBUMIN 3.5   No results for input(s): LIPASE, AMYLASE in the last 168 hours. No results for input(s): AMMONIA in the last 168 hours. Coagulation Profile: Recent Labs  Lab 12/17/19 0352  INR 1.3*   Cardiac Enzymes: No results for input(s): CKTOTAL, CKMB, CKMBINDEX, TROPONINI in the last 168 hours. BNP (last 3 results) No results for input(s): PROBNP in the last 8760 hours. HbA1C: No results for input(s): HGBA1C in the last 72 hours. CBG: No results for input(s): GLUCAP in the last 168 hours. Lipid Profile: No results for input(s): CHOL, HDL, LDLCALC, TRIG, CHOLHDL, LDLDIRECT in the last 72 hours. Thyroid Function Tests: Recent Labs    12/17/19 0352  TSH 4.854*   Anemia Panel: Recent Labs    12/17/19 0352  VITAMINB12 324  FOLATE 9.4   Sepsis Labs: Recent Labs  Lab 12/15/19 1153 12/15/19 1257  PROCALCITON <0.10  --   LATICACIDVEN  --  1.7    Recent Results (from the  past 240 hour(s))  Urine culture     Status: None   Collection Time: 12/15/19 11:53 AM   Specimen: Urine, Random  Result Value Ref Range Status   Specimen Description   Final    URINE, RANDOM Performed at [facility], [address], [place], [place] [address]    Special Requests   Final    NONE Performed at [facility], [address] [place], [place] [address]    Culture   Final    NO GROWTH Performed at [facility], [place] [address], [place], [place] [address]    Report Status 12/16/2019 FINAL  Final  SARS Coronavirus 2 by RT PCR (hospital order, performed in [facility] hospital lab) Nasopharyngeal Nasopharyngeal Swab     Status: None   Collection Time: 12/15/19  2:38 PM   Specimen: Nasopharyngeal Swab  Result Value Ref Range Status   SARS Coronavirus 2 NEGATIVE NEGATIVE Final    Comment: (NOTE) SARS-CoV-2 target nucleic acids are NOT DETECTED.  The SARS-CoV-2 RNA is generally detectable in upper and lower respiratory specimens during the acute phase  of infection. The lowest concentration of SARS-CoV-2 viral copies this assay can detect is 250 copies / mL. A negative result does not preclude SARS-CoV-2 infection and should not be used as the sole basis for treatment or other patient management decisions.  A negative result may occur with improper specimen collection / handling, submission of specimen other than nasopharyngeal swab, presence of viral mutation(s) within the areas targeted by this assay, and inadequate number of viral copies (<250 copies / mL). A negative result must be combined with clinical observations, patient history, and epidemiological information.  Fact Sheet for Patients:   StrictlyIdeas.no  Fact Sheet for Healthcare Providers: BankingDealers.co.za  This test is not yet approved or  cleared by the [place] FDA and has been authorized for detection and/or diagnosis of SARS-CoV-2 by FDA under an Emergency Use Authorization (EUA).  This EUA will remain in effect (meaning this test can be used) for the duration of the COVID-19 declaration under Section 564(b)(1) of the Act, 21 U.S.C. section 360bbb-3(b)(1), unless the authorization is terminated or revoked sooner.  Performed at [facility], [address] [place], [place] [address]   Blood culture (routine x 2)     Status: None (Preliminary result)   Collection Time: 12/15/19  2:38 PM   Specimen: BLOOD  Result Value Ref Range Status   Specimen Description BLOOD LEFT ANTECUBITAL  Final   Special Requests   Final    BOTTLES DRAWN AEROBIC AND ANAEROBIC Blood Culture adequate volume   Culture   Final    NO GROWTH 2 DAYS Performed at [facility], [address] [place], [place] [address]    Report Status PENDING  Incomplete  Blood culture (routine x 2)     Status: None (Preliminary result)   Collection Time: 12/15/19  2:38 PM   Specimen: BLOOD  Result Value Ref Range Status    Specimen Description BLOOD BLOOD RIGHT ARM  Final   Special Requests   Final    BOTTLES DRAWN AEROBIC AND ANAEROBIC Blood Culture adequate volume   Culture   Final    NO GROWTH 2 DAYS Performed at [facility], [address] [place],  [address]    Report Status PENDING  Incomplete     Radiology Studies: CT ABDOMEN PELVIS W CONTRAST  Result Date: 12/16/2019 CLINICAL DATA:  Fair to thrive, multiple liver  lesions seen on chest CT, concern for metastatic disease EXAM: CT ABDOMEN AND PELVIS WITH CONTRAST TECHNIQUE: Multidetector CT imaging of the abdomen and pelvis was performed using the standard protocol following bolus administration of intravenous contrast. CONTRAST:  35m OMNIPAQUE IOHEXOL 300 MG/ML  SOLN COMPARISON:  12/15/2019 FINDINGS: Lower chest: Large pericardial effusion again noted unchanged. Bilateral pleural effusions are stable. Nodular consolidation at the lung bases consistent with suspected metastatic disease. Hepatobiliary: There are numerous hepatic masses, largest within segment 4 measuring 1.8 cm. Again, dedicated liver MRI recommended when clinically able for complete characterization. On this single phase examination, metastatic disease remains the diagnosis of exclusion. No intrahepatic duct dilation. Gallbladder is surgically absent. Pancreas: Unremarkable. No pancreatic ductal dilatation or surrounding inflammatory changes. Spleen: 8 mm hyperdensity within the posterior aspect of the spleen nonspecific, and may reflect small hemangioma. Otherwise the spleen is unremarkable. Adrenals/Urinary Tract: Bilateral adrenal thickening is nonspecific, metastatic disease not excluded given remaining findings. No change since prior study. There is bilateral renal cortical atrophy. No focal renal abnormalities. No urinary tract calculi or obstruction. Bladder is decompressed but otherwise unremarkable. Stomach/Bowel: No bowel obstruction or ileus. There is diffuse  diverticulosis of the sigmoid colon without evidence of diverticulitis. No bowel wall thickening or inflammatory change. Vascular/Lymphatic: Aortic atherosclerosis. No enlarged abdominal or pelvic lymph nodes. Reproductive: Status post hysterectomy. No adnexal masses. Other: Small amount of free fluid within the abdomen and pelvis. No free intraperitoneal gas. No abdominal wall hernia. Musculoskeletal: No acute or destructive bony lesions. Reconstructed images demonstrate no additional findings. IMPRESSION: 1. Numerous hepatic masses, largest within segment 4 measuring 1.8 cm. Again, dedicated liver MRI recommended when clinically able for complete characterization. On this single phase examination, metastatic disease remains the diagnosis of exclusion. 2. Nodular consolidation at the lung bases consistent with metastatic disease. 3. Large pericardial effusion and bilateral pleural effusions, stable. 4. Small amount of free fluid within the abdomen and pelvis. 5. Sigmoid diverticulosis without diverticulitis. 6. Aortic Atherosclerosis (ICD10-I70.0). Electronically Signed   By: MRanda NgoM.D.   On: 12/16/2019 19:47    Scheduled Meds: . atorvastatin  10 mg Oral QPM  . diltiazem  300 mg Oral QHS  . enoxaparin (LOVENOX) injection  40 mg Subcutaneous Q24H  . feeding supplement (ENSURE ENLIVE)  237 mL Oral BID BM  . latanoprost  1 drop Both Eyes QHS  . timolol  1 drop Both Eyes Daily   Continuous Infusions:   LOS: 2 days   Time spent: 40 minutes.  SLorella Nimrod MD [facility]  If 7PM-7AM, please contact night-coverage [email or web address]  12/17/2019, 6:19 PM   This record has been created using Dragon voice recognition software. Errors have been sought and corrected,but may not always be located. Such creation errors do not reflect on the standard of care.

## 2019-12-17 NOTE — Progress Notes (Signed)
Patient ID: Autumn Bradley, female   DOB: March 19, 1932, 86 y.o.   MRN: [id number]  This NP visited patient at the bedside as a follow up for palliative medicine needs and emotional support. I met at the patient's bedside with [name]/H POA we had continued conversation regarding current medical situation, treatment option decisions, advanced directive decisions and anticipatory care needs.  Again patient has had dramatic physical and functional decline over the past month, significant weight loss with poor appetite.  Patient's advanced directive documents desire for the withholding of life prolonging measures in the event of of terminal illness.  IMPRESSION: CT Abdome W contrast 12-16-19   1. Numerous hepatic masses, largest within segment 4 measuring 1.8 cm. Again, dedicated liver MRI recommended when clinically able for complete characterization. On this single phase examination, metastatic disease remains the diagnosis of exclusion. 2. Nodular consolidation at the lung bases consistent with metastatic disease. 3. Large pericardial effusion and bilateral pleural effusions, stable. 4. Small amount of free fluid within the abdomen and pelvis. 5. Sigmoid diverticulosis without diverticulitis. 6. Aortic Atherosclerosis (ICD10-I70.0).  Plan of Care: - DNR/DNI - no artificial  feeding now or in the future - family anticipates discussion with oncology later today  - Palliative medicine will continue to support holistically and help family navigate healthcare decisions.   Education offered regarding hospice benefit.  Anticipatory care needs discussed as it relates to both in-home care or the need for facility placement.   Discussed with HPOA the importance of continued conversation with family and the medical providers regarding overall plan of care and treatment options,  ensuring decisions are within the context of the patients values and GOCs.  Questions and concerns addressed     Total time  spent on the unit was 35 minutes  Greater than 50% of the time was spent in counseling and coordination of care  Wadie Lessen NP  Palliative Medicine Team Team Phone # [phone] Pager [phone]

## 2019-12-18 DIAGNOSIS — E43 Unspecified severe protein-calorie malnutrition: Secondary | ICD-10-CM

## 2019-12-18 LAB — CBC
HCT: 42 % (ref 36.0–46.0)
Hemoglobin: 15.2 g/dL — ABNORMAL HIGH (ref 12.0–15.0)
MCH: 30.3 pg (ref 26.0–34.0)
MCHC: 36.2 g/dL — ABNORMAL HIGH (ref 30.0–36.0)
MCV: 83.8 fL (ref 80.0–100.0)
Platelets: 103 10*3/uL — ABNORMAL LOW (ref 150–400)
RBC: 5.01 MIL/uL (ref 3.87–5.11)
RDW: 14 % (ref 11.5–15.5)
WBC: 3.7 10*3/uL — ABNORMAL LOW (ref 4.0–10.5)
nRBC: 0 % (ref 0.0–0.2)

## 2019-12-18 LAB — BASIC METABOLIC PANEL
Anion gap: 10 (ref 5–15)
BUN: 33 mg/dL — ABNORMAL HIGH (ref 8–23)
CO2: 25 mmol/L (ref 22–32)
Calcium: 8.6 mg/dL — ABNORMAL LOW (ref 8.9–10.3)
Chloride: 91 mmol/L — ABNORMAL LOW (ref 98–111)
Creatinine, Ser: 1.24 mg/dL — ABNORMAL HIGH (ref 0.44–1.00)
GFR calc Af Amer: 46 mL/min — ABNORMAL LOW (ref >60)
GFR calc non Af Amer: 39 mL/min — ABNORMAL LOW (ref >60)
Glucose, Bld: 117 mg/dL — ABNORMAL HIGH (ref 70–99)
Potassium: 4.8 mmol/L (ref 3.5–5.1)
Sodium: 126 mmol/L — ABNORMAL LOW (ref 135–145)

## 2019-12-18 LAB — MAGNESIUM: Magnesium: 2.2 mg/dL (ref 1.7–2.4)

## 2019-12-18 LAB — T4, FREE: Free T4: 1.32 ng/dL — ABNORMAL HIGH (ref 0.61–1.12)

## 2019-12-18 MED ORDER — ENOXAPARIN SODIUM 30 MG/0.3ML ~~LOC~~ SOLN
30.0000 mg | SUBCUTANEOUS | Status: DC
  Administered 2019-12-18 – 2019-12-20 (×3): 30 mg via SUBCUTANEOUS
  Filled 2019-12-18 (×3): qty 0.3

## 2019-12-18 MED ORDER — LACTATED RINGERS IV SOLN: INTRAVENOUS | Status: DC

## 2019-12-18 NOTE — Progress Notes (Signed)
[facility] Hematology/Oncology Note  Date of admission: 12/15/2019  Hospital day:  12/18/2019   I spoke with the patient's daughter-in-law, [name], on the phone today.  I reviewed the inability to obtain tissue to diagnose metastatic disease.  The radiologist I spoke to felt an MRI of the liver might be helpful to further characterize lesions in the liver.  I have spoke to Dr Raul Del, her pulmonologist, about her lung lesions.  He will see the patient tomorrow.  She noted that the patient remains full care.  She is eating very little .  She is not interested in tube feeds.  Her family does not believe that they can care for her at home.  They are discussing a skilled nursing facility.  We also discussed Hospice.  She inquired about a PET scan.  PET scans can only be performed in the outpatient department.  Several questions were asked and answered.   Lequita Asal, MD  12/18/2019, 11:34 PM

## 2019-12-18 NOTE — Progress Notes (Signed)
Physical Therapy Treatment Patient Details Name: Autumn Bradley MRN: [id number] DOB: [birth year] Today's Date: 12/18/2019    History of Present Illness Pt is an 86 y/o F with PMH: sCHF, AFib, HTN, and HLD who presented with altered mental status, general weakness x1 month, weight loss, and recent UTI last week and has completed a round abx w/ little to no improvement per family.  W/u included CT of chest with multiple nodular opacities bilaterally in lung and liver, concern for metastatic disease with unknown primary. Of note: son also reports recent fall OOB (1WA) and pt presents with bruise to L elbow and L upper face.    PT Comments    Pt in bed, wanting to get up to eat.  Pt found to be inc small loose BM.  Pt unaware.  Rolling left/right with rail and no physical assist.  To EOB with min a x 1 - pt reaches and pulls up on my arm despite cues to push from bed.  Reports some back discomfort with transition.  Generally steady in sitting.  Stood with min/mod a x 1 and pulls on walker despite verbal and tactile cues for proper placement.  Transfers to recliner with min a x 1.  AFter short rest, she stands and is able to make a small loop along bed with overall poor walker position and flexed trunk.  Verbal and tactile cues to improve positioning and to assist with turning walker.  Requires hands on assist at all times and fatigues quickly but does not sit today until fully turned to chair.  Remained up for breakfast.   Follow Up Recommendations  SNF     Equipment Recommendations  Rolling walker with 5" wheels;3in1 (PT)    Recommendations for Other Services       Precautions / Restrictions Precautions Precautions: Fall    Mobility  Bed Mobility Overal bed mobility: Needs Assistance Bed Mobility: Supine to Sit     Supine to sit: Min assist     General bed mobility comments: reaches for my hand despite cues to push on bed.  Transfers Overall transfer level: Needs  assistance Equipment used: Rolling walker (2 wheeled) Transfers: Sit to/from Stand Sit to Stand: Min assist;Mod assist            Ambulation/Gait Ambulation/Gait assistance: Min Web designer (Feet): 10 Feet Assistive device: Rolling walker (2 wheeled) Gait Pattern/deviations: Step-to pattern;Decreased step length - right;Decreased step length - left;Trunk flexed Gait velocity: decreased   General Gait Details: mod verbal cues to keep walker closer and physical assist to reposition walker, generally unsafe with hands on assist needed at all times.   Stairs             Wheelchair Mobility    Modified Rankin (Stroke Patients Only)       Balance Overall balance assessment: Needs assistance Sitting-balance support: No upper extremity supported;Feet supported Sitting balance-Leahy Scale: Good     Standing balance support: Bilateral upper extremity supported Standing balance-Leahy Scale: Poor Standing balance comment: mod assist for postural extension and balance in all planes                            Cognition Arousal/Alertness: Awake/alert Behavior During Therapy: WFL for tasks assessed/performed Overall Cognitive Status: Within Functional Limits for tasks assessed  Exercises      General Comments        Pertinent Vitals/Pain Pain Assessment: Faces Faces Pain Scale: Hurts a little bit Pain Location: L knee, general discomfort Pain Descriptors / Indicators: Aching;Grimacing;Guarding Pain Intervention(s): Limited activity within patient's tolerance;Monitored during session;Repositioned    Home Living                      Prior Function            PT Goals (current goals can now be found in the care plan section) Progress towards PT goals: Progressing toward goals    Frequency    Min 2X/week      PT Plan Current plan remains appropriate    Co-evaluation               AM-PAC PT "6 Clicks" Mobility   Outcome Measure  Help needed turning from your back to your side while in a flat bed without using bedrails?: A Little Help needed moving from lying on your back to sitting on the side of a flat bed without using bedrails?: A Lot Help needed moving to and from a bed to a chair (including a wheelchair)?: A Lot Help needed standing up from a chair using your arms (e.g., wheelchair or bedside chair)?: A Lot Help needed to walk in hospital room?: A Lot Help needed climbing 3-5 steps with a railing? : Total 6 Click Score: 12    End of Session Equipment Utilized During Treatment: Gait belt Activity Tolerance: Patient tolerated treatment well Patient left: in chair;with call bell/phone within reach;with nursing/sitter in room;with family/visitor present;with chair alarm set Nurse Communication: Mobility status       Time: 4712-5271 PT Time Calculation (min) (ACUTE ONLY): 16 min  Charges:  $Gait Training: 8-22 mins                    Chesley Noon, PTA 12/18/19, 9:52 AM

## 2019-12-18 NOTE — Progress Notes (Signed)
Pt refused any pills this shift...including Cardizem.  BP has been trending down.  Had a 15-20 beat run of Vtach this am.  MD made aware.

## 2019-12-18 NOTE — Progress Notes (Signed)
PROGRESS NOTE    Autumn Bradley  DZH:[id number] DOB: Nov 26, 1931 DOA: 12/15/2019 PCP: Adin Hector, MD   Brief Narrative:  Autumn Bradley is a 86 y.o. Caucasian female with medical history significant of systolic CHF, Afib on Eliquis, pleural effusions and pericardial effusion who presented with a myriad of complaints. Basically a failure to thrive and declining health.  There was some concern of some confusion and hallucinations over the past 1 month.  CT chest with multiple nodular opacities bilaterally in lung and liver, concern for metastatic disease with unknown primary.  Subjective: Patient denies any pain.  Still has no appetite.  Son was at bedside.  Family really wants to get a proper diagnosis.  Assessment & Plan:   Active Problems:   AMS (altered mental status)   Pulmonary nodules   Failure to thrive (0-17)   DNR (do not resuscitate)   Protein-calorie malnutrition, severe  Altered mental status/failure to thrive/falls. Per family concern patient is becoming confused and hallucinations at times.  She was alert and oriented x3 with me.  Able to answer all the questions appropriately.  Patient has a progressive decline over the past year.  She recently lost her son due to lymphoma. -Delirium precautions. -PT/OT evaluation-pending SNF placement. -She might be a good candidate for hospice services due to most likely a metastatic disease.  Nodular opacities in lungs and liver Weight loss --concerning for metastatic disease.  Patient and her daughter-in-law was not aware of any prior diagnosis of any type of cancer.  She does not want very aggressive measures for diagnosis or treatment.  Daughter-in-law also wants to involve oncology for a second opinion.  She was also agreeable to see a palliative care for symptom management, as she wants patient to remain comfortable. -CT abdomen and pelvis-with numerous hepatic masses, advising dedicated liver MRI.  Small amount of free fluid  within abdomen and pelvis. -Consult oncology-Dr. Mike Gip will review her imaging with IR and will come up some plan for definitive diagnosis.  Patient's family wants diagnosis but might not consider chemotherapy if it is a metastatic disease. -Consult palliative care. -I discussed her case with radiology and they do not think that MRI liver will add much but certainly can be done after her creatinine improves. -Order MRI liver tomorrow, giving some IV fluid for AKI.  Patient did receive contrast for CT scans before.  Poor p.o. intake. -PET Scan can be considered as an outpatient.  AKI.  Patient had a creatinine bumped to 1.24 with GFR of 29 today.  Most likely secondary to poor p.o. intake and she also received contrast for CT scans before. -Her some IV fluid. -Continue to monitor. -Avoid nephrotoxins  Hyponatremia.  It was thought to be due to poor p.o. intake.  See if some IV fluid without any hyponatremia labs.  There is some concern for paraneoplastic syndrome/SIADH.  Sodium at 127 today. -Continue IV fluid. -Continue to monitor sodium. -Check hyponatremia labs-still pending.  Chronic cough.  No leukocytosis, patient remained afebrile.  Procalcitonin negative.  Patient recently finished Z-Pak with no improvement.  There is concern for malignancy.  She was given a dose of ceftriaxone and azithromycin in the ED which was not continued. -Supportive care with cough suppressant.  Bilateral pleural effusions, chronic --L>R.  Left side probably loculated.  Had left thoracentesis back on 07/08/18.   --No current need for thoracentesis.  Large pericardial effusion, chronic, stable -Known condition in Jan 2020, appeared to be stable since then.  Afib on Eliquis -currently rate controlled.   -Holding home dose of Eliquis in case patient decided to go for biopsy.  Hx of chronic systolic CHF with LVEF 34-74% -BNP 781, but does not appear fluid overloaded.  Only on Lasix 20 mg PRN at  home. PLAN: -Hold diuretic since BP low.  Glaucoma -continue home eye drops  Objective: Vitals:   12/18/19 0511 12/18/19 0903 12/18/19 1137 12/18/19 1435  BP: (!) 99/56 103/71 106/70 115/72  Pulse: 92 97 82 89  Resp: '19 20 15 15  '$ Temp: 97.8 F (36.6 C) (!) 97.5 F (36.4 C) (!) 97.4 F (36.3 C) (!) 97.5 F (36.4 C)  TempSrc:  Oral Oral Axillary  SpO2: 94% 95% 96% 96%  Weight:      Height:        Intake/Output Summary (Last 24 hours) at 12/18/2019 1552 Last data filed at 12/18/2019 1300 Gross per 24 hour  Intake 240 ml  Output 200 ml  Net 40 ml   Filed Weights   12/15/19 1137  Weight: 56.7 kg    Examination:  General exam: Frail elderly lady with multiple ecchymosis, appears calm and comfortable  Respiratory system: Clear to auscultation. Respiratory effort normal. Cardiovascular system: S1 & S2 heard, RRR. No JVD, murmurs, rubs,  Gastrointestinal system: Soft, nontender, nondistended, bowel sounds positive. Central nervous system: Alert and oriented. No focal neurological deficits. Extremities: No edema, no cyanosis, pulses intact and symmetrical. Psychiatry: Judgement and insight appear normal.   DVT prophylaxis: Lovenox Code Status: DNR Family Communication: Son was updated at bedside. Disposition Plan:  Status is: Inpatient  Remains inpatient appropriate because:Ongoing diagnostic testing needed not appropriate for outpatient work up   Dispo: The patient is from: Home              Anticipated d/c is to: To be determined              Anticipated d/c date is: 2 days              Patient currently is not medically stable to d/c.  Patient with concern of metastatic disease.  Needs further evaluation.  Palliative care was also consulted as she might qualify for hospice care as she does not want to pursue very aggressive measures.  Family really wants some definitive diagnosis before discharging.  Consultants:   Oncology  Palliative care  Procedures:   Antimicrobials:   Data Reviewed: I have personally reviewed following labs and imaging studies  CBC: Recent Labs  Lab 12/15/19 1153 12/16/19 0502 12/17/19 0352 12/18/19 0507  WBC 3.7* 3.1* 4.2 3.7*  HGB 16.6* 16.4* 17.0* 15.2*  HCT 46.8* 47.8* 49.2* 42.0  MCV 85.1 87.4 87.1 83.8  PLT 94* 92* 89* 259*   Basic Metabolic Panel: Recent Labs  Lab 12/15/19 1153 12/16/19 0502 12/17/19 0352 12/18/19 0507  NA 127* 129* 127* 126*  K 4.7 4.4 4.9 4.8  CL 92* 93* 92* 91*  CO2 '26 24 23 25  '$ GLUCOSE 149* 113* 111* 117*  BUN 18 16 25* 33*  CREATININE 0.93 0.85 1.01* 1.24*  CALCIUM 8.5* 8.0* 8.3* 8.6*  MG  --  1.8 2.1 2.2   GFR: Estimated Creatinine Clearance: 29.2 mL/min (A) (by C-G formula based on SCr of 1.24 mg/dL (H)). Liver Function Tests: Recent Labs  Lab 12/15/19 1153  AST 70*  ALT 27  ALKPHOS 72  BILITOT 1.6*  PROT 5.7*  ALBUMIN 3.5   No results for input(s): LIPASE, AMYLASE in the last 168  hours. No results for input(s): AMMONIA in the last 168 hours. Coagulation Profile: Recent Labs  Lab 12/17/19 0352  INR 1.3*   Cardiac Enzymes: No results for input(s): CKTOTAL, CKMB, CKMBINDEX, TROPONINI in the last 168 hours. BNP (last 3 results) No results for input(s): PROBNP in the last 8760 hours. HbA1C: No results for input(s): HGBA1C in the last 72 hours. CBG: No results for input(s): GLUCAP in the last 168 hours. Lipid Profile: No results for input(s): CHOL, HDL, LDLCALC, TRIG, CHOLHDL, LDLDIRECT in the last 72 hours. Thyroid Function Tests: Recent Labs    12/17/19 0352 12/18/19 0507  TSH 4.854*  --   FREET4  --  1.32*   Anemia Panel: Recent Labs    12/17/19 0352  VITAMINB12 324  FOLATE 9.4   Sepsis Labs: Recent Labs  Lab 12/15/19 1153 12/15/19 1257  PROCALCITON <0.10  --   LATICACIDVEN  --  1.7    Recent Results (from the past 240 hour(s))  Urine culture     Status: None   Collection Time: 12/15/19 11:53 AM   Specimen: Urine, Random   Result Value Ref Range Status   Specimen Description   Final    URINE, RANDOM Performed at [facility], [address] [place], [place] [address]    Special Requests   Final    NONE Performed at [facility], [address], [place], [place] [address]    Culture   Final    NO GROWTH Performed at [facility], [place] [address], [place], [place] [address]    Report Status 12/16/2019 FINAL  Final  SARS Coronavirus 2 by RT PCR (hospital order, performed in [facility] hospital lab) Nasopharyngeal Nasopharyngeal Swab     Status: None   Collection Time: 12/15/19  2:38 PM   Specimen: Nasopharyngeal Swab  Result Value Ref Range Status   SARS Coronavirus 2 NEGATIVE NEGATIVE Final    Comment: (NOTE) SARS-CoV-2 target nucleic acids are NOT DETECTED.  The SARS-CoV-2 RNA is generally detectable in upper and lower respiratory specimens during the acute phase of infection. The lowest concentration of SARS-CoV-2 viral copies this assay can detect is 250 copies / mL. A negative result does not preclude SARS-CoV-2 infection and should not be used as the sole basis for treatment or other patient management decisions.  A negative result may occur with improper specimen collection / handling, submission of specimen other than nasopharyngeal swab, presence of viral mutation(s) within the areas targeted by this assay, and inadequate number of viral copies (<250 copies / mL). A negative result must be combined with clinical observations, patient history, and epidemiological information.  Fact Sheet for Patients:   StrictlyIdeas.no  Fact Sheet for Healthcare Providers: BankingDealers.co.za  This test is not yet approved or  cleared by the [place] FDA and has been authorized for detection and/or diagnosis of SARS-CoV-2 by FDA under an Emergency Use Authorization (EUA).  This EUA will remain in effect (meaning this test  can be used) for the duration of the COVID-19 declaration under Section 564(b)(1) of the Act, 21 U.S.C. section 360bbb-3(b)(1), unless the authorization is terminated or revoked sooner.  Performed at [facility], [address] [place], [place] [address]   Blood culture (routine x 2)     Status: None (Preliminary result)   Collection Time: 12/15/19  2:38 PM   Specimen: BLOOD  Result Value Ref Range Status   Specimen Description BLOOD LEFT ANTECUBITAL  Final   Special Requests  Final    BOTTLES DRAWN AEROBIC AND ANAEROBIC Blood Culture adequate volume   Culture   Final    NO GROWTH 3 DAYS Performed at [facility], [address] [place], [place] [address]    Report Status PENDING  Incomplete  Blood culture (routine x 2)     Status: None (Preliminary result)   Collection Time: 12/15/19  2:38 PM   Specimen: BLOOD  Result Value Ref Range Status   Specimen Description BLOOD BLOOD RIGHT ARM  Final   Special Requests   Final    BOTTLES DRAWN AEROBIC AND ANAEROBIC Blood Culture adequate volume   Culture   Final    NO GROWTH 3 DAYS Performed at [facility], [address], [place], [place] [address]    Report Status PENDING  Incomplete     Radiology Studies: CT ABDOMEN PELVIS W CONTRAST  Result Date: 12/16/2019 CLINICAL DATA:  Fair to thrive, multiple liver lesions seen on chest CT, concern for metastatic disease EXAM: CT ABDOMEN AND PELVIS WITH CONTRAST TECHNIQUE: Multidetector CT imaging of the abdomen and pelvis was performed using the standard protocol following bolus administration of intravenous contrast. CONTRAST:  97m OMNIPAQUE IOHEXOL 300 MG/ML  SOLN COMPARISON:  12/15/2019 FINDINGS: Lower chest: Large pericardial effusion again noted unchanged. Bilateral pleural effusions are stable. Nodular consolidation at the lung bases consistent with suspected metastatic disease. Hepatobiliary: There are numerous hepatic masses, largest within segment 4  measuring 1.8 cm. Again, dedicated liver MRI recommended when clinically able for complete characterization. On this single phase examination, metastatic disease remains the diagnosis of exclusion. No intrahepatic duct dilation. Gallbladder is surgically absent. Pancreas: Unremarkable. No pancreatic ductal dilatation or surrounding inflammatory changes. Spleen: 8 mm hyperdensity within the posterior aspect of the spleen nonspecific, and may reflect small hemangioma. Otherwise the spleen is unremarkable. Adrenals/Urinary Tract: Bilateral adrenal thickening is nonspecific, metastatic disease not excluded given remaining findings. No change since prior study. There is bilateral renal cortical atrophy. No focal renal abnormalities. No urinary tract calculi or obstruction. Bladder is decompressed but otherwise unremarkable. Stomach/Bowel: No bowel obstruction or ileus. There is diffuse diverticulosis of the sigmoid colon without evidence of diverticulitis. No bowel wall thickening or inflammatory change. Vascular/Lymphatic: Aortic atherosclerosis. No enlarged abdominal or pelvic lymph nodes. Reproductive: Status post hysterectomy. No adnexal masses. Other: Small amount of free fluid within the abdomen and pelvis. No free intraperitoneal gas. No abdominal wall hernia. Musculoskeletal: No acute or destructive bony lesions. Reconstructed images demonstrate no additional findings. IMPRESSION: 1. Numerous hepatic masses, largest within segment 4 measuring 1.8 cm. Again, dedicated liver MRI recommended when clinically able for complete characterization. On this single phase examination, metastatic disease remains the diagnosis of exclusion. 2. Nodular consolidation at the lung bases consistent with metastatic disease. 3. Large pericardial effusion and bilateral pleural effusions, stable. 4. Small amount of free fluid within the abdomen and pelvis. 5. Sigmoid diverticulosis without diverticulitis. 6. Aortic Atherosclerosis  (ICD10-I70.0). Electronically Signed   By: MRanda NgoM.D.   On: 12/16/2019 19:47    Scheduled Meds: . atorvastatin  10 mg Oral QPM  . diltiazem  300 mg Oral QHS  . enoxaparin (LOVENOX) injection  30 mg Subcutaneous Q24H  . feeding supplement (ENSURE ENLIVE)  237 mL Oral BID BM  . latanoprost  1 drop Both Eyes QHS  . timolol  1 drop Both Eyes Daily   Continuous Infusions: . lactated ringers 100 mL/hr at 12/18/19 0915     LOS: 3 days  Time spent: 40 minutes.  Lorella Nimrod, MD [facility]  If 7PM-7AM, please contact night-coverage [email or web address]  12/18/2019, 3:52 PM   This record has been created using Systems analyst. Errors have been sought and corrected,but may not always be located. Such creation errors do not reflect on the standard of care.

## 2019-12-18 NOTE — Progress Notes (Signed)
Patient ID: Autumn Bradley, female   DOB: Feb 18, 1932, 86 y.o.   MRN: [id number]  This NP  followed up for palliative medicine needs and emotional support.  Discussed patient with bedside RN.  Patient was able to tolerate out of bed to the chair today for a couple of hours and continues to  take limited oral intake.  I spoke to patient's HPOA/ Autumn Bradley by telephone  for  continued conversation/education  regarding current medical situation, treatment option decisions, advanced directive decisions and anticipatory care needs.  Again patient has had dramatic physical and functional decline over the past month, significant weight loss with poor appetite.  Patient's advanced directive documents desire for the withholding of life prolonging measures in the event of of terminal illness.  IMPRESSION: CT Abdome W contrast 12-16-19   1. Numerous hepatic masses, largest within segment 4 measuring 1.8 cm. Again, dedicated liver MRI recommended when clinically able for complete characterization. On this single phase examination, metastatic disease remains the diagnosis of exclusion. 2. Nodular consolidation at the lung bases consistent with metastatic disease. 3. Large pericardial effusion and bilateral pleural effusions, stable. 4. Small amount of free fluid within the abdomen and pelvis. 5. Sigmoid diverticulosis without diverticulitis. 6. Aortic Atherosclerosis (ICD10-I70.0).  Plan of Care: - DNR/DNI - no artificial  feeding now or in the future - family requesting more diagnostics in order to know "what is the real diagnosis", as this information will help family with decisions regarding next steps in care plan    Awaiting MRI Given the patient's AD/ no desire for aggressive life prolonging interventions, conversation had regarding the need to know and the patient's documented wishes.  Emotional support offered    - Palliative medicine will continue to support holistically and help family navigate  healthcare decisions.  -I had continued conversation regarding anticipatory care needs.. She was concerned that patient's son was verbalizing that "someone came into the room talking about rehab".  "We are not ready to make that decision"  We discussed next steps in transition of care options as it relates to both in-home care or need for facility placement.   Patient likely would be eligible for short-term rehab however family verbalizes concern that this may not be the best thing for her.  "She was only out of the bed to the chair for 15 minutes yesterday and needed to go back to bed".  Education offered regarding hospice benefit.  Hospice benefit detailed specifically both in-home and residential inpatient and skilled nursing facility.  Discussed with HPOA the importance of continued conversation with family and the medical providers regarding overall plan of care and treatment options,  ensuring decisions are within the context of the patients values and GOCs.  Questions and concerns addressed        Total time spent on the unit was 35 minutes  Discussed with treatment team via secure chat.  This nurse practitioner informed  the family that I will be not be working at  [facility] next week, updated by colleagues  for ongoing f/u, family given contact information for needs and encouraged to call.  Call palliative medicine team phone # [phone] with questions or concerns.  Greater than 50% of the time was spent in counseling and coordination of care  Wadie Lessen NP  Palliative Medicine Team Team Phone # [phone] Pager [phone]

## 2019-12-18 NOTE — Progress Notes (Signed)
PHARMACIST - PHYSICIAN COMMUNICATION  CONCERNING:  Enoxaparin (Lovenox) for DVT Prophylaxis    RECOMMENDATION: Patient was prescribed enoxaprin 40mg  q24 hours for VTE prophylaxis.   Filed Weights   12/15/19 1137  Weight: 56.7 kg (125 lb)    Body mass index is 20.18 kg/m.  Estimated Creatinine Clearance: 29.2 mL/min (A) (by C-G formula based on SCr of 1.24 mg/dL (H)).   Patient is candidate for enoxaparin 30mg  every 24 hours based on CrCl <40ml/min  DESCRIPTION: Pharmacy has adjusted enoxaparin dose per [facility] policy.  Patient is now receiving enoxaparin 30mg  every 24 hours.  Lu Duffel, PharmD, BCPS Clinical Pharmacist 12/18/2019 8:06 AM

## 2019-12-18 NOTE — Care Management Important Message (Signed)
Important Message  Patient Details  Name: Autumn Bradley MRN: [id number] Date of Birth: January 26, 1932   Medicare Important Message Given:  Yes     Juliann Pulse A Markeise Mathews 12/18/2019, 11:04 AM

## 2019-12-18 NOTE — TOC Progression Note (Signed)
Transition of Care [facility]) - Progression Note    Patient Details  Name: Autumn Bradley MRN: [id number] Date of Birth: [birth year]  Transition of Care [facility]) CM/SW Contact  Enes Wegener, Gardiner Rhyme, LCSW Phone Number: 12/18/2019, 10:50 AM  Clinical Narrative:  Met with son who is at the bedside to discuss plan at DC. He reports they need to find out what is going on medically with Mom before can work toward DC. He was present when PT saw pt and recommended SNF. Can begin the paperwork process and await medical readiness. Will reach out to [name]-daughter in-law and also her HCPOA to discuss preferences and plan once medically ready for DC.    Expected Discharge Plan: Home/Self Care Barriers to Discharge: Continued Medical Work up  Expected Discharge Plan and Services Expected Discharge Plan: Home/Self Care In-house Referral: Clinical Social Work     Living arrangements for the past 2 months: Single Family Home                                       Social Determinants of Health (SDOH) Interventions    Readmission Risk Interventions No flowsheet data found.

## 2019-12-19 ENCOUNTER — Other Ambulatory Visit: Payer: Self-pay

## 2019-12-19 ENCOUNTER — Ambulatory Visit: Payer: Medicare HMO

## 2019-12-19 ENCOUNTER — Encounter: Payer: Self-pay | Admitting: Hospitalist

## 2019-12-19 ENCOUNTER — Inpatient Hospital Stay: Payer: Medicare HMO

## 2019-12-19 DIAGNOSIS — R638 Other symptoms and signs concerning food and fluid intake: Secondary | ICD-10-CM

## 2019-12-19 LAB — RENAL FUNCTION PANEL
Albumin: 2.7 g/dL — ABNORMAL LOW (ref 3.5–5.0)
Anion gap: 7 (ref 5–15)
BUN: 25 mg/dL — ABNORMAL HIGH (ref 8–23)
CO2: 28 mmol/L (ref 22–32)
Calcium: 8.4 mg/dL — ABNORMAL LOW (ref 8.9–10.3)
Chloride: 93 mmol/L — ABNORMAL LOW (ref 98–111)
Creatinine, Ser: 0.84 mg/dL (ref 0.44–1.00)
GFR calc Af Amer: >60 mL/min (ref >60)
GFR calc non Af Amer: >60 mL/min (ref >60)
Glucose, Bld: 110 mg/dL — ABNORMAL HIGH (ref 70–99)
Phosphorus: 2.5 mg/dL (ref 2.5–4.6)
Potassium: 4.2 mmol/L (ref 3.5–5.1)
Sodium: 128 mmol/L — ABNORMAL LOW (ref 135–145)

## 2019-12-19 LAB — SEDIMENTATION RATE: Sed Rate: 1 mm/hr (ref 0–30)

## 2019-12-19 LAB — OSMOLALITY, URINE: Osmolality, Ur: 785 mOsm/kg (ref 300–900)

## 2019-12-19 LAB — SODIUM, URINE, RANDOM: Sodium, Ur: 31 mmol/L

## 2019-12-19 MED ORDER — GADOBUTROL 1 MMOL/ML IV SOLN
6.0000 mL | Freq: Once | INTRAVENOUS | Status: AC | PRN
  Administered 2019-12-19: 6 mL via INTRAVENOUS

## 2019-12-19 NOTE — NC FL2 (Signed)
[place] LEVEL OF CARE SCREENING TOOL     IDENTIFICATION  Patient Name: Autumn Bradley Birthdate: Sep 28, 1931 Sex: female Admission Date (Current Location): 12/15/2019  [place] and [place] Number:  Engineering geologist and Address:  [facility], [address] [place], [place] [address]      Provider Number: [id number]  Attending Physician Name and Address:  Lorella Nimrod, MD  Relative Name and Phone Number:  Valla Leaver in-law-HCPOA  [phone]    Current Level of Care: Hospital Recommended Level of Care: [place] Prior Approval Number:    Date Approved/Denied:   PASRR Number: [id number]  Discharge Plan: SNF    Current Diagnoses: Patient Active Problem List   Diagnosis Date Noted  . Poor fluid intake   . Protein-calorie malnutrition, severe 12/16/2019  . Pulmonary nodules   . Failure to thrive (0-17)   . DNR (do not resuscitate)   . AMS (altered mental status) 12/15/2019  . Recurrent left pleural effusion   . Goals of care, counseling/discussion   . Palliative care by specialist   . CHF (congestive heart failure) ([place]) 07/07/2018    Orientation RESPIRATION BLADDER Height & Weight     Self, Place  Normal External catheter Weight: 125 lb (56.7 kg) Height:  5\' 6"  (167.6 cm)  BEHAVIORAL SYMPTOMS/MOOD NEUROLOGICAL BOWEL NUTRITION STATUS      Continent Diet (regular thin liquids)  AMBULATORY STATUS COMMUNICATION OF NEEDS Skin   Extensive Assist Verbally Normal                       Personal Care Assistance Level of Assistance  Bathing, Dressing Bathing Assistance: Limited assistance   Dressing Assistance: Limited assistance     Functional Limitations Info             SPECIAL CARE FACTORS FREQUENCY  PT (By licensed PT), OT (By licensed OT)     PT Frequency: 5x week OT Frequency: 5x week            Contractures Contractures Info: Not present    Additional Factors Info   Code Status, Allergies Code Status Info: DNR Allergies Info: NKDA           Current Medications (12/19/2019):  This is the current hospital active medication list Current Facility-Administered Medications  Medication Dose Route Frequency Provider Last Rate Last Admin  . atorvastatin (LIPITOR) tablet 10 mg  10 mg Oral QPM Enzo Bi, MD   10 mg at 12/18/19 1817  . diltiazem (CARDIZEM CD) 24 hr capsule 300 mg  300 mg Oral QHS Benita Gutter, RPH   300 mg at 12/18/19 2110  . enoxaparin (LOVENOX) injection 30 mg  30 mg Subcutaneous Q24H Lu Duffel, RPH   30 mg at 12/18/19 2110  . feeding supplement (ENSURE ENLIVE) (ENSURE ENLIVE) liquid 237 mL  237 mL Oral BID BM Enzo Bi, MD   237 mL at 12/19/19 1331  . lactated ringers infusion   Intravenous Continuous Lorella Nimrod, MD 100 mL/hr at 12/18/19 2129 New Bag at 12/18/19 2129  . latanoprost (XALATAN) 0.005 % ophthalmic solution 1 drop  1 drop Both Eyes QHS Enzo Bi, MD   1 drop at 12/18/19 2123  . timolol (TIMOPTIC) 0.5 % ophthalmic solution 1 drop  1 drop Both Eyes Daily Enzo Bi, MD   1 drop at 12/19/19 1018     Discharge Medications: Please see discharge summary for a list of discharge medications.  Relevant  Imaging Results:  Relevant Lab Results:   Additional Information SSN: [id number]  Miasia Crabtree, Gardiner Rhyme, LCSW

## 2019-12-19 NOTE — Progress Notes (Signed)
OT Cancellation Note  Patient Details Name: ARISHA GERVAIS MRN: [id number] DOB: 12/18/1931   Cancelled Treatment:    Reason Eval/Treat Not Completed: Patient at procedure or test/ unavailable. Upon arrival, pt agreeable to OT tx session, however, transport arrived to take pt to procedure. Will continue to follow POC as available.   Dessie Coma, M.S. OTR/L  12/19/19, 3:16 PM

## 2019-12-19 NOTE — Progress Notes (Signed)
PROGRESS NOTE    [name]  VLX:[id number] DOB: [birth year] DOA: 12/15/2019 PCP: Lynnea Ferrier, MD   Brief Narrative:  Autumn Bradley is a 86 y.o. Caucasian female with medical history significant of systolic CHF, Afib on Eliquis, pleural effusions and pericardial effusion who presented with a myriad of complaints. Basically a failure to thrive and declining health.  There was some concern of some confusion and hallucinations over the past 1 month.  CT chest with multiple nodular opacities bilaterally in lung and liver, concern for metastatic disease with unknown primary.  Subjective: Patient denies any pain.  Still has no appetite, but did eat some breakfast today.  Son and daughter-in-law was at bedside.   Assessment & Plan:   Active Problems:   AMS (altered mental status)   Pulmonary nodules   Failure to thrive (0-17)   DNR (do not resuscitate)   Protein-calorie malnutrition, severe   Poor fluid intake  Altered mental status/failure to thrive/falls.  Appears to be at her baseline now. Per family concern patient is becoming confused and hallucinations at times.  She was alert and oriented x3 with me.  Able to answer all the questions appropriately.  Patient has a progressive decline over the past year.  She recently lost her son due to lymphoma. -Delirium precautions. -PT/OT evaluation-pending SNF placement. -She might be a good candidate for hospice services due to most likely a metastatic disease.  Nodular opacities in lungs and liver Weight loss --concerning for metastatic disease.  Patient and her daughter-in-law was not aware of any prior diagnosis of any type of cancer.  She does not want very aggressive measures for diagnosis or treatment.  Daughter-in-law also wants to involve oncology for a second opinion.  She was also agreeable to see a palliative care for symptom management, as she wants patient to remain comfortable. -CT abdomen and pelvis-with numerous hepatic  masses, advising dedicated liver MRI.  Small amount of free fluid within abdomen and pelvis. -Consult oncology-Dr. Merlene Pulling will review her imaging with IR and will come up some plan for definitive diagnosis.  Patient's family wants diagnosis but might not consider chemotherapy if it is a metastatic disease. -Consult palliative care. -Dr. Meredeth Ide, her pulmonologist was consulted by oncology and they requested a left decubitus chest x-ray.  She is not a good candidate for bronchoscopy.  He is recommending thoracentesis if there is a free-floating fluid. -Ordered MRI liver today as renal function were improved. -PET Scan can be considered as an outpatient.  AKI.  Resolved today with IV fluid. -Continue IV fluid for 1 more day as she will receive more contrast with MRI liver. -Continue to monitor. -Avoid nephrotoxins  Hyponatremia.  It was thought to be due to poor p.o. intake.  See if some IV fluid without any hyponatremia labs.  There is some concern for paraneoplastic syndrome/SIADH.  Sodium at 128 today. -Continue IV fluid. -Continue to monitor sodium. -Check hyponatremia labs-still pending.  Chronic cough.  No leukocytosis, patient remained afebrile.  Procalcitonin negative.  Patient recently finished Z-Pak with no improvement.  There is concern for malignancy.  She was given a dose of ceftriaxone and azithromycin in the ED which was not continued. -Supportive care with cough suppressant.  Bilateral pleural effusions, chronic --L>R.  Left side probably loculated.  Had left thoracentesis back on 07/08/18.   --Pulmonary is recommending repeat thoracentesis if there is a free-floating fluid on decubitus chest x-ray.  Large pericardial effusion, chronic, stable -Known condition in Jan  2020, appeared to be stable since then. -No sign of cardiac tamponade.  Afib on Eliquis -currently rate controlled.   -Holding home dose of Eliquis in case patient decided to go for biopsy.  Hx of chronic  systolic CHF with LVEF 55-20% -BNP 781, but does not appear fluid overloaded.  Only on Lasix 20 mg PRN at home. -Hold diuretic since BP low.  Glaucoma -continue home eye drops  Objective: Vitals:   12/19/19 0422 12/19/19 0812 12/19/19 1213 12/19/19 1214  BP: (!) 94/58 113/73 95/61   Pulse: 68 80 (!) 42 76  Resp: '20 18 16   '$ Temp: 98 F (36.7 C) 98.8 F (37.1 C) 98.1 F (36.7 C)   TempSrc:  Oral    SpO2: 92% 94% 93% 93%  Weight:      Height:        Intake/Output Summary (Last 24 hours) at 12/19/2019 1647 Last data filed at 12/18/2019 1800 Gross per 24 hour  Intake 1200 ml  Output --  Net 1200 ml   Filed Weights   12/15/19 1137  Weight: 56.7 kg    Examination:  General exam: Frail elderly lady with multiple ecchymosis, appears calm and comfortable  Respiratory system: Clear to auscultation. Respiratory effort normal. Cardiovascular system: S1 & S2 heard, RRR. No JVD, murmurs, rubs,  Gastrointestinal system: Soft, nontender, nondistended, bowel sounds positive. Central nervous system: Alert and oriented. No focal neurological deficits. Extremities: No edema, no cyanosis, pulses intact and symmetrical. Psychiatry: Judgement and insight appear normal.   DVT prophylaxis: Lovenox Code Status: DNR Family Communication: Son and daughter-in-law was updated at bedside. Disposition Plan:  Status is: Inpatient  Remains inpatient appropriate because:Ongoing diagnostic testing needed not appropriate for outpatient work up   Dispo: The patient is from: Home              Anticipated d/c is to: SNF with hospice services.              Anticipated d/c date is: 2 days              Patient currently is not medically stable to d/c.  Patient with concern of metastatic disease.  Needs further evaluation.  Palliative care was also consulted as she might qualify for hospice care as she does not want to pursue very aggressive measures.  Family really wants some definitive diagnosis before  discharging.  Consultants:   Oncology  Palliative care  Procedures:  Antimicrobials:   Data Reviewed: I have personally reviewed following labs and imaging studies  CBC: Recent Labs  Lab 12/15/19 1153 12/16/19 0502 12/17/19 0352 12/18/19 0507  WBC 3.7* 3.1* 4.2 3.7*  HGB 16.6* 16.4* 17.0* 15.2*  HCT 46.8* 47.8* 49.2* 42.0  MCV 85.1 87.4 87.1 83.8  PLT 94* 92* 89* 802*   Basic Metabolic Panel: Recent Labs  Lab 12/15/19 1153 12/16/19 0502 12/17/19 0352 12/18/19 0507 12/19/19 0338  NA 127* 129* 127* 126* 128*  K 4.7 4.4 4.9 4.8 4.2  CL 92* 93* 92* 91* 93*  CO2 '26 24 23 25 28  '$ GLUCOSE 149* 113* 111* 117* 110*  BUN 18 16 25* 33* 25*  CREATININE 0.93 0.85 1.01* 1.24* 0.84  CALCIUM 8.5* 8.0* 8.3* 8.6* 8.4*  MG  --  1.8 2.1 2.2  --   PHOS  --   --   --   --  2.5   GFR: Estimated Creatinine Clearance: 43 mL/min (by C-G formula based on SCr of 0.84 mg/dL). Liver Function Tests: Recent  Labs  Lab 12/15/19 1153 12/19/19 0338  AST 70*  --   ALT 27  --   ALKPHOS 72  --   BILITOT 1.6*  --   PROT 5.7*  --   ALBUMIN 3.5 2.7*   No results for input(s): LIPASE, AMYLASE in the last 168 hours. No results for input(s): AMMONIA in the last 168 hours. Coagulation Profile: Recent Labs  Lab 12/17/19 0352  INR 1.3*   Cardiac Enzymes: No results for input(s): CKTOTAL, CKMB, CKMBINDEX, TROPONINI in the last 168 hours. BNP (last 3 results) No results for input(s): PROBNP in the last 8760 hours. HbA1C: No results for input(s): HGBA1C in the last 72 hours. CBG: No results for input(s): GLUCAP in the last 168 hours. Lipid Profile: No results for input(s): CHOL, HDL, LDLCALC, TRIG, CHOLHDL, LDLDIRECT in the last 72 hours. Thyroid Function Tests: Recent Labs    12/17/19 0352 12/18/19 0507  TSH 4.854*  --   FREET4  --  1.32*   Anemia Panel: Recent Labs    12/17/19 0352  VITAMINB12 324  FOLATE 9.4   Sepsis Labs: Recent Labs  Lab 12/15/19 1153 12/15/19 1257    PROCALCITON <0.10  --   LATICACIDVEN  --  1.7    Recent Results (from the past 240 hour(s))  Urine culture     Status: None   Collection Time: 12/15/19 11:53 AM   Specimen: Urine, Random  Result Value Ref Range Status   Specimen Description   Final    URINE, RANDOM Performed at [facility], [address] [place], [place] [address]    Special Requests   Final    NONE Performed at [facility], [address], [place], [place] [address]    Culture   Final    NO GROWTH Performed at [facility], [place] [address], [place], [place] [address]    Report Status 12/16/2019 FINAL  Final  SARS Coronavirus 2 by RT PCR (hospital order, performed in [facility] hospital lab) Nasopharyngeal Nasopharyngeal Swab     Status: None   Collection Time: 12/15/19  2:38 PM   Specimen: Nasopharyngeal Swab  Result Value Ref Range Status   SARS Coronavirus 2 NEGATIVE NEGATIVE Final    Comment: (NOTE) SARS-CoV-2 target nucleic acids are NOT DETECTED.  The SARS-CoV-2 RNA is generally detectable in upper and lower respiratory specimens during the acute phase of infection. The lowest concentration of SARS-CoV-2 viral copies this assay can detect is 250 copies / mL. A negative result does not preclude SARS-CoV-2 infection and should not be used as the sole basis for treatment or other patient management decisions.  A negative result may occur with improper specimen collection / handling, submission of specimen other than nasopharyngeal swab, presence of viral mutation(s) within the areas targeted by this assay, and inadequate number of viral copies (<250 copies / mL). A negative result must be combined with clinical observations, patient history, and epidemiological information.  Fact Sheet for Patients:   StrictlyIdeas.no  Fact Sheet for Healthcare Providers: BankingDealers.co.za  This test is not yet approved or  cleared by  the [place] FDA and has been authorized for detection and/or diagnosis of SARS-CoV-2 by FDA under an Emergency Use Authorization (EUA).  This EUA will remain in effect (meaning this test can be used) for the duration of the COVID-19 declaration under Section 564(b)(1) of the Act, 21 U.S.C. section 360bbb-3(b)(1), unless the authorization is terminated or revoked sooner.  Performed at [facility],  [place], [place] [address]   Blood culture (routine x 2)     Status: None (Preliminary result)   Collection Time: 12/15/19  2:38 PM   Specimen: BLOOD  Result Value Ref Range Status   Specimen Description BLOOD LEFT ANTECUBITAL  Final   Special Requests   Final    BOTTLES DRAWN AEROBIC AND ANAEROBIC Blood Culture adequate volume   Culture   Final    NO GROWTH 4 DAYS Performed at [facility], [address] [place] [address], [place], [place] [address]    Report Status PENDING  Incomplete  Blood culture (routine x 2)     Status: None (Preliminary result)   Collection Time: 12/15/19  2:38 PM   Specimen: BLOOD  Result Value Ref Range Status   Specimen Description BLOOD BLOOD RIGHT ARM  Final   Special Requests   Final    BOTTLES DRAWN AEROBIC AND ANAEROBIC Blood Culture adequate volume   Culture   Final    NO GROWTH 4 DAYS Performed at [facility], [address] [place],  [address]    Report Status PENDING  Incomplete     Radiology Studies: No results found.  Scheduled Meds: . atorvastatin  10 mg Oral QPM  . diltiazem  300 mg Oral QHS  . enoxaparin (LOVENOX) injection  30 mg Subcutaneous Q24H  . feeding supplement (ENSURE ENLIVE)  237 mL Oral BID BM  . latanoprost  1 drop Both Eyes QHS  . timolol  1 drop Both Eyes Daily   Continuous Infusions: . lactated ringers 100 mL/hr at 12/18/19 2129     LOS: 4 days   Time spent: 40 minutes.  Lorella Nimrod, MD [facility]  If 7PM-7AM, please contact  night-coverage [email or web address]  12/19/2019, 4:47 PM   This record has been created using Systems analyst. Errors have been sought and corrected,but may not always be located. Such creation errors do not reflect on the standard of care.

## 2019-12-19 NOTE — TOC Progression Note (Signed)
Transition of Care [facility]) - Progression Note    Patient Details  Name: ANALYNN DAUM MRN: [id number] Date of Birth: 1931/07/24  Transition of [facility]) CM/SW Contact  Debera Sterba, Gardiner Rhyme, LCSW Phone Number: 12/19/2019, 3:21 PM  Clinical Narrative:  Met with pt and [name]-daughter in-law and HCPOA to discuss eventual plan once medically stable. The are looking into rehab once stable due to not in a position to take home after hospitalization due to weakness. Pt is aware and agreeable to this. Family does want a diagnosis so to be able to make a decision regarding treatment. Pt has had both of her vaccinations and will give [name] the SNF list so can look into and have preferences. Will continue to work on plan for pt and touch base on Monday.    Expected Discharge Plan: Home/Self Care Barriers to Discharge: Continued Medical Work up  Expected Discharge Plan and Services Expected Discharge Plan: Home/Self Care In-house Referral: Clinical Social Work     Living arrangements for the past 2 months: Single Family Home                                       Social Determinants of Health (SDOH) Interventions    Readmission Risk Interventions No flowsheet data found.

## 2019-12-19 NOTE — Progress Notes (Addendum)
Date: 12/19/2019,   MRN# [id number] Autumn Bradley Apr 16, 1932 Code Status:     Code Status Orders  (From admission, onward)         Start     Ordered   12/15/19 1646  Do not attempt resuscitation (DNR)  Continuous       Question Answer Comment  In the event of cardiac or respiratory ARREST Do not call a "code blue"   In the event of cardiac or respiratory ARREST Do not perform Intubation, CPR, defibrillation or ACLS   In the event of cardiac or respiratory ARREST Use medication by any route, position, wound care, and other measures to relive pain and suffering. May use oxygen, suction and manual treatment of airway obstruction as needed for comfort.      12/15/19 1645        Code Status History    Date Active Date Inactive Code Status Order ID Comments User Context   07/07/2018 1014 07/11/2018 1736 DNR [id number]  Hillary Bow, MD ED   07/07/2018 0907 07/07/2018 1014 Full Code [id number]  Hillary Bow, MD ED   Advance Care Planning Activity     Hosp day:@LENGTHOFSTAYDAYS @ Referring MD: @ATDPROV @     PCP:      AdmissionWeight: 56.7 kg                 CurrentWeight: 56.7 kg   CC: Abnormal chest ct scan  HPI: This is a pleasant 86 year old lady. DNR. Came in with mental status change, fell hitting face, on workup noted to have no acute cns findings on head ct, bilateral basal nodular infiltrates, left larger than right pleural effusion. liver lesion. Liver lesions present.  MRI pending. Family at present do not want xrt, chemo.Also no invasive procedures it it will set her back. Presently frial and weak. Hx is from family member at the bed side Juliann Pulse).  She coughing frequently, since mother's day. rarely she noted specks of blood, on eliquis ( now on hold since Monday). She has had cipro for uti and a zpak sinnce then.      PMHX:   Past Medical History:  Diagnosis Date  . Atrial fibrillation ([place])   . Cancer [facility])    SKIN CANCER  . Degenerative joint disease   . Glaucoma   .  Hypercholesteremia   . Hypertension    CONTROLLED ON MEDS  . Shortness of breath dyspnea    [place]  . Wears dentures    partial upper and lower   Surgical Hx:  Past Surgical History:  Procedure Laterality Date  . ABDOMINAL HYSTERECTOMY    . CARDIAC CATHETERIZATION  07/2012   [facility] - Neg for blockages  . CARDIAC ELECTROPHYSIOLOGY STUDY AND ABLATION    . CATARACT EXTRACTION W/PHACO Left 03/17/2015   Procedure: CATARACT EXTRACTION PHACO AND INTRAOCULAR LENS PLACEMENT (IOC);  Surgeon: Leandrew Koyanagi, MD;  Location: [place];  Service: Ophthalmology;  Laterality: Left;  . CATARACT EXTRACTION W/PHACO Right 04/14/2015   Procedure: CATARACT EXTRACTION PHACO AND INTRAOCULAR LENS PLACEMENT (IOC);  Surgeon: Leandrew Koyanagi, MD;  Location: [place];  Service: Ophthalmology;  Laterality: Right;  . CHOLECYSTECTOMY    . COLONOSCOPY    . TONSILLECTOMY     Family Hx:  Family History  Problem Relation Age of Onset  . Diabetes Mellitus II Mother   . Heart failure Father   . COPD Sister   . Heart attack Sister   . Heart failure Sister   .  Colon cancer Brother   . Kidney cancer Brother   . Lung cancer Brother    Social Hx:   Social History   Tobacco Use  . Smoking status: Never Smoker  . Smokeless tobacco: Never Used  Substance Use Topics  . Alcohol use: No  . Drug use: Not on file   Medication:    Home Medication:    Current Medication: @CURMEDTAB @   Allergies:  Patient has no known allergies.  Review of Systems: Gen:  Denies  fever, sweats, chills HEENT: Denies blurred vision, double vision, ear pain, eye pain, hearing loss, nose bleeds, sore throat Cvc:  No dizziness, chest pain or heaviness Resp:  Coughing, not really short of breath  Gi: Denies swallowing difficulty, stomach pain, nausea or vomiting, diarrhea, constipation, bowel incontinence Gu:  Denies bladder incontinence, burning urine Ext:   No Joint pain, stiffness or  swelling Skin: No skin rash, easy bruising or bleeding or hives Endoc:  No polyuria, polydipsia , polyphagia or weight change Psych: No depression, insomnia or hallucinations  Other:  All other systems negative  Physical Examination:   VS: BP 95/61 (BP Location: Right Arm)   Pulse 76   Temp 98.1 F (36.7 C)   Resp 16   Ht 5\' 6"  (1.676 m)   Wt 56.7 kg   SpO2 93%   BMI 20.18 kg/m   General Appearance: No distress, facial ecchymotic changes  Neuro: without focal findings, mental status, speech normal, alert and oriented, cranial nerves 2-12 intact, reflexes normal and symmetric, sensation grossly normal  HEENT: PERRLA, EOM intact, no ptosis, no other lesions noticed Pulmonary:No wheezing, No rales left lower chest dullness 1/3 up   Cardiovascular:  Normal S1,S2.  No m/r/g.    Abdomen:Benign, Soft, non-tender, No masses, hepatosplenomegaly, No lymphadenopathy Endoc: No evident thyromegaly, no signs of acromegaly or Cushing features Skin:   warm, no rashes, no ecchymosis  Extremities: normal, no cyanosis, clubbing, no edema, warm with normal capillary refill. Other findings:   Labs results:   Recent Labs    12/17/19 0352 12/18/19 0507 12/19/19 0338  HGB 17.0* 15.2*  --   HCT 49.2* 42.0  --   MCV 87.1 83.8  --   WBC 4.2 3.7*  --   BUN 25* 33* 25*  CREATININE 1.01* 1.24* 0.84  GLUCOSE 111* 117* 110*  CALCIUM 8.3* 8.6* 8.4*  INR 1.3*  --   --   CONTRAST:  69mL OMNIPAQUE IOHEXOL 300 MG/ML  SOLN  COMPARISON:  September 18, 2017.  FINDINGS: Cardiovascular: Atherosclerosis of thoracic aorta is noted without aneurysm or dissection. Large pericardial effusion is again noted as described on prior exam.  Mediastinum/Nodes: 3.0 cm right thyroid nodule is noted which contains multiple calcifications and is unchanged compared to prior exam. The esophagus is unremarkable. No definite adenopathy is noted.  Lungs/Pleura: Moderate and probably loculated left pleural effusion is  again noted. Small right pleural effusion is also noted which does not appear to be significantly changed. No pneumothorax is noted. Multiple rounded ill-defined opacities are noted throughout both lungs, most prominently seen in both lower lobes. These most likely represent multifocal pneumonia, potentially of viral etiology, but metastatic disease cannot be excluded.  Upper Abdomen: There is interval development of several rounded low densities within the liver.  Musculoskeletal: No chest wall abnormality. No acute or significant osseous findings.  IMPRESSION: 1. Multiple rounded ill-defined opacities are noted throughout both lungs, but most prominently seen in the lung bases. These most likely represent multifocal  pneumonia, potentially of viral etiology, but metastatic disease cannot be excluded. Further evaluation with unenhanced chest CT in 2-3 weeks is recommended to ensure resolution or stability. 2. Large pericardial effusion is again noted as described on prior exam. 3. Moderate and probably loculated left pleural effusion is noted which does not appear to be significantly changed. 4. Small right pleural effusion is also noted which does not appear to be significantly changed. 5. Interval development of several rounded low densities within the liver concerning for metastatic disease. MRI of the liver with and without gadolinium administration is recommended for further evaluation. 6. Stable 3.0 cm right thyroid nodule is noted which contains multiple calcifications and is unchanged compared to prior exam. In the setting of significant comorbidities or limited life expectancy, no follow-up recommended. (Ref: J Am Coll Radiol. 2015 Feb;12(2): 143-50).  Aortic Atherosclerosis (ICD10-I70.0).   Electronically Signed   By: Marijo Conception M.D.,    Assessment and Plan: This is an elderly, frail lady, with multiple problems. Pulmonary wise she has; 1. Primarily  basal nodular infiltrates. Ddx;  Metastatic cancer, nodular pneumonia, aspiration, wegners, sarcoid, atypical chf etc  -sputum c/s -ace, anca, sed rate, ra, ana, quantiFeron TB gold -if tissue dx needed consider liver bx if indicated on the liver MRI -at present not a safe candidate for bronchoscopy  2. Left moderate size effusion, small right, ef 25 %. CHF is most likely cause -left lateral decubitus film ? malignant -if free flowing consider diagnostic thoracentesis -following, further orders per above.  3. Pericardial effusion, not in tamponade -consider cardiology consult  4.cough per above -per above -robituson -tessalon perles 100 mg tid       I have personally obtained a history, examined the patient, evaluated laboratory and imaging results, formulated the assessment and plan and placed orders.  The Patient requires high complexity decision making for assessment and support, frequent evaluation and titration of therapies, application of advanced monitoring technologies and extensive interpretation of multiple databases.   Ulas Zuercher,M.D. Pulmonary & Critical care Medicine [facility]

## 2019-12-20 ENCOUNTER — Encounter: Payer: Self-pay | Admitting: Hospitalist

## 2019-12-20 ENCOUNTER — Inpatient Hospital Stay: Payer: Medicare HMO

## 2019-12-20 LAB — CBC
HCT: 46.4 % — ABNORMAL HIGH (ref 36.0–46.0)
Hemoglobin: 16 g/dL — ABNORMAL HIGH (ref 12.0–15.0)
MCH: 30.4 pg (ref 26.0–34.0)
MCHC: 34.5 g/dL (ref 30.0–36.0)
MCV: 88 fL (ref 80.0–100.0)
Platelets: 99 10*3/uL — ABNORMAL LOW (ref 150–400)
RBC: 5.27 MIL/uL — ABNORMAL HIGH (ref 3.87–5.11)
RDW: 14.5 % (ref 11.5–15.5)
WBC: 4.4 10*3/uL (ref 4.0–10.5)
nRBC: 0 % (ref 0.0–0.2)

## 2019-12-20 LAB — BLOOD GAS, ARTERIAL
Acid-base deficit: 1.2 mmol/L (ref 0.0–2.0)
Bicarbonate: 23.6 mmol/L (ref 20.0–28.0)
FIO2: 0.21
O2 Saturation: 87.3 %
Patient temperature: 37
pCO2 arterial: 39 mmHg (ref 32.0–48.0)
pH, Arterial: 7.39 (ref 7.350–7.450)
pO2, Arterial: 54 mmHg — ABNORMAL LOW (ref 83.0–108.0)

## 2019-12-20 LAB — CULTURE, BLOOD (ROUTINE X 2)
Culture: NO GROWTH
Culture: NO GROWTH
Special Requests: ADEQUATE
Special Requests: ADEQUATE

## 2019-12-20 LAB — ANGIOTENSIN CONVERTING ENZYME: Angiotensin-Converting Enzyme: 110 U/L — ABNORMAL HIGH (ref 14–82)

## 2019-12-20 LAB — LACTIC ACID, PLASMA: Lactic Acid, Venous: 2.3 mmol/L (ref 0.5–1.9)

## 2019-12-20 LAB — RHEUMATOID FACTOR: Rheumatoid fact SerPl-aCnc: <10 IU/mL (ref 0.0–13.9)

## 2019-12-20 LAB — FIBRIN DERIVATIVES D-DIMER (ARMC ONLY): Fibrin derivatives D-dimer (ARMC): 933 ng/mL (FEU) — ABNORMAL HIGH (ref 0.00–499.00)

## 2019-12-20 LAB — PROCALCITONIN: Procalcitonin: <0.1 ng/mL

## 2019-12-20 MED ORDER — LEVALBUTEROL HCL 0.63 MG/3ML IN NEBU
0.6300 mg | INHALATION_SOLUTION | Freq: Four times a day (QID) | RESPIRATORY_TRACT | Status: DC | PRN
  Administered 2019-12-20: 0.63 mg via RESPIRATORY_TRACT
  Filled 2019-12-20: qty 3

## 2019-12-20 MED ORDER — LORAZEPAM 2 MG/ML IJ SOLN
0.5000 mg | Freq: Once | INTRAMUSCULAR | Status: AC
  Administered 2019-12-20: 0.5 mg via INTRAVENOUS
  Filled 2019-12-20: qty 1

## 2019-12-20 MED ORDER — LORAZEPAM 2 MG/ML IJ SOLN: 0.5000 mg | Freq: Once | INTRAMUSCULAR | Status: DC

## 2019-12-20 MED ORDER — FUROSEMIDE 10 MG/ML IJ SOLN
20.0000 mg | Freq: Once | INTRAMUSCULAR | Status: AC
  Administered 2019-12-20: 21:00:00 20 mg via INTRAVENOUS
  Filled 2019-12-20: qty 2

## 2019-12-20 NOTE — Progress Notes (Signed)
Assumed care of pt.  Pt transferred from 1A.  Presently, pt is calm, on bipap, oxygen sat 94%.

## 2019-12-20 NOTE — Progress Notes (Signed)
Cross Cover Brief Note Nurse reports patient with suddenonset shortness of breath, tachycardia and decreased oxygen saturations HX- bilateral pleural effusion, pericardial effusion, multiple, likely metastatic, nodules in  Liver and  lung Bedside patient with resp rate 34-40 times a minute, atrial fib on bedside monitor rat 126 and oxygen sats now 90-91% with supplemental oxygen 2.5L [place]. BBS crackles throughout.  Frequent moist cough with yellow sputum production.  Increased confusion/agitation. Can state name and birthdate.  Noticeable struggling and anxious with regards to use of nasal cannula oxygen. Confused to situation.  Continuous reminders to keep oxygen in place and patient states she needs to go see her daughter inl law because she's waiting on her.    RUE with edema at antecubital bicep area  DG chest   IMPRESSION: 1. Mild multifocal patchy infiltrates throughout both lungs. 2. Mild to moderate severity bibasilar infiltrates. 3. Small to moderate size left pleural effusion. 4. Very small right pleural effusion. BNP -  933 CBC - polycythemia, thrombocytopenia 9neith are new find) Procalcitonin  - normal D dimer - 933, down from 984 on 6/16 [place] RUE ABG - notable for hypoxia - 7.39 -  39 - 54 - 23.6 -   Discussed current state and plan with daughter in law over the phone.  Patient was transferred to progresive care and started on bipap therapy with plans for attempted diuresis to bridge till thoracentesis can be done and maybe get to discharge home.  Pateint adamant she wanted to go home.  Patient able to tolerate bipap after low dose ativan given for her restlessness/agitation.,  (prolonged QTc - no antipsychotics)

## 2019-12-20 NOTE — Progress Notes (Signed)
New orders placed

## 2019-12-20 NOTE — Progress Notes (Signed)
Report given to Lattie Haw 2A, family made aware of pts transfer and room number, states understanding

## 2019-12-20 NOTE — Progress Notes (Signed)
Pts emergency contact [name] made aware of episode and change in status, states understanding

## 2019-12-20 NOTE — Progress Notes (Addendum)
Date: 12/20/2019,   MRN# [id number] Autumn Bradley 1931/10/08 Code Status:     Code Status Orders  (From admission, onward)         Start     Ordered   12/15/19 1646  Do not attempt resuscitation (DNR)  Continuous       Question Answer Comment  In the event of cardiac or respiratory ARREST Do not call a "code blue"   In the event of cardiac or respiratory ARREST Do not perform Intubation, CPR, defibrillation or ACLS   In the event of cardiac or respiratory ARREST Use medication by any route, position, wound care, and other measures to relive pain and suffering. May use oxygen, suction and manual treatment of airway obstruction as needed for comfort.      12/15/19 1645        Code Status History    Date Active Date Inactive Code Status Order ID Comments User Context   07/07/2018 1014 07/11/2018 1736 DNR [id number]  Hillary Bow, MD ED   07/07/2018 0907 07/07/2018 1014 Full Code [id number]  Hillary Bow, MD ED   Advance Care Planning Activity     Hosp day:@LENGTHOFSTAYDAYS @ Referring MD: @ATDPROV @     PCP:       HPI: No change since last pm. Had a extensive talk with son. He is looking for secondary causes. Advised him that this, what all the info we have is most likely metastatic cancer. Patient and family want no cancer treatment. Hence bronch and liver bx will be un neccessary and he agreed.  He agreed to thoracentesis, for diagnostic reasons and more so therapeutically.  PMHX:   Past Medical History:  Diagnosis Date  . Atrial fibrillation ([place])   . Cancer [facility])    SKIN CANCER  . Degenerative joint disease   . Glaucoma   . Hypercholesteremia   . Hypertension    CONTROLLED ON MEDS  . Shortness of breath dyspnea    [place]  . Wears dentures    partial upper and lower   Surgical Hx:  Past Surgical History:  Procedure Laterality Date  . ABDOMINAL HYSTERECTOMY    . CARDIAC CATHETERIZATION  07/2012   [facility] - Neg for blockages  . CARDIAC ELECTROPHYSIOLOGY STUDY AND  ABLATION    . CATARACT EXTRACTION W/PHACO Left 03/17/2015   Procedure: CATARACT EXTRACTION PHACO AND INTRAOCULAR LENS PLACEMENT (IOC);  Surgeon: Leandrew Koyanagi, MD;  Location: [place];  Service: Ophthalmology;  Laterality: Left;  . CATARACT EXTRACTION W/PHACO Right 04/14/2015   Procedure: CATARACT EXTRACTION PHACO AND INTRAOCULAR LENS PLACEMENT (IOC);  Surgeon: Leandrew Koyanagi, MD;  Location: [place];  Service: Ophthalmology;  Laterality: Right;  . CHOLECYSTECTOMY    . COLONOSCOPY    . TONSILLECTOMY     Family Hx:  Family History  Problem Relation Age of Onset  . Diabetes Mellitus II Mother   . Heart failure Father   . COPD Sister   . Heart attack Sister   . Heart failure Sister   . Colon cancer Brother   . Kidney cancer Brother   . Lung cancer Brother    Social Hx:   Social History   Tobacco Use  . Smoking status: Never Smoker  . Smokeless tobacco: Never Used  Substance Use Topics  . Alcohol use: No  . Drug use: Not on file   Medication:    Home Medication:    Current Medication: @CURMEDTAB @   Allergies:  Patient has no known allergies.  Review  of Systems: Gen:  Denies  fever, sweats, chills HEENT: Denies blurred vision, double vision, ear pain, eye pain, hearing loss, nose bleeds, sore throat Cvc:  No dizziness, chest pain or heaviness Resp: Minimum shortness of breath, coughing at times   Gi: Denies swallowing difficulty, stomach pain, nausea or vomiting, diarrhea, constipation, bowel incontinence Gu:  Denies bladder incontinence, burning urine Ext:   No Joint pain, stiffness or swelling Skin: No skin rash, easy bruising or bleeding or hives Endoc:  No polyuria, polydipsia , polyphagia or weight change Psych: No depression, insomnia or hallucinations  Other:  All other systems negative  Physical Examination:   VS: BP 101/77 (BP Location: Right Arm)   Pulse 87   Temp 97.8 F (36.6 C) (Oral)   Resp 20   Ht 5\' 6"  (1.676 m)    Wt 56.7 kg   SpO2 93%   BMI 20.18 kg/m   General Appearance: No distress son at bed side Neuro: without focal findings, mental status, speech normal, alert and oriented, cranial nerves 2-12 intact, reflexes normal and symmetric, sensation grossly normal  HEENT: PERRLA, EOM intact, no ptosis, no other lesions noticed, Mallampati: Pulmonary:.No wheezing, No rales  Sputum Production: scat, no blood dullness at lung bases greater on the left  Cardiovascular:  Normal S1,S2.  No m/r/g.      Abdomen:Benign, Soft, non-tender, No masses, hepatosplenomegaly, No lymphadenopathy Endoc: No evident thyromegaly, no signs of acromegaly or Cushing features Skin:   warm, no rashes, no ecchymosis  Extremities: normal, no cyanosis, clubbing, no edema, warm with normal capillary refill. Other findings:   Labs results:   Recent Labs    12/18/19 0507 12/19/19 0338  HGB 15.2*  --   HCT 42.0  --   MCV 83.8  --   WBC 3.7*  --   BUN 33* 25*  CREATININE 1.24* 0.84  GLUCOSE 117* 110*  CALCIUM 8.6* 8.4*  ,    No results for input(s): PH in the last 72 hours.  Invalid input(s): PCO2, PO2, BASEEXCESS, BASEDEFICITE, TFT  Culture results:     Rad results:   DG Chest Left Decubitus  Result Date: 12/19/2019 CLINICAL DATA:  Recurrent left pleural effusion. EXAM: CHEST - LEFT DECUBITUS COMPARISON:  Chest x-ray and chest CT dated 12/15/2019 and CT scan of the abdomen dated 12/16/2019 FINDINGS: Left lateral decubitus view does demonstrate lateral layering of the left pleural effusion. Cardiac silhouette remains enlarged consistent with a large pericardial effusion demonstrated on the prior CT scans. Medial layering of the small right pleural effusion. IMPRESSION: 1. Lateral layering of the left pleural effusion. 2. Medial layering of a small right effusion. Electronically Signed   By: Lorriane Shire M.D.   On: 12/19/2019 17:30   MR LIVER W WO CONTRAST  Result Date: 12/19/2019 CLINICAL DATA:  Liver lesions on  CT. EXAM: MRI ABDOMEN WITHOUT AND WITH CONTRAST TECHNIQUE: Multiplanar multisequence MR imaging of the abdomen was performed both before and after the administration of intravenous contrast. CONTRAST:  57mL GADAVIST GADOBUTROL 1 MMOL/ML IV SOLN COMPARISON:  Abdominal CT 12/16/2019. FINDINGS: Moderate degradation, including extensive respiratory motion, patient arm position, over the anterior abdominal wall. Lower chest: Large pericardial effusion with moderate bilateral pleural effusions. Bibasilar pulmonary nodularity, suboptimally evaluated. Hepatobiliary: No convincing evidence of cirrhosis. Multiple mildly T2 hyperintense, arterially and less so portal venous phase hyperenhancing liver lesions, highly suspicious for metastatic disease. Example in segment 4A at 2.1 cm on 32/12. Segment 7 at 2.1 cm on 30/12. More vague, in  segment 8 at 1.0 cm on 30/12. Cholecystectomy, without biliary ductal dilatation. Pancreas: Grossly normal pancreas, without duct dilatation or dominant mass. Spleen:  Normal in size, without focal abnormality. Adrenals/Urinary Tract: Bilateral mild adrenal nodularity. Upper pole right renal too small to characterize lesion. Normal left kidney. No hydronephrosis. Stomach/Bowel: Grossly normal stomach and abdominal bowel loops. Vascular/Lymphatic: Aortic atherosclerosis. No abdominal adenopathy. Other:  Small volume perihepatic and perisplenic ascites. Musculoskeletal: No acute osseous abnormality. IMPRESSION: 1. Moderately motion and patient position degraded exam. 2. Multiple liver lesions, again suspicious for metastatic disease. 3. No primary malignancy identified within the abdomen. 4. Pleural and pericardial fluid with small volume abdominal ascites, as before. Electronically Signed   By: Abigail Miyamoto M.D.   On: 12/19/2019 14:39      Assessment and Plan: This is an elderly, frail lady, with multiple problems. Pulmonary wise she has; 1. Primarily basal nodular infiltrates with the MRI  findings points mot likely to metastatic cancer and less likely , nodular pneumonia, aspiration, wegners, sarcoid, atypical chf as the primary cause did discuss with her son Ronalee Belts.  at present not a safe candidate for bronchoscopy. He is willing to allow left thoracentesis with the understanding that this is both for diagnostic and therapeutic reasons and there is some risk. I also recommended no liver bx or bronch. Consider Hospice. -left thoracentesis (c/s, cytology, t.prot, ldh, glu, alb, cell count and dif)  2. Left moderate size effusion, small right, ef 25 %. CHF is most likely the cause. Since the left pl effusion is large there may be an additional cause. Pleural effusion is free flowing  -left lateral decubitus film ? malignant  3. Pericardial effusion, not in tamponade -following  4.cough per above -per above -robituson  I have personally obtained a history, examined the patient, evaluated laboratory and imaging results, formulated the assessment and plan and placed orders.  The Patient requires high complexity decision making for assessment and support, frequent evaluation and titration of therapies, application of advanced monitoring technologies and extensive interpretation of multiple databases.   Camron Essman,M.D. Pulmonary & Critical care Medicine [facility]  # [phone]

## 2019-12-20 NOTE — Progress Notes (Signed)
NP in room assessing pt

## 2019-12-20 NOTE — Progress Notes (Signed)
PROGRESS NOTE    Autumn Bradley  BJS:[id number] DOB: Mar 30, 1932 DOA: 12/15/2019 PCP: Adin Hector, MD   Brief Narrative:  Autumn Bradley is a 86 y.o. Caucasian female with medical history significant of systolic CHF, Afib on Eliquis, pleural effusions and pericardial effusion who presented with a myriad of complaints. Basically a failure to thrive and declining health.  There was some concern of some confusion and hallucinations over the past 1 month.  CT chest with multiple nodular opacities bilaterally in lung and liver, concern for metastatic disease with unknown primary.  Subjective: Patient denies any pain.   Assessment & Plan:   Active Problems:   AMS (altered mental status)   Pulmonary nodules   Failure to thrive (0-17)   DNR (do not resuscitate)   Protein-calorie malnutrition, severe   Poor fluid intake  Altered mental status/failure to thrive/falls.  Appears to be at her baseline now. Per family concern patient is becoming confused and hallucinations at times.  She was alert and oriented x3 with me.  Able to answer all the questions appropriately.  Patient has a progressive decline over the past year.  She recently lost her son due to lymphoma. -Delirium precautions. -PT/OT evaluation-pending SNF placement. -She will go to SNF with hospice services.  Nodular opacities in lungs and liver Weight loss --concerning for metastatic disease.  Patient and her daughter-in-law was not aware of any prior diagnosis of any type of cancer.  She does not want very aggressive measures for diagnosis or treatment.  Daughter-in-law also wants to involve oncology for a second opinion.  She was also agreeable to see a palliative care for symptom management, as she wants patient to remain comfortable. -CT abdomen and pelvis-with numerous hepatic masses, advising dedicated liver MRI.  Small amount of free fluid within abdomen and pelvis. -Consult oncology-Dr. Mike Gip will review her imaging  with IR and will come up some plan for definitive diagnosis.  Patient's family wants diagnosis but might not consider chemotherapy if it is a metastatic disease. -Consult palliative care. -Dr. Raul Del, her pulmonologist was consulted by oncology and they requested a left decubitus chest x-ray undecided about thoracentesis after seeing the films.  She is not a good candidate for bronchoscopy.  - MRI liver-with multiple liver lesions highly suspicious for metastatic disease. -PET Scan can be considered as an outpatient.  AKI.  Resolved today with IV fluid. -Continue IV fluid for 1 more day as she will receive more contrast with MRI liver. -Continue to monitor. -Avoid nephrotoxins  Hyponatremia.  It was thought to be due to poor p.o. intake.  See if some IV fluid without any hyponatremia labs.  There is some concern for paraneoplastic syndrome/SIADH.  Sodium at 128 today. -Continue IV fluid. -Continue to monitor sodium. -Check hyponatremia labs-still pending.  Chronic cough.  No leukocytosis, patient remained afebrile.  Procalcitonin negative.  Patient recently finished Z-Pak with no improvement.  There is concern for malignancy.  She was given a dose of ceftriaxone and azithromycin in the ED which was not continued. -Supportive care with cough suppressant.  Bilateral pleural effusions, chronic --L>R.  Left side probably loculated.  Had left thoracentesis back on 07/08/18.   --Pulmonary is recommending repeat thoracentesis if there is a free-floating fluid on decubitus chest x-ray.  Large pericardial effusion, chronic, stable -Known condition in Jan 2020, appeared to be stable since then. -No sign of cardiac tamponade.  Afib on Eliquis -currently rate controlled.   -Holding home dose of Eliquis in  case patient decided to go for biopsy.  Hx of chronic systolic CHF with LVEF 56-21% -BNP 781, but does not appear fluid overloaded.  Only on Lasix 20 mg PRN at home. -Hold diuretic since BP  low.  Glaucoma -continue home eye drops  Objective: Vitals:   12/20/19 0601 12/20/19 0613 12/20/19 0738 12/20/19 1212  BP:   101/77 107/75  Pulse: 98 83 87 85  Resp: (!) 24 (!) 22 20 (!) 24  Temp:   97.8 F (36.6 C) 97.7 F (36.5 C)  TempSrc:   Oral Oral  SpO2: 90% 91% 93% 93%  Weight:      Height:        Intake/Output Summary (Last 24 hours) at 12/20/2019 1520 Last data filed at 12/20/2019 1441 Gross per 24 hour  Intake 1040 ml  Output 650 ml  Net 390 ml   Filed Weights   12/15/19 1137  Weight: 56.7 kg    Examination:  General exam: Frail elderly lady with multiple ecchymosis, appears calm and comfortable  Respiratory system: Clear to auscultation. Respiratory effort normal. Cardiovascular system: S1 & S2 heard, RRR. No JVD, murmurs, rubs,  Gastrointestinal system: Soft, nontender, nondistended, bowel sounds positive. Central nervous system: Alert and oriented. No focal neurological deficits. Extremities: No edema, no cyanosis, pulses intact and symmetrical. Psychiatry: Judgement and insight appear normal.   DVT prophylaxis: Lovenox Code Status: DNR Family Communication: Son and daughter-in-law was updated at bedside. Disposition Plan:  Status is: Inpatient  Remains inpatient appropriate because:Ongoing diagnostic testing needed not appropriate for outpatient work up   Dispo: The patient is from: Home              Anticipated d/c is to: SNF with hospice services.              Anticipated d/c date is: 2 days              Patient currently is medically stable. patient with concern of metastatic disease.   Palliative care was also consulted as she might qualify for hospice care as she does not want to pursue very aggressive measures.  Family really wants some definitive diagnosis before discharging.  Consultants:   Oncology  Palliative care  Procedures:  Antimicrobials:   Data Reviewed: I have personally reviewed following labs and imaging  studies  CBC: Recent Labs  Lab 12/15/19 1153 12/16/19 0502 12/17/19 0352 12/18/19 0507  WBC 3.7* 3.1* 4.2 3.7*  HGB 16.6* 16.4* 17.0* 15.2*  HCT 46.8* 47.8* 49.2* 42.0  MCV 85.1 87.4 87.1 83.8  PLT 94* 92* 89* 308*   Basic Metabolic Panel: Recent Labs  Lab 12/15/19 1153 12/16/19 0502 12/17/19 0352 12/18/19 0507 12/19/19 0338  NA 127* 129* 127* 126* 128*  K 4.7 4.4 4.9 4.8 4.2  CL 92* 93* 92* 91* 93*  CO2 '26 24 23 25 28  '$ GLUCOSE 149* 113* 111* 117* 110*  BUN 18 16 25* 33* 25*  CREATININE 0.93 0.85 1.01* 1.24* 0.84  CALCIUM 8.5* 8.0* 8.3* 8.6* 8.4*  MG  --  1.8 2.1 2.2  --   PHOS  --   --   --   --  2.5   GFR: Estimated Creatinine Clearance: 43 mL/min (by C-G formula based on SCr of 0.84 mg/dL). Liver Function Tests: Recent Labs  Lab 12/15/19 1153 12/19/19 0338  AST 70*  --   ALT 27  --   ALKPHOS 72  --   BILITOT 1.6*  --   PROT 5.7*  --  ALBUMIN 3.5 2.7*   No results for input(s): LIPASE, AMYLASE in the last 168 hours. No results for input(s): AMMONIA in the last 168 hours. Coagulation Profile: Recent Labs  Lab 12/17/19 0352  INR 1.3*   Cardiac Enzymes: No results for input(s): CKTOTAL, CKMB, CKMBINDEX, TROPONINI in the last 168 hours. BNP (last 3 results) No results for input(s): PROBNP in the last 8760 hours. HbA1C: No results for input(s): HGBA1C in the last 72 hours. CBG: No results for input(s): GLUCAP in the last 168 hours. Lipid Profile: No results for input(s): CHOL, HDL, LDLCALC, TRIG, CHOLHDL, LDLDIRECT in the last 72 hours. Thyroid Function Tests: Recent Labs    12/18/19 0507  FREET4 1.32*   Anemia Panel: No results for input(s): VITAMINB12, FOLATE, FERRITIN, TIBC, IRON, RETICCTPCT in the last 72 hours. Sepsis Labs: Recent Labs  Lab 12/15/19 1153 12/15/19 1257  PROCALCITON <0.10  --   LATICACIDVEN  --  1.7    Recent Results (from the past 240 hour(s))  Urine culture     Status: None   Collection Time: 12/15/19 11:53 AM    Specimen: Urine, Random  Result Value Ref Range Status   Specimen Description   Final    URINE, RANDOM Performed at [facility], [address] [place], [place] [address]    Special Requests   Final    NONE Performed at [facility], [address], [place], [place] [address]    Culture   Final    NO GROWTH Performed at [facility], [place] [address], [place], [place] [address]    Report Status 12/16/2019 FINAL  Final  SARS Coronavirus 2 by RT PCR (hospital order, performed in [facility] hospital lab) Nasopharyngeal Nasopharyngeal Swab     Status: None   Collection Time: 12/15/19  2:38 PM   Specimen: Nasopharyngeal Swab  Result Value Ref Range Status   SARS Coronavirus 2 NEGATIVE NEGATIVE Final    Comment: (NOTE) SARS-CoV-2 target nucleic acids are NOT DETECTED.  The SARS-CoV-2 RNA is generally detectable in upper and lower respiratory specimens during the acute phase of infection. The lowest concentration of SARS-CoV-2 viral copies this assay can detect is 250 copies / mL. A negative result does not preclude SARS-CoV-2 infection and should not be used as the sole basis for treatment or other patient management decisions.  A negative result may occur with improper specimen collection / handling, submission of specimen other than nasopharyngeal swab, presence of viral mutation(s) within the areas targeted by this assay, and inadequate number of viral copies (<250 copies / mL). A negative result must be combined with clinical observations, patient history, and epidemiological information.  Fact Sheet for Patients:   StrictlyIdeas.no  Fact Sheet for Healthcare Providers: BankingDealers.co.za  This test is not yet approved or  cleared by the [place] FDA and has been authorized for detection and/or diagnosis of SARS-CoV-2 by FDA under an Emergency Use Authorization (EUA).  This EUA will remain in  effect (meaning this test can be used) for the duration of the COVID-19 declaration under Section 564(b)(1) of the Act, 21 U.S.C. section 360bbb-3(b)(1), unless the authorization is terminated or revoked sooner.  Performed at [facility], [address] [place],  [address]   Blood culture (routine x 2)     Status: None   Collection Time: 12/15/19  2:38 PM   Specimen: BLOOD  Result Value Ref Range Status   Specimen Description BLOOD LEFT ANTECUBITAL  Final   Special  Requests   Final    BOTTLES DRAWN AEROBIC AND ANAEROBIC Blood Culture adequate volume   Culture   Final    NO GROWTH 5 DAYS Performed at [facility], [address] [place], [place] [address]    Report Status 12/20/2019 FINAL  Final  Blood culture (routine x 2)     Status: None   Collection Time: 12/15/19  2:38 PM   Specimen: BLOOD  Result Value Ref Range Status   Specimen Description BLOOD BLOOD RIGHT ARM  Final   Special Requests   Final    BOTTLES DRAWN AEROBIC AND ANAEROBIC Blood Culture adequate volume   Culture   Final    NO GROWTH 5 DAYS Performed at [facility], [address] [place], [place] [address]    Report Status 12/20/2019 FINAL  Final     Radiology Studies: DG Chest Left Decubitus  Result Date: 12/19/2019 CLINICAL DATA:  Recurrent left pleural effusion. EXAM: CHEST - LEFT DECUBITUS COMPARISON:  Chest x-ray and chest CT dated 12/15/2019 and CT scan of the abdomen dated 12/16/2019 FINDINGS: Left lateral decubitus view does demonstrate lateral layering of the left pleural effusion. Cardiac silhouette remains enlarged consistent with a large pericardial effusion demonstrated on the prior CT scans. Medial layering of the small right pleural effusion. IMPRESSION: 1. Lateral layering of the left pleural effusion. 2. Medial layering of a small right effusion. Electronically Signed   By: Francene Boyers M.D.   On: 12/19/2019 17:30   MR LIVER W WO CONTRAST  Result  Date: 12/19/2019 CLINICAL DATA:  Liver lesions on CT. EXAM: MRI ABDOMEN WITHOUT AND WITH CONTRAST TECHNIQUE: Multiplanar multisequence MR imaging of the abdomen was performed both before and after the administration of intravenous contrast. CONTRAST:  46mL GADAVIST GADOBUTROL 1 MMOL/ML IV SOLN COMPARISON:  Abdominal CT 12/16/2019. FINDINGS: Moderate degradation, including extensive respiratory motion, patient arm position, over the anterior abdominal wall. Lower chest: Large pericardial effusion with moderate bilateral pleural effusions. Bibasilar pulmonary nodularity, suboptimally evaluated. Hepatobiliary: No convincing evidence of cirrhosis. Multiple mildly T2 hyperintense, arterially and less so portal venous phase hyperenhancing liver lesions, highly suspicious for metastatic disease. Example in segment 4A at 2.1 cm on 32/12. Segment 7 at 2.1 cm on 30/12. More vague, in segment 8 at 1.0 cm on 30/12. Cholecystectomy, without biliary ductal dilatation. Pancreas: Grossly normal pancreas, without duct dilatation or dominant mass. Spleen:  Normal in size, without focal abnormality. Adrenals/Urinary Tract: Bilateral mild adrenal nodularity. Upper pole right renal too small to characterize lesion. Normal left kidney. No hydronephrosis. Stomach/Bowel: Grossly normal stomach and abdominal bowel loops. Vascular/Lymphatic: Aortic atherosclerosis. No abdominal adenopathy. Other:  Small volume perihepatic and perisplenic ascites. Musculoskeletal: No acute osseous abnormality. IMPRESSION: 1. Moderately motion and patient position degraded exam. 2. Multiple liver lesions, again suspicious for metastatic disease. 3. No primary malignancy identified within the abdomen. 4. Pleural and pericardial fluid with small volume abdominal ascites, as before. Electronically Signed   By: Jeronimo Greaves M.D.   On: 12/19/2019 14:39    Scheduled Meds: . atorvastatin  10 mg Oral QPM  . diltiazem  300 mg Oral QHS  . enoxaparin (LOVENOX)  injection  30 mg Subcutaneous Q24H  . feeding supplement (ENSURE ENLIVE)  237 mL Oral BID BM  . latanoprost  1 drop Both Eyes QHS  . timolol  1 drop Both Eyes Daily   Continuous Infusions: . lactated ringers Stopped (12/19/19 0619)     LOS: 5 days   Time spent: 40  minutes.  Lorella Nimrod, MD [facility]  If 7PM-7AM, please contact night-coverage [email or web address]  12/20/2019, 3:20 PM   This record has been created using Systems analyst. Errors have been sought and corrected,but may not always be located. Such creation errors do not reflect on the standard of care.

## 2019-12-20 NOTE — Progress Notes (Signed)
NP made aware that lab reports lactic 2.3, acknowledged

## 2019-12-20 NOTE — Progress Notes (Signed)
Randol Kern NP made aware that pt had episdoe of SOB during changing the pt, o2 sats low to mid 80s, place on 2.5L [place], o2 sat improved to low 90s, also noted an increase in HR 120s-PO scheduled cardizem given, pt coughing up thick yellow/tan mucus, crackles in lung bases

## 2019-12-20 NOTE — Progress Notes (Signed)
OT Cancellation Note  Patient Details Name: Autumn Bradley MRN: [id number] DOB: 02-16-1932   Cancelled Treatment:    Reason Eval/Treat Not Completed: Other (comment) Pt not available - MD with pt. Will attempt later today if schedule allow   Rosalyn Gess OTR/L,CLT 12/20/2019, 10:08 AM

## 2019-12-20 NOTE — Progress Notes (Signed)
Transferring pt, NP spoke with family

## 2019-12-20 NOTE — Progress Notes (Addendum)
NP made aware that pt refusing to wear bipap, hitting toward staff, pt removing [place], given ativan with minimal effectiveness so far, lungs sounds more wet, lasix given waiting for output-purewick in place

## 2019-12-20 NOTE — Progress Notes (Deleted)
   12/20/19 1300  Clinical Encounter Type  Visited With Patient  Visit Type Initial;Spiritual support;Social support  Referral From Nurse  Consult/Referral To Chaplain  Responded to OR for Pt that needed pray. Pt wanted Ch to pray that antibiotics work on infection, so she does not have to have surgery. Ch prayed for Pt and will follow-up as needed.

## 2019-12-21 ENCOUNTER — Inpatient Hospital Stay
Admit: 2019-12-21 | Discharge: 2019-12-21 | Disposition: A | Discharge status: Home / Self Care | Payer: Medicare HMO | Attending: Internal Medicine | Admitting: Internal Medicine

## 2019-12-21 ENCOUNTER — Inpatient Hospital Stay: Payer: Medicare HMO

## 2019-12-21 LAB — BASIC METABOLIC PANEL
Anion gap: 12 (ref 5–15)
BUN: 24 mg/dL — ABNORMAL HIGH (ref 8–23)
CO2: 23 mmol/L (ref 22–32)
Calcium: 8.8 mg/dL — ABNORMAL LOW (ref 8.9–10.3)
Chloride: 91 mmol/L — ABNORMAL LOW (ref 98–111)
Creatinine, Ser: 0.81 mg/dL (ref 0.44–1.00)
GFR calc Af Amer: >60 mL/min (ref >60)
GFR calc non Af Amer: >60 mL/min (ref >60)
Glucose, Bld: 127 mg/dL — ABNORMAL HIGH (ref 70–99)
Potassium: 4.3 mmol/L (ref 3.5–5.1)
Sodium: 126 mmol/L — ABNORMAL LOW (ref 135–145)

## 2019-12-21 LAB — ANA W/REFLEX: Anti Nuclear Antibody (ANA): POSITIVE — AB

## 2019-12-21 LAB — CBC
HCT: 39.9 % (ref 36.0–46.0)
Hemoglobin: 13.6 g/dL (ref 12.0–15.0)
MCH: 30.1 pg (ref 26.0–34.0)
MCHC: 34.1 g/dL (ref 30.0–36.0)
MCV: 88.3 fL (ref 80.0–100.0)
Platelets: 87 10*3/uL — ABNORMAL LOW (ref 150–400)
RBC: 4.52 MIL/uL (ref 3.87–5.11)
RDW: 14.4 % (ref 11.5–15.5)
WBC: 3 10*3/uL — ABNORMAL LOW (ref 4.0–10.5)
nRBC: 0 % (ref 0.0–0.2)

## 2019-12-21 LAB — ENA+DNA/DS+SJORGEN'S
ENA SM Ab Ser-aCnc: <0.2 AI (ref 0.0–0.9)
Ribonucleic Protein: <0.2 AI (ref 0.0–0.9)
SSA (Ro) (ENA) Antibody, IgG: <0.2 AI (ref 0.0–0.9)
SSB (La) (ENA) Antibody, IgG: 0.2 AI (ref 0.0–0.9)
ds DNA Ab: 40 IU/mL — ABNORMAL HIGH (ref 0–9)

## 2019-12-21 LAB — ECHOCARDIOGRAM COMPLETE
Height: 66 in
Weight: 2299.84 oz

## 2019-12-21 LAB — BRAIN NATRIURETIC PEPTIDE: B Natriuretic Peptide: 1183.2 pg/mL — ABNORMAL HIGH (ref 0.0–100.0)

## 2019-12-21 LAB — LACTIC ACID, PLASMA: Lactic Acid, Venous: 2 mmol/L (ref 0.5–1.9)

## 2019-12-21 MED ORDER — ALBUMIN HUMAN 25 % IV SOLN
25.0000 g | INTRAVENOUS | Status: AC
  Administered 2019-12-21: 25 g via INTRAVENOUS
  Filled 2019-12-21: qty 100

## 2019-12-21 MED ORDER — GLYCOPYRROLATE 0.2 MG/ML IJ SOLN
0.2000 mg | INTRAMUSCULAR | Status: DC | PRN
  Filled 2019-12-21: qty 1

## 2019-12-21 MED ORDER — HALOPERIDOL 0.5 MG PO TABS
0.5000 mg | ORAL_TABLET | ORAL | Status: DC | PRN
  Filled 2019-12-21: qty 1

## 2019-12-21 MED ORDER — SODIUM CHLORIDE 0.9 % IV BOLUS
500.0000 mL | Freq: Once | INTRAVENOUS | Status: AC
  Administered 2019-12-21: 500 mL via INTRAVENOUS

## 2019-12-21 MED ORDER — MORPHINE BOLUS VIA INFUSION
1.0000 mg | INTRAVENOUS | Status: DC | PRN
  Filled 2019-12-21: qty 1

## 2019-12-21 MED ORDER — ONDANSETRON 4 MG PO TBDP
4.0000 mg | ORAL_TABLET | Freq: Four times a day (QID) | ORAL | Status: DC | PRN
  Filled 2019-12-21: qty 1

## 2019-12-21 MED ORDER — ENOXAPARIN SODIUM 40 MG/0.4ML ~~LOC~~ SOLN: 40.0000 mg | SUBCUTANEOUS | Status: DC

## 2019-12-21 MED ORDER — POLYVINYL ALCOHOL 1.4 % OP SOLN
1.0000 [drp] | Freq: Four times a day (QID) | OPHTHALMIC | Status: DC | PRN
  Filled 2019-12-21: qty 15

## 2019-12-21 MED ORDER — ACETAMINOPHEN 325 MG PO TABS
650.0000 mg | ORAL_TABLET | Freq: Four times a day (QID) | ORAL | Status: DC | PRN
  Administered 2019-12-22: 650 mg via ORAL
  Filled 2019-12-21: qty 2

## 2019-12-21 MED ORDER — HALOPERIDOL LACTATE 2 MG/ML PO CONC
0.5000 mg | ORAL | Status: DC | PRN
  Filled 2019-12-21: qty 0.3

## 2019-12-21 MED ORDER — HALOPERIDOL LACTATE 5 MG/ML IJ SOLN: 0.5000 mg | INTRAMUSCULAR | Status: DC | PRN

## 2019-12-21 MED ORDER — MORPHINE 100MG IN NS 100ML (1MG/ML) PREMIX INFUSION
1.0000 mg/h | INTRAVENOUS | Status: DC
  Filled 2019-12-21: qty 100

## 2019-12-21 MED ORDER — FUROSEMIDE 10 MG/ML IJ SOLN
40.0000 mg | Freq: Once | INTRAMUSCULAR | Status: AC
  Administered 2019-12-21: 40 mg via INTRAVENOUS
  Filled 2019-12-21: qty 4

## 2019-12-21 MED ORDER — MORPHINE SULFATE (PF) 2 MG/ML IV SOLN
1.0000 mg | INTRAVENOUS | Status: DC | PRN
  Administered 2019-12-21 – 2019-12-25 (×2): 1 mg via INTRAVENOUS
  Filled 2019-12-21 (×2): qty 1

## 2019-12-21 MED ORDER — GLYCOPYRROLATE 1 MG PO TABS
1.0000 mg | ORAL_TABLET | ORAL | Status: DC | PRN
  Filled 2019-12-21: qty 1

## 2019-12-21 MED ORDER — ACETAMINOPHEN 650 MG RE SUPP: 650.0000 mg | Freq: Four times a day (QID) | RECTAL | Status: DC | PRN

## 2019-12-21 MED ORDER — BIOTENE DRY MOUTH MT LIQD: 15.0000 mL | OROMUCOSAL | Status: DC | PRN

## 2019-12-21 MED ORDER — ONDANSETRON HCL 4 MG/2ML IJ SOLN: 4.0000 mg | Freq: Four times a day (QID) | INTRAMUSCULAR | Status: DC | PRN

## 2019-12-21 NOTE — Progress Notes (Signed)
Called by nursing staff about patient's being bradycardic and hypotensive. Not responding to bolus now.  Patient with very muffled heart sound, heart rate in 40s, unresponsive on BiPAP. Desaturating in 70s on BiPAP.  Discussed with family along with Dr. Saralyn Pilar from cardiology, seems actively dying at this time. Family decided to move her to complete comfort measures. They were requesting her kids to be present when we remove BiPAP.  Comfort measures started. We will remove BiPAP and will support any air hunger with the help of morphine if needed. Comfort measures orders placed.  Anticipating hospital death.

## 2019-12-21 NOTE — Progress Notes (Signed)
   12/21/19 0754  Assess: MEWS Score  Temp (!) 96.1 F (35.6 C)  BP 90/63  Pulse Rate 67  Resp (!) 22  Assess: MEWS Score  MEWS Temp 1  MEWS Systolic 1  MEWS Pulse 0  MEWS RR 1  MEWS LOC 2  MEWS Score 5  MEWS Score Color Red  Assess: if the MEWS score is Yellow or Red  Were vital signs taken at a resting state? Yes  Focused Assessment Documented focused assessment  Early Detection of Sepsis Score *See Row Information* Medium  MEWS guidelines implemented *See Row Information* Yes  Treat  MEWS Interventions Administered scheduled meds/treatments  Take Vital Signs  Increase Vital Sign Frequency  Red: Q 1hr X 4 then Q 4hr X 4, if remains red, continue Q 4hrs  Escalate  MEWS: Escalate Red: discuss with charge nurse/RN and provider, consider discussing with RRT  Notify: Charge Nurse/RN  Name of Charge Nurse/RN Notified Colletta Maryland RN   Date Charge Nurse/RN Notified 12/21/19  Time Charge Nurse/RN Notified 0847  Notify: Provider  Provider Name/Title Dr. Reesa Chew   Date Provider Notified 12/21/19  Time Provider Notified 0840  Notification Type Face-to-face  Notification Reason Change in status  Response See new orders  Date of Provider Response 12/21/19  Time of Provider Response 0911  Document  Patient Outcome Not stable and remains on department  Progress note created (see row info) Yes

## 2019-12-21 NOTE — Consult Note (Signed)
[facility] Cardiology  CARDIOLOGY CONSULT NOTE  Patient ID: Autumn Bradley MRN: [id number] DOB/AGE: [birth year] 86 y.o.  Admit date: 12/15/2019 Referring Physician Reesa Chew Primary Physician [facility] Primary Cardiologist Fath Reason for Consultation pericardial tamponade  HPI: 86 year old female referred for evaluation of pericardial tamponade.  Patient was admitted 12/15/2019 with 84 month history of declining health with multiple complaints with decreased mental status, hallucinations, poor appetite, and weight loss.  She had known moderate to large pericardial effusion and loculated left pleural effusion.  CT in the emergency room revealed multiple nodules both lungs and liver concerning for metastatic cancer.  Patient has known dilated cardiomyopathy, with LVEF of 2025%.  He has chronic atrial fibrillation, on Eliquis for stroke prevention.  The patient's had recent deterioration, with seen hypoxia, hypotension, ECG revealing low voltage all consistent with pericardial tamponade physiology.  Review of systems complete and found to be negative unless listed above     Past Medical History:  Diagnosis Date  . Atrial fibrillation ([place])   . Cancer [facility])    SKIN CANCER  . Degenerative joint disease   . Glaucoma   . Hypercholesteremia   . Hypertension    CONTROLLED ON MEDS  . Shortness of breath dyspnea    [place]  . Wears dentures    partial upper and lower    Past Surgical History:  Procedure Laterality Date  . ABDOMINAL HYSTERECTOMY    . CARDIAC CATHETERIZATION  07/2012   [facility] - Neg for blockages  . CARDIAC ELECTROPHYSIOLOGY STUDY AND ABLATION    . CATARACT EXTRACTION W/PHACO Left 03/17/2015   Procedure: CATARACT EXTRACTION PHACO AND INTRAOCULAR LENS PLACEMENT (IOC);  Surgeon: Leandrew Koyanagi, MD;  Location: [place];  Service: Ophthalmology;  Laterality: Left;  . CATARACT EXTRACTION W/PHACO Right 04/14/2015   Procedure: CATARACT EXTRACTION PHACO AND INTRAOCULAR LENS  PLACEMENT (IOC);  Surgeon: Leandrew Koyanagi, MD;  Location: [place];  Service: Ophthalmology;  Laterality: Right;  . CHOLECYSTECTOMY    . COLONOSCOPY    . TONSILLECTOMY      Medications Prior to Admission  Medication Sig Dispense Refill Last Dose  . apixaban (ELIQUIS) 5 MG TABS tablet Take 5 mg by mouth in the morning and at bedtime.    12/14/2019 at Unknown time  . atorvastatin (LIPITOR) 10 MG tablet Take 10 mg by mouth every evening.    12/14/2019 at Unknown time  . Cholecalciferol (VITAMIN D3) 50 MCG (2000 UT) capsule Take 2,000 Units by mouth daily.      . furosemide (LASIX) 20 MG tablet Take 20 mg by mouth daily as needed for fluid or edema.   Unknown at PRN  . latanoprost (XALATAN) 0.005 % ophthalmic solution Place 1 drop into both eyes at bedtime.    12/14/2019 at Unknown time  . potassium chloride (K-DUR,KLOR-CON) 10 MEQ tablet Take 10 mEq by mouth daily as needed (potassium replacement with Lasix).    Unknown at PRN  . TIADYLT ER 300 MG 24 hr capsule Take 300 mg by mouth at bedtime.   12/14/2019 at Unknown time  . timolol (TIMOPTIC) 0.5 % ophthalmic solution Place 1 drop into both eyes daily.   12/15/2019 at Unknown time  . vitamin B-12 (CYANOCOBALAMIN) 1000 MCG tablet Take 1,000 mcg by mouth daily.      Social History   Socioeconomic History  . Marital status: Widowed    Spouse name: Not on file  . Number of children: Not on file  . Years of education: Not on file  .  Highest education level: Not on file  Occupational History  . Not on file  Tobacco Use  . Smoking status: Never Smoker  . Smokeless tobacco: Never Used  Substance and Sexual Activity  . Alcohol use: No  . Drug use: Not on file  . Sexual activity: Not on file  Other Topics Concern  . Not on file  Social History Narrative  . Not on file   Social Determinants of Health   Financial Resource Strain:   . Difficulty of Paying Living Expenses:   Food Insecurity:   . Worried About Sales executive in the Last Year:   . Arboriculturist in the Last Year:   Transportation Needs:   . Film/video editor (Medical):   Marland Kitchen Lack of Transportation (Non-Medical):   Physical Activity:   . Days of Exercise per Week:   . Minutes of Exercise per Session:   Stress:   . Feeling of Stress :   Social Connections:   . Frequency of Communication with Friends and Family:   . Frequency of Social Gatherings with Friends and Family:   . Attends Religious Services:   . Active Member of Clubs or Organizations:   . Attends Archivist Meetings:   Marland Kitchen Marital Status:   Intimate Partner Violence:   . Fear of Current or Ex-Partner:   . Emotionally Abused:   Marland Kitchen Physically Abused:   . Sexually Abused:     Family History  Problem Relation Age of Onset  . Diabetes Mellitus II Mother   . Heart failure Father   . COPD Sister   . Heart attack Sister   . Heart failure Sister   . Colon cancer Brother   . Kidney cancer Brother   . Lung cancer Brother       Review of systems complete and found to be negative unless listed above      PHYSICAL EXAM  General: Well developed, well nourished, in no acute distress HEENT:  Normocephalic and atramatic Neck:  No JVD.  Lungs: Clear bilaterally to auscultation and percussion. Heart: HRRR . Normal S1 and S2 without gallops or murmurs.  Abdomen: Bowel sounds are positive, abdomen soft and non-tender  Msk:  Back normal, normal gait. Normal strength and tone for age. Extremities: No clubbing, cyanosis or edema.   Neuro: Alert and oriented X 3. Psych:  Good affect, responds appropriately  Labs:   Lab Results  Component Value Date   WBC 3.0 (L) 12/21/2019   HGB 13.6 12/21/2019   HCT 39.9 12/21/2019   MCV 88.3 12/21/2019   PLT 87 (L) 12/21/2019    Recent Labs  Lab 12/15/19 1153 12/16/19 0502 12/21/19 0625  NA 127*   < > 126*  K 4.7   < > 4.3  CL 92*   < > 91*  CO2 26   < > 23  BUN 18   < > 24*  CREATININE 0.93   < > 0.81  CALCIUM  8.5*   < > 8.8*  PROT 5.7*  --   --   BILITOT 1.6*  --   --   ALKPHOS 72  --   --   ALT 27  --   --   AST 70*  --   --   GLUCOSE 149*   < > 127*   < > = values in this interval not displayed.   Lab Results  Component Value Date   TROPONINI 0.18 ([place]) 07/07/2018  No results found for: CHOL No results found for: HDL No results found for: LDLCALC No results found for: TRIG No results found for: CHOLHDL No results found for: LDLDIRECT    Radiology: DG Chest 1 View  Result Date: 12/20/2019 CLINICAL DATA:  Hypoxia. EXAM: CHEST  1 VIEW COMPARISON:  December 19, 2019 FINDINGS: Mild multifocal patchy infiltrates are seen throughout both lungs. Mild to moderate severity infiltrates are also noted within the bilateral lung bases. There is a very small right pleural effusion. A small to moderate size left pleural effusion is seen. No pneumothorax is identified. The cardiac silhouette is markedly enlarged. There is mild calcification of the aortic arch. Degenerative changes seen involving the thoracic spine. IMPRESSION: 1. Mild multifocal patchy infiltrates throughout both lungs. 2. Mild to moderate severity bibasilar infiltrates. 3. Small to moderate size left pleural effusion. 4. Very small right pleural effusion. Electronically Signed   By: Virgina Norfolk M.D.   On: 12/20/2019 21:54   CT Head Wo Contrast  Result Date: 12/15/2019 CLINICAL DATA:  Altered mental status.  Recent fall at home. EXAM: CT HEAD WITHOUT CONTRAST TECHNIQUE: Contiguous axial images were obtained from the base of the skull through the vertex without intravenous contrast. COMPARISON:  12/04/2019 FINDINGS: Brain: Generalized atrophy. No focal finding affects the brainstem or cerebellum. Cerebral hemispheres show chronic small-vessel ischemic changes throughout the white matter. No cortical or large vessel territory infarction. No mass lesion, hemorrhage, hydrocephalus or extra-axial collection. Vascular: There is atherosclerotic  calcification of the major vessels at the base of the brain. Skull: Negative Sinuses/Orbits: Clear/normal Other: None IMPRESSION: No acute or reversible finding. No intracranial hemorrhage. Age related atrophy. Chronic small-vessel ischemic changes of the cerebral hemispheric white matter. Electronically Signed   By: Nelson Chimes M.D.   On: 12/15/2019 13:28   CT HEAD WO CONTRAST  Result Date: 12/04/2019 CLINICAL DATA:  Confusion for 2 weeks. EXAM: CT HEAD WITHOUT CONTRAST TECHNIQUE: Contiguous axial images were obtained from the base of the skull through the vertex without intravenous contrast. COMPARISON:  None. FINDINGS: Brain: Age related atrophy. Moderate chronic small vessel ischemia. No intracranial hemorrhage, mass effect, or midline shift. No hydrocephalus. The basilar cisterns are patent. No evidence of territorial infarct or acute ischemia. No extra-axial or intracranial fluid collection. Vascular: Atherosclerosis of skullbase vasculature without hyperdense vessel or abnormal calcification. Skull: No fracture or focal lesion. Sinuses/Orbits: Mucous retention cyst in the right maxillary sinus. Paranasal sinuses otherwise clear. Mastoid air cells well aerated. The visualized orbits are unremarkable. Bilateral lens extraction. Other: None. IMPRESSION: 1. No acute intracranial abnormality. 2. Age related atrophy and chronic small vessel ischemia. Electronically Signed   By: Keith Rake M.D.   On: 12/04/2019 17:32   CT Chest W Contrast  Result Date: 12/15/2019 CLINICAL DATA:  Persistent cough. EXAM: CT CHEST WITH CONTRAST TECHNIQUE: Multidetector CT imaging of the chest was performed during intravenous contrast administration. CONTRAST:  71mL OMNIPAQUE IOHEXOL 300 MG/ML  SOLN COMPARISON:  September 18, 2017. FINDINGS: Cardiovascular: Atherosclerosis of thoracic aorta is noted without aneurysm or dissection. Large pericardial effusion is again noted as described on prior exam. Mediastinum/Nodes: 3.0 cm  right thyroid nodule is noted which contains multiple calcifications and is unchanged compared to prior exam. The esophagus is unremarkable. No definite adenopathy is noted. Lungs/Pleura: Moderate and probably loculated left pleural effusion is again noted. Small right pleural effusion is also noted which does not appear to be significantly changed. No pneumothorax is noted. Multiple rounded ill-defined opacities are noted  throughout both lungs, most prominently seen in both lower lobes. These most likely represent multifocal pneumonia, potentially of viral etiology, but metastatic disease cannot be excluded. Upper Abdomen: There is interval development of several rounded low densities within the liver. Musculoskeletal: No chest wall abnormality. No acute or significant osseous findings. IMPRESSION: 1. Multiple rounded ill-defined opacities are noted throughout both lungs, but most prominently seen in the lung bases. These most likely represent multifocal pneumonia, potentially of viral etiology, but metastatic disease cannot be excluded. Further evaluation with unenhanced chest CT in 2-3 weeks is recommended to ensure resolution or stability. 2. Large pericardial effusion is again noted as described on prior exam. 3. Moderate and probably loculated left pleural effusion is noted which does not appear to be significantly changed. 4. Small right pleural effusion is also noted which does not appear to be significantly changed. 5. Interval development of several rounded low densities within the liver concerning for metastatic disease. MRI of the liver with and without gadolinium administration is recommended for further evaluation. 6. Stable 3.0 cm right thyroid nodule is noted which contains multiple calcifications and is unchanged compared to prior exam. In the setting of significant comorbidities or limited life expectancy, no follow-up recommended. (Ref: J Am Coll Radiol. 2015 Feb;12(2): 143-50). Aortic  Atherosclerosis (ICD10-I70.0). Electronically Signed   By: Marijo Conception M.D.   On: 12/15/2019 13:30   DG Chest Left Decubitus  Result Date: 12/19/2019 CLINICAL DATA:  Recurrent left pleural effusion. EXAM: CHEST - LEFT DECUBITUS COMPARISON:  Chest x-ray and chest CT dated 12/15/2019 and CT scan of the abdomen dated 12/16/2019 FINDINGS: Left lateral decubitus view does demonstrate lateral layering of the left pleural effusion. Cardiac silhouette remains enlarged consistent with a large pericardial effusion demonstrated on the prior CT scans. Medial layering of the small right pleural effusion. IMPRESSION: 1. Lateral layering of the left pleural effusion. 2. Medial layering of a small right effusion. Electronically Signed   By: Lorriane Shire M.D.   On: 12/19/2019 17:30   CT ABDOMEN PELVIS W CONTRAST  Result Date: 12/16/2019 CLINICAL DATA:  Fair to thrive, multiple liver lesions seen on chest CT, concern for metastatic disease EXAM: CT ABDOMEN AND PELVIS WITH CONTRAST TECHNIQUE: Multidetector CT imaging of the abdomen and pelvis was performed using the standard protocol following bolus administration of intravenous contrast. CONTRAST:  46mL OMNIPAQUE IOHEXOL 300 MG/ML  SOLN COMPARISON:  12/15/2019 FINDINGS: Lower chest: Large pericardial effusion again noted unchanged. Bilateral pleural effusions are stable. Nodular consolidation at the lung bases consistent with suspected metastatic disease. Hepatobiliary: There are numerous hepatic masses, largest within segment 4 measuring 1.8 cm. Again, dedicated liver MRI recommended when clinically able for complete characterization. On this single phase examination, metastatic disease remains the diagnosis of exclusion. No intrahepatic duct dilation. Gallbladder is surgically absent. Pancreas: Unremarkable. No pancreatic ductal dilatation or surrounding inflammatory changes. Spleen: 8 mm hyperdensity within the posterior aspect of the spleen nonspecific, and may  reflect small hemangioma. Otherwise the spleen is unremarkable. Adrenals/Urinary Tract: Bilateral adrenal thickening is nonspecific, metastatic disease not excluded given remaining findings. No change since prior study. There is bilateral renal cortical atrophy. No focal renal abnormalities. No urinary tract calculi or obstruction. Bladder is decompressed but otherwise unremarkable. Stomach/Bowel: No bowel obstruction or ileus. There is diffuse diverticulosis of the sigmoid colon without evidence of diverticulitis. No bowel wall thickening or inflammatory change. Vascular/Lymphatic: Aortic atherosclerosis. No enlarged abdominal or pelvic lymph nodes. Reproductive: Status post hysterectomy. No adnexal masses. Other: Small  amount of free fluid within the abdomen and pelvis. No free intraperitoneal gas. No abdominal wall hernia. Musculoskeletal: No acute or destructive bony lesions. Reconstructed images demonstrate no additional findings. IMPRESSION: 1. Numerous hepatic masses, largest within segment 4 measuring 1.8 cm. Again, dedicated liver MRI recommended when clinically able for complete characterization. On this single phase examination, metastatic disease remains the diagnosis of exclusion. 2. Nodular consolidation at the lung bases consistent with metastatic disease. 3. Large pericardial effusion and bilateral pleural effusions, stable. 4. Small amount of free fluid within the abdomen and pelvis. 5. Sigmoid diverticulosis without diverticulitis. 6. Aortic Atherosclerosis (ICD10-I70.0). Electronically Signed   By: Randa Ngo M.D.   On: 12/16/2019 19:47   MR LIVER W WO CONTRAST  Result Date: 12/19/2019 CLINICAL DATA:  Liver lesions on CT. EXAM: MRI ABDOMEN WITHOUT AND WITH CONTRAST TECHNIQUE: Multiplanar multisequence MR imaging of the abdomen was performed both before and after the administration of intravenous contrast. CONTRAST:  91mL GADAVIST GADOBUTROL 1 MMOL/ML IV SOLN COMPARISON:  Abdominal CT  12/16/2019. FINDINGS: Moderate degradation, including extensive respiratory motion, patient arm position, over the anterior abdominal wall. Lower chest: Large pericardial effusion with moderate bilateral pleural effusions. Bibasilar pulmonary nodularity, suboptimally evaluated. Hepatobiliary: No convincing evidence of cirrhosis. Multiple mildly T2 hyperintense, arterially and less so portal venous phase hyperenhancing liver lesions, highly suspicious for metastatic disease. Example in segment 4A at 2.1 cm on 32/12. Segment 7 at 2.1 cm on 30/12. More vague, in segment 8 at 1.0 cm on 30/12. Cholecystectomy, without biliary ductal dilatation. Pancreas: Grossly normal pancreas, without duct dilatation or dominant mass. Spleen:  Normal in size, without focal abnormality. Adrenals/Urinary Tract: Bilateral mild adrenal nodularity. Upper pole right renal too small to characterize lesion. Normal left kidney. No hydronephrosis. Stomach/Bowel: Grossly normal stomach and abdominal bowel loops. Vascular/Lymphatic: Aortic atherosclerosis. No abdominal adenopathy. Other:  Small volume perihepatic and perisplenic ascites. Musculoskeletal: No acute osseous abnormality. IMPRESSION: 1. Moderately motion and patient position degraded exam. 2. Multiple liver lesions, again suspicious for metastatic disease. 3. No primary malignancy identified within the abdomen. 4. Pleural and pericardial fluid with small volume abdominal ascites, as before. Electronically Signed   By: Abigail Miyamoto M.D.   On: 12/19/2019 14:39   US Venous Img Upper Uni Right(DVT)  Result Date: 12/21/2019 CLINICAL DATA:  Right upper extremity edema. EXAM: RIGHT UPPER EXTREMITY VENOUS DOPPLER ULTRASOUND TECHNIQUE: Gray-scale sonography with graded compression, as well as color Doppler and duplex ultrasound were performed to evaluate the upper extremity deep venous system from the level of the subclavian vein and including the jugular, axillary, basilic, radial, ulnar  and upper cephalic vein. Spectral Doppler was utilized to evaluate flow at rest and with distal augmentation maneuvers. COMPARISON:  None. FINDINGS: Contralateral Subclavian Vein: Respiratory phasicity is normal and symmetric with the symptomatic side. No evidence of thrombus. Normal compressibility. Internal Jugular Vein: No evidence of thrombus. Normal compressibility, respiratory phasicity and response to augmentation. Subclavian Vein: No evidence of thrombus. Normal compressibility, respiratory phasicity and response to augmentation. Axillary Vein: No evidence of thrombus. Normal compressibility, respiratory phasicity and response to augmentation. Cephalic Vein: No evidence of thrombus. Normal compressibility, respiratory phasicity and response to augmentation. Basilic Vein: No evidence of thrombus. Normal compressibility, respiratory phasicity and response to augmentation. Brachial Veins: No evidence of thrombus. Normal compressibility, respiratory phasicity and response to augmentation. Radial Veins: No evidence of thrombus. Normal compressibility, respiratory phasicity and response to augmentation. Ulnar Veins: No evidence of thrombus. Normal compressibility, respiratory phasicity and response to  augmentation. Venous Reflux:  None visualized. Other Findings:  None visualized. IMPRESSION: No evidence of DVT within the right upper extremity. Electronically Signed   By: Marin Olp M.D.   On: 12/21/2019 08:49   DG Chest Port 1 View  Result Date: 12/15/2019 CLINICAL DATA:  Altered mental status. Weakness. Recent fall. Pericardial effusion. EXAM: PORTABLE CHEST 1 VIEW COMPARISON:  07/08/2018, and chest CT on 09/18/2017 FINDINGS: Marked cardiac enlargement again seen, consistent with large pericardial effusion seen on prior CT. Aortic atherosclerosis noted. Small left pleural effusion or pleural thickening remains stable. Several ill-defined nodular opacities are seen in the left lung which are new since  previous study. Pulmonary metastases cannot be excluded. Right lung is clear. IMPRESSION: 1. New ill-defined nodular opacities in left lung. Pulmonary metastases cannot be excluded. Consider further evaluation with chest CT with contrast. 2. Stable marked cardiac enlargement, consistent with large pericardial effusion. 3. Stable small left pleural effusion versus pleural thickening. Electronically Signed   By: Marlaine Hind M.D.   On: 12/15/2019 12:11    EKG: Fibrillation with low voltage  ASSESSMENT AND PLAN:   1.  Pericardial tamponade.  Patient has known history of moderate to large pericardial effusion, with recent deterioration with hypoxia, hypotension, with ECG revealing low QRS voltage, all consistent with pericardial tamponade. 2.  Loculated left pleural effusion, awaiting thoracentesis 3.  Dilated cardiomyopathy, with LVEF 2025% 4.  Multiple lung and liver masses consistent with metastatic cancer 5.  Decreased mental status 6.  Failure to thrive x1 month with poor p.o. intake and weight loss  Recommendations  1.  Agree with current therapy 2.  Urgent 2D echocardiogram 3.  Based on 2D echocardiogram results will likely proceed with pericardiocentesis.  I discussed in detail the risk, benefits and alternatives of pericardiocentesis with the patient's son wishes to proceed with pericardiocentesis.  Informed signed consent was obtained.  Signed: Isaias Cowman MD,PhD, [facility] 12/21/2019, 9:58 AM

## 2019-12-21 NOTE — Progress Notes (Signed)
Family arrived-  Gave patient dose of morphine, then took off Bipap.  Family requested place patient on O2.   Placed on 7L.    Will continue to monitor

## 2019-12-21 NOTE — Progress Notes (Signed)
PHARMACIST - PHYSICIAN COMMUNICATION  CONCERNING:  Enoxaparin (Lovenox) for DVT Prophylaxis    RECOMMENDATION: Patient was prescribed enoxaparin 30 mg q24 hours for VTE prophylaxis.   Filed Weights   12/15/19 1137 12/20/19 2314 12/21/19 0435  Weight: 56.7 kg (125 lb) 64.3 kg (141 lb 12.1 oz) 65.2 kg (143 lb 11.8 oz)    Body mass index is 23.2 kg/m.  Estimated Creatinine Clearance: 46.7 mL/min (by C-G formula based on SCr of 0.81 mg/dL).   Based on [place] patient is candidate for enoxaparin 40 mg every 24 hours based on CrCl > 54ml/min  DESCRIPTION: Pharmacy has adjusted enoxaparin dose per [facility] policy.   Patient is now receiving enoxaparin 40 mg every 24 hours.  Tawnya Crook, PharmD Clinical Pharmacist  12/21/2019 11:22 AM

## 2019-12-21 NOTE — Progress Notes (Signed)
[facility] Hematology/Oncology Progress Note  Date of admission: 12/15/2019  Hospital day:  12/21/2019  Chief Complaint: Autumn Bradley is a 86 y.o. female with a history of CHF, pleural and pericardial effusions who was admitted through the ER with altered mental status, hyponatremia, weight loss, and nodular opacities in the lungs.  Subjective:  The patient acutely deteriorated (bradycardia, hypotension, and desaturation) and was placed on BiPAP.  Comfort care was initiated with oxygen and a morphine drip.  Subsequently, patient sitting up and interacting with family on [place] only.  She has eaten some.  She currently denies any complaint.  Social History: The patient is accompanied by her daughter-in-law, [name], son, Ronalee Belts, and another son today.  Allergies: No Known Allergies  Scheduled Medications: . feeding supplement (ENSURE ENLIVE)  237 mL Oral BID BM  . latanoprost  1 drop Both Eyes QHS  . LORazepam  0.5 mg Intravenous Once  . timolol  1 drop Both Eyes Daily    Review of Systems: GENERAL:  General fatigue.  No fevers, sweats.  Weight loss of 80 pounds in 2 years (12 pounds since last admission). PERFORMANCE STATUS (ECOG):  3-4 HEENT:  No visual changes, runny nose, sore throat, mouth sores or tenderness. Lungs:  Patient currently denies shortness of breath.  Cardiac:  CHF.  Atrial fibrillation.  Pericardial effusion stable per family.  No chest pain. GI:  Eating some.  No nausea, vomiting, diarrhea, constipation, melena or hematochezia. GU:  Catheter.  No urgency, frequency, dysuria, or hematuria. Musculoskeletal:  General weakness.  No focal pain. Extremities:  No pain or swelling. Skin:  No rashes or skin changes. Neuro:  Family notes patient back to herself.  No headache, focal numbness or weakness. Endocrine:  No diabetes, thyroid issues, hot flashes or night sweats. Psych:  No mood changes, depression or anxiety. Pain:  No focal pain. Review of  systems:  All other systems reviewed and found to be negative.  Physical Exam: Blood pressure (!) 84/50, pulse (!) 56, temperature (!) 96.1 F (35.6 C), temperature source Axillary, resp. rate 18, height 5\' 6"  (1.676 m), weight 143 lb 11.8 oz (65.2 kg), SpO2 98 %.  GENERAL:  Chronically fatigued appeared elderly woman sitting up comfortably in no acute distress. MENTAL STATUS:  Alert and oriented to person and place.Marland Kitchen HEAD:  Short white hair.  Normocephalic, atraumatic, face symmetric, no Cushingoid features. EYES:  Pupils equal round and reactive to light and accomodation.  No conjunctivitis or scleral icterus. ENT:  Oropharynx clear without lesion.  Tongue normal. Mucous membranes moist.  RESPIRATORY:  Decreased breath sounds left lower lobe.  No rales, wheezes or rhonchi. CARDIOVASCULAR:  Distant heart sounds.  Irregular rhythm. No murmur, rub or gallop. ABDOMEN:  Soft, non-tender, with decreased bowel sounds, and no hepatosplenomegaly.  No masses. SKIN:  Ecchymosis left side of face and leg, fading.  No rashes, ulcers or lesions. EXTREMITIES: No edema or tenderness.  No palpable cords. NEUROLOGICAL: Talkative.  Engaging.   Results for orders placed or performed during the hospital encounter of 12/15/19 (from the past 48 hour(s))  Osmolality, urine     Status: None   Collection Time: 12/19/19 10:00 PM  Result Value Ref Range   Osmolality, Ur 785 300 - 900 mOsm/kg    Comment: Performed at [facility], [address] [place], [place] [address]  Sodium, urine, random     Status: None   Collection Time: 12/19/19 10:00 PM  Result Value Ref Range  Sodium, Ur 31 mmol/L    Comment: Performed at [facility], [address] [place], [place] [address]  Brain natriuretic peptide     Status: Abnormal   Collection Time: 12/20/19  9:11 PM  Result Value Ref Range   B Natriuretic Peptide 1,183.2 (H) 0.0 - 100.0 pg/mL    Comment: Performed at [facility], [address] [place], [place] [address]  Procalcitonin - Baseline     Status: None   Collection Time: 12/20/19  9:11 PM  Result Value Ref Range   Procalcitonin <0.10 ng/mL    Comment:        Interpretation: PCT (Procalcitonin) <= 0.5 ng/mL: Systemic infection (sepsis) is not likely. Local bacterial infection is possible. (NOTE)       Sepsis PCT Algorithm           Lower Respiratory Tract                                      Infection PCT Algorithm    ----------------------------     ----------------------------         PCT < 0.25 ng/mL                PCT < 0.10 ng/mL          Strongly encourage             Strongly discourage   discontinuation of antibiotics    initiation of antibiotics    ----------------------------     -----------------------------       PCT 0.25 - 0.50 ng/mL            PCT 0.10 - 0.25 ng/mL               OR       >80% decrease in PCT            Discourage initiation of                                            antibiotics      Encourage discontinuation           of antibiotics    ----------------------------     -----------------------------         PCT >= 0.50 ng/mL              PCT 0.26 - 0.50 ng/mL               AND        <80% decrease in PCT             Encourage initiation of                                             antibiotics       Encourage continuation           of antibiotics    ----------------------------     -----------------------------        PCT >= 0.50 ng/mL                  PCT > 0.50 ng/mL  AND         increase in PCT                  Strongly encourage                                      initiation of antibiotics    Strongly encourage escalation           of antibiotics                                     -----------------------------                                           PCT <= 0.25 ng/mL                                                 OR                                        > 80% decrease in PCT                                       Discontinue / Do not initiate                                             antibiotics  Performed at [facility], [address] [place], [place] [address]   Lactic acid, plasma     Status: Abnormal   Collection Time: 12/20/19  9:11 PM  Result Value Ref Range   Lactic Acid, Venous 2.3 (HH) 0.5 - 1.9 mmol/L    Comment: CRITICAL RESULT CALLED TO, READ BACK BY AND VERIFIED WITH AMANDA PEREZ ON 12/20/19 AT 2225 [facility] Performed at [facility], [address] [place], [place] [address]   CBC     Status: Abnormal   Collection Time: 12/20/19  9:11 PM  Result Value Ref Range   WBC 4.4 4.0 - 10.5 K/uL   RBC 5.27 (H) 3.87 - 5.11 MIL/uL   Hemoglobin 16.0 (H) 12.0 - 15.0 g/dL   HCT 46.4 (H) 36 - 46 %   MCV 88.0 80.0 - 100.0 fL   MCH 30.4 26.0 - 34.0 pg   MCHC 34.5 30.0 - 36.0 g/dL   RDW 14.5 11.5 - 15.5 %   Platelets 99 (L) 150 - 400 K/uL    Comment: Immature Platelet Fraction may be clinically indicated, consider ordering this additional test [id number]    nRBC 0.0 0.0 - 0.2 %    Comment: Performed at [facility], [address] [place], [place] [address]  Fibrin derivatives D-Dimer [facility] only)     Status: Abnormal   Collection Time: 12/20/19  9:11 PM  Result Value Ref Range  Fibrin derivatives D-dimer ([facility]) 933.00 (H) 0.00 - 499.00 ng/mL (FEU)    Comment: (NOTE) <> Exclusion of Venous Thromboembolism (VTE) - OUTPATIENT ONLY   (Emergency Department or [place])    0-499 ng/ml (FEU): With a low to intermediate pretest probability                      for VTE this test result excludes the diagnosis                      of VTE.   >499 ng/ml (FEU) : VTE not excluded; additional work up for VTE is                      required.  <> Testing on Inpatients and Evaluation of Disseminated Intravascular   Coagulation (DIC) Reference Range:   0-499 ng/ml (FEU) Performed at [facility], [address] [place], [place]  [address]   Blood gas, arterial     Status: Abnormal   Collection Time: 12/20/19  9:15 PM  Result Value Ref Range   FIO2 0.21    pH, Arterial 7.39 7.35 - 7.45   pCO2 arterial 39 32 - 48 mmHg   pO2, Arterial 54 (L) 83 - 108 mmHg   Bicarbonate 23.6 20.0 - 28.0 mmol/L   Acid-base deficit 1.2 0.0 - 2.0 mmol/L   O2 Saturation 87.3 %   Patient temperature 37.0    Collection site LEFT RADIAL    Sample type ARTERIAL DRAW    Allens test (pass/fail) PASS PASS    Comment: Performed at [facility], [address] [place], [place] [address]  Lactic acid, plasma     Status: Abnormal   Collection Time: 12/20/19 11:48 PM  Result Value Ref Range   Lactic Acid, Venous 2.0 (HH) 0.5 - 1.9 mmol/L    Comment: CRITICAL VALUE NOTED. VALUE IS CONSISTENT WITH PREVIOUSLY REPORTED/CALLED VALUE [facility] Performed at [facility], [address] [place], [place] [address]   Basic metabolic panel     Status: Abnormal   Collection Time: 12/21/19  6:25 AM  Result Value Ref Range   Sodium 126 (L) 135 - 145 mmol/L   Potassium 4.3 3.5 - 5.1 mmol/L   Chloride 91 (L) 98 - 111 mmol/L   CO2 23 22 - 32 mmol/L   Glucose, Bld 127 (H) 70 - 99 mg/dL    Comment: Glucose reference range applies only to samples taken after fasting for at least 8 hours.   BUN 24 (H) 8 - 23 mg/dL   Creatinine, Ser 0.81 0.44 - 1.00 mg/dL   Calcium 8.8 (L) 8.9 - 10.3 mg/dL   GFR calc non Af Amer >60 >60 mL/min   GFR calc Af Amer >60 >60 mL/min   Anion gap 12 5 - 15    Comment: Performed at [facility], [address] [place], [place] [address]  CBC     Status: Abnormal   Collection Time: 12/21/19  6:25 AM  Result Value Ref Range   WBC 3.0 (L) 4.0 - 10.5 K/uL   RBC 4.52 3.87 - 5.11 MIL/uL   Hemoglobin 13.6 12.0 - 15.0 g/dL   HCT 39.9 36 - 46 %   MCV 88.3 80.0 - 100.0 fL   MCH 30.1 26.0 - 34.0 pg   MCHC 34.1 30.0 - 36.0 g/dL   RDW 14.4 11.5 - 15.5 %   Platelets 87 (L) 150 - 400  K/uL    Comment: Immature  Platelet Fraction may be clinically indicated, consider ordering this additional test [id number]    nRBC 0.0 0.0 - 0.2 %    Comment: Performed at [facility], [address] [place], [place] [address]   DG Chest 1 View  Result Date: 12/20/2019 CLINICAL DATA:  Hypoxia. EXAM: CHEST  1 VIEW COMPARISON:  December 19, 2019 FINDINGS: Mild multifocal patchy infiltrates are seen throughout both lungs. Mild to moderate severity infiltrates are also noted within the bilateral lung bases. There is a very small right pleural effusion. A small to moderate size left pleural effusion is seen. No pneumothorax is identified. The cardiac silhouette is markedly enlarged. There is mild calcification of the aortic arch. Degenerative changes seen involving the thoracic spine. IMPRESSION: 1. Mild multifocal patchy infiltrates throughout both lungs. 2. Mild to moderate severity bibasilar infiltrates. 3. Small to moderate size left pleural effusion. 4. Very small right pleural effusion. Electronically Signed   By: Virgina Norfolk M.D.   On: 12/20/2019 21:54   US Venous Img Upper Uni Right(DVT)  Result Date: 12/21/2019 CLINICAL DATA:  Right upper extremity edema. EXAM: RIGHT UPPER EXTREMITY VENOUS DOPPLER ULTRASOUND TECHNIQUE: Gray-scale sonography with graded compression, as well as color Doppler and duplex ultrasound were performed to evaluate the upper extremity deep venous system from the level of the subclavian vein and including the jugular, axillary, basilic, radial, ulnar and upper cephalic vein. Spectral Doppler was utilized to evaluate flow at rest and with distal augmentation maneuvers. COMPARISON:  None. FINDINGS: Contralateral Subclavian Vein: Respiratory phasicity is normal and symmetric with the symptomatic side. No evidence of thrombus. Normal compressibility. Internal Jugular Vein: No evidence of thrombus. Normal compressibility, respiratory phasicity and response to augmentation. Subclavian Vein: No  evidence of thrombus. Normal compressibility, respiratory phasicity and response to augmentation. Axillary Vein: No evidence of thrombus. Normal compressibility, respiratory phasicity and response to augmentation. Cephalic Vein: No evidence of thrombus. Normal compressibility, respiratory phasicity and response to augmentation. Basilic Vein: No evidence of thrombus. Normal compressibility, respiratory phasicity and response to augmentation. Brachial Veins: No evidence of thrombus. Normal compressibility, respiratory phasicity and response to augmentation. Radial Veins: No evidence of thrombus. Normal compressibility, respiratory phasicity and response to augmentation. Ulnar Veins: No evidence of thrombus. Normal compressibility, respiratory phasicity and response to augmentation. Venous Reflux:  None visualized. Other Findings:  None visualized. IMPRESSION: No evidence of DVT within the right upper extremity. Electronically Signed   By: Marin Olp M.D.   On: 12/21/2019 08:49   ECHOCARDIOGRAM COMPLETE  Result Date: 12/21/2019    ECHOCARDIOGRAM REPORT   Patient Name:   LAWANDA HOLZHEIMER Date of Exam: 12/21/2019 Medical Rec #:  [id number]      Height:       66.0 in Accession #:    [id number]     Weight:       143.7 lb Date of Birth:  [birth year]       BSA:          1.738 m Patient Age:    50 years       BP:           90/63 mmHg Patient Gender: F              HR:           67 bpm. Exam Location:  [facility] Procedure: 2D Echo STAT ECHO Indications:     R/O Cardiac Tamponade  History:  Patient has prior history of Echocardiogram examinations.                  Pericardial Effusion, CA, Arrythmias:Atrial Fibrillation; Risk                  Factors:Hypertension.  Sonographer:     L Thornton-Maynard Referring Phys:  [id number] AMIN Diagnosing Phys: Isaias Cowman MD IMPRESSIONS  1. Left ventricular ejection fraction, by estimation, is 25 to 30%. The left ventricle has severely decreased function. The left  ventricle has no regional wall motion abnormalities. There is moderate left ventricular hypertrophy. Left ventricular diastolic parameters are indeterminate.  2. Right ventricular systolic function is normal. The right ventricular size is normal. There is normal pulmonary artery systolic pressure.  3. Large pericardial effusion. The pericardial effusion is circumferential. There is no evidence of cardiac tamponade. Moderate pleural effusion in the left lateral region.  4. The mitral valve is normal in structure. Moderate to severe mitral valve regurgitation. No evidence of mitral stenosis.  5. Tricuspid valve regurgitation is mild to moderate.  6. The aortic valve is normal in structure. Aortic valve regurgitation is not visualized. No aortic stenosis is present.  7. The inferior vena cava is normal in size with greater than 50% respiratory variability, suggesting right atrial pressure of 3 mmHg. FINDINGS  Left Ventricle: Left ventricular ejection fraction, by estimation, is 25 to 30%. The left ventricle has severely decreased function. The left ventricle has no regional wall motion abnormalities. The left ventricular internal cavity size was normal in size. There is moderate left ventricular hypertrophy. Left ventricular diastolic parameters are indeterminate. Right Ventricle: The right ventricular size is normal. No increase in right ventricular wall thickness. Right ventricular systolic function is normal. There is normal pulmonary artery systolic pressure. The tricuspid regurgitant velocity is 2.40 m/s, and  with an assumed right atrial pressure of 10 mmHg, the estimated right ventricular systolic pressure is 46.9 mmHg. Left Atrium: Left atrial size was normal in size. Right Atrium: Right atrial size was normal in size. Pericardium: A large pericardial effusion is present. The pericardial effusion is circumferential. There is no evidence of cardiac tamponade. Mitral Valve: The mitral valve is normal in  structure. Normal mobility of the mitral valve leaflets. Moderate to severe mitral valve regurgitation. No evidence of mitral valve stenosis. MV peak gradient, 3.4 mmHg. The mean mitral valve gradient is 2.0 mmHg. Tricuspid Valve: The tricuspid valve is normal in structure. Tricuspid valve regurgitation is mild to moderate. No evidence of tricuspid stenosis. Aortic Valve: The aortic valve is normal in structure. Aortic valve regurgitation is not visualized. No aortic stenosis is present. Aortic valve mean gradient measures 2.0 mmHg. Aortic valve peak gradient measures 3.1 mmHg. Aortic valve area, by VTI measures 2.10 cm. Pulmonic Valve: The pulmonic valve was normal in structure. Pulmonic valve regurgitation is not visualized. No evidence of pulmonic stenosis. Aorta: The aortic root is normal in size and structure. Venous: The inferior vena cava is normal in size with greater than 50% respiratory variability, suggesting right atrial pressure of 3 mmHg. IAS/Shunts: No atrial level shunt detected by color flow Doppler. Additional Comments: There is a moderate pleural effusion in the left lateral region.  LEFT VENTRICLE PLAX 2D LVIDd:         3.64 cm  Diastology LVIDs:         3.19 cm  LV e' lateral:   3.94 cm/s LV PW:         1.53 cm  LV E/e' lateral: 21.6 LV IVS:        1.92 cm  LV e' medial:    3.62 cm/s LVOT diam:     2.30 cm  LV E/e' medial:  23.6 LV SV:         32 LV SV Index:   18 LVOT Area:     4.15 cm  RIGHT VENTRICLE RV S prime:     6.62 cm/s TAPSE (M-mode): 1.7 cm LEFT ATRIUM             Index       RIGHT ATRIUM           Index LA diam:        3.90 cm 2.24 cm/m  RA Area:     21.40 cm LA Vol (A2C):   75.0 ml 43.16 ml/m RA Volume:   63.70 ml  36.65 ml/m LA Vol (A4C):   57.8 ml 33.26 ml/m LA Biplane Vol: 66.7 ml 38.38 ml/m  AORTIC VALVE AV Area (Vmax):    2.10 cm AV Area (Vmean):   2.01 cm AV Area (VTI):     2.10 cm AV Vmax:           87.90 cm/s AV Vmean:          68.100 cm/s AV VTI:            0.151  m AV Peak Grad:      3.1 mmHg AV Mean Grad:      2.0 mmHg LVOT Vmax:         44.40 cm/s LVOT Vmean:        33.000 cm/s LVOT VTI:          0.076 m LVOT/AV VTI ratio: 0.51  AORTA Ao Root diam: 3.20 cm MITRAL VALVE                 TRICUSPID VALVE MV Area (PHT): 3.42 cm      TR Peak grad:   23.0 mmHg MV Peak grad:  3.4 mmHg      TR Vmax:        240.00 cm/s MV Mean grad:  2.0 mmHg MV Vmax:       0.93 m/s      SHUNTS MV Vmean:      61.9 cm/s     Systemic VTI:  0.08 m MV Decel Time: 222 msec      Systemic Diam: 2.30 cm MR Peak grad:    59.6 mmHg MR Mean grad:    36.0 mmHg MR Vmax:         386.00 cm/s MR Vmean:        284.0 cm/s MR PISA:         2.26 cm MR PISA Eff ROA: 27 mm MR PISA Radius:  0.60 cm MV E velocity: 85.30 cm/s Isaias Cowman MD Electronically signed by Isaias Cowman MD Signature Date/Time: 12/21/2019/10:54:40 AM    Final     Assessment:  KAYLYNE AXTON is a 86 y.o. female with a history of CHF (EF 20-25%), atrial fibrillation, chronic pericardial effusion and recurrent pleural effusion.  Pleural fluid cytology was negative in 07/2019.  She is admitted with altered mental status and a decline in performance status over the past month.  She has acutely lose 12 pounds (80 pounds over the past 2 years).  Head CT without contrast on 12/15/2019 revealed atrophy and chronic vessel ischemic changes.  Chest CT with contrast on 12/15/2019 revealed multiple rounded ill-defined opacities  throughout both lungs, but most prominently seen in the lung bases. Etiology is likely multifocal pneumonia, potentially of viral etiology, but metastatic disease cannot be excluded. There was a large pericardial effusion and moderate,  probably loculated left pleural effusion which appeared stable.  There was a small stable right pleural effusion.  There were several new rounded low densities within the liver concerning for metastatic disease. There was a stable 3.0 cm right thyroid nodule with multiple  calcifications.  Abdomen and pelvis CT on 12/16/2019 revealed numerous hepatic masses (largest 1.8 cm).  Dedicated liver MRI was recommended for complete characterization. Metastatic disease remained the diagnosis of exclusion.  There was nodular consolidation at the lung bases c/w metastatic disease.  There was a large stable pericardial effusion and bilateral pleural effusions.  There was a small amount of free fluid within the abdomen and pelvis.  Liver MRI on 12/19/2019 revealed motion artifact.  There were multiple liver lesions suspicious for metastatic disease.  No primary malignancy identified within the abdomen.  There was pleural and pericardial fluid with small volume abdominal ascites.  Echo on 12/21/2019 revealed an EF of 25-30%.  She has a large pericardial effusion.  There was no evidence of tamponade.  Symptomatically, she denies any complaint.  She is on comfort care.  She is surrounded by her family.  Plan:   1.   Liver and lung lesions             Discussed imaging with family.  Lesions worrisome for metastatic disease (stage IV disease).             Treatment would be palliative (not curative).  Performance status remains poor.             Reviewed notes by Dr Raul Del.  Initial consideration for thoracentesis.  Given recent events, no current plan for intervention.  Family comfortable with supportive/palliative care.  Multiple questions asked and answered.     2.    Code status             Patient is DNR.     Lequita Asal, MD  12/21/2019, 9:36 PM

## 2019-12-21 NOTE — Progress Notes (Signed)
PROGRESS NOTE    Autumn Bradley  LVM:[id number] DOB: 1932/07/01 DOA: 12/15/2019 PCP: Lynnea Ferrier, MD   Brief Narrative:  Autumn Bradley is a 86 y.o. Caucasian female with medical history significant of systolic CHF, Afib on Eliquis, pleural effusions and pericardial effusion who presented with a myriad of complaints. Basically a failure to thrive and declining health.  There was some concern of some confusion and hallucinations over the past 1 month.  CT chest with multiple nodular opacities bilaterally in lung and liver, concern for metastatic disease with unknown primary.  Subjective: Overnight patient becomes hypotensive, hypoxic and tachycardic.  She was on BiPAP.  Softer blood pressure.  Patient appears lethargic but denies any pain.  Son was at bedside.  Assessment & Plan:   Active Problems:   AMS (altered mental status)   Pulmonary nodules   Failure to thrive (0-17)   DNR (do not resuscitate)   Protein-calorie malnutrition, severe   Poor fluid intake  Acute respiratory failure.  Patient developed acute respiratory failure overnight requiring BiPAP now.  She appears very lethargic but able to answer few orientation questions appropriately.  Patient was saturating in mid 90s on BiPAP.  Blood pressure was in high 80s systolic which improved to 90s with a 500 cc bolus.  Chest x-ray with little worsening of cardiomegaly and pleural effusions.  EKG low voltage.  Stat echocardiogram was obtained due to concern of tamponade.  Echo with EF of 20 to 25% and a large pericardial circumferential effusion without any sign of tamponade.  Cardiology was also consulted and they do not think that she needs any emergent pericardiocentesis. -Patient was scheduled for thoracentesis today-I sent a message to Dr. Meredeth Ide regarding currently decompensated respiratory status-we will appreciate his recommendations. -Continue BiPAP for now. -This might be a terminal event for her as she is very high risk  for deterioration and death.  Family is very understanding and does not want very heroic measures. -We will continue to monitor-we will try to avoid extra IV fluids due to very low EF, if she persistently remained hypotensive then she might need pressors. -Family is going to talk amongst self for comfort measures.  Altered mental status/failure to thrive/falls.  Appears to be at her baseline now. Per family concern patient is becoming confused and hallucinations at times.  She was alert and oriented x3 with me.  Able to answer all the questions appropriately.  Patient has a progressive decline over the past year.  She recently lost her son due to lymphoma. -Delirium precautions. -PT/OT evaluation-pending SNF placement. -She will go to SNF with hospice services.  Nodular opacities in lungs and liver Weight loss --concerning for metastatic disease.  Patient and her daughter-in-law was not aware of any prior diagnosis of any type of cancer.  She does not want very aggressive measures for diagnosis or treatment.  Daughter-in-law also wants to involve oncology for a second opinion.  She was also agreeable to see a palliative care for symptom management, as she wants patient to remain comfortable. -CT abdomen and pelvis-with numerous hepatic masses, advising dedicated liver MRI.  Small amount of free fluid within abdomen and pelvis. -Consult oncology-Dr. Merlene Pulling will review her imaging with IR and will come up some plan for definitive diagnosis.  Patient's family wants diagnosis but might not consider chemotherapy if it is a metastatic disease. -Consult palliative care. -Dr. Meredeth Ide, her pulmonologist was consulted by oncology and they requested a left decubitus chest x-ray-scheduled thoracentasisfor today, sent  with decompensated respiratory status at this time.  She is not a good candidate for bronchoscopy.  - MRI liver-with multiple liver lesions highly suspicious for metastatic disease. -PET Scan  can be considered as an outpatient.  AKI.  Resolved  -Continue to monitor. -Avoid nephrotoxins  Hyponatremia.  It was thought to be due to poor p.o. intake.  Sodium remains between 1 26-1 28 despite giving some fluid.  Hyponatremia labs with mildly elevated TSH and other concerning for SIADH. -Continue IV fluid-at a lower rate as patient is on BiPAP now and we will try to avoid volume overload. -Continue to monitor sodium. -Check hyponatremia labs-still pending.  Chronic cough.  No leukocytosis, patient remained afebrile.  Procalcitonin negative.  Patient recently finished Z-Pak with no improvement.  There is concern for malignancy.  She was given a dose of ceftriaxone and azithromycin in the ED which was not continued. -Supportive care with cough suppressant.  Bilateral pleural effusions, chronic --L>R.  Left side probably loculated.  Had left thoracentesis back on 07/08/18.   --Pulmonary is recommending repeat thoracentesis if there is a free-floating fluid on decubitus chest x-ray. -thoracentesis was planned for today but patient acutely decompensated.  Large pericardial effusion, chronic, stable -Known condition in Jan 2020, appeared to be stable since then. -There was some concern of cardiac tamponade secondary to acutely decompensated respiratory function hypertension.  Repeat echogram with persistent large pericardial effusion but there is no current sign of tamponade.  Afib on Eliquis -currently rate controlled.   -Holding home dose of Eliquis in case patient decided to go for biopsy.  Hx of chronic systolic CHF with LVEF 01-02% -BNP 781, but does not appear fluid overloaded.  Only on Lasix 20 mg PRN at home. -Hold diuretic since BP low.  Glaucoma -continue home eye drops  Objective: Vitals:   12/21/19 0329 12/21/19 0435 12/21/19 0500 12/21/19 0754  BP:  (!) 86/61  90/63  Pulse:  70  67  Resp:  20 20 (!) 22  Temp:    (!) 96.1 F (35.6 C)  TempSrc:    Axillary  SpO2:  95%     Weight:  65.2 kg    Height:        Intake/Output Summary (Last 24 hours) at 12/21/2019 1138 Last data filed at 12/21/2019 0558 Gross per 24 hour  Intake 240 ml  Output 0 ml  Net 240 ml   Filed Weights   12/15/19 1137 12/20/19 2314 12/21/19 0435  Weight: 56.7 kg 64.3 kg 65.2 kg    Examination:  General exam: Frail elderly lady with multiple ecchymosis, appears calm and comfortable with BiPAP on. Respiratory system: Harsh transmitted sounds bilaterally,. Respiratory effort normal. Cardiovascular system: S1 & S2 heard, RRR.  Gastrointestinal system: Soft, nontender, nondistended, bowel sounds positive. Central nervous system: Alert and oriented. No focal neurological deficits. Extremities: No edema, no cyanosis, pulses intact and symmetrical. Psychiatry: Judgement and insight appear normal.   DVT prophylaxis: Lovenox Code Status: DNR Family Communication: Son was updated at bedside. Disposition Plan:  Status is: Inpatient  Remains inpatient appropriate because:Ongoing diagnostic testing needed not appropriate for outpatient work up   Dispo: The patient is from: Home              Anticipated d/c is to: SNF with hospice services.              Anticipated d/c date is: 2 days              Patient currently is  not medically stable.  She had acutely decompensated overnight.  She is high risk for deterioration and death.  Family is deciding whether to keep current level of care or withdraw treatment and make her comfort care.   patient with concern of metastatic disease.  No worsening respiratory status and hypotension.  Had large pericardial and pleural effusions.  Palliative is involved.  Consultants:   Oncology  Palliative care  Procedures:  Antimicrobials:   Data Reviewed: I have personally reviewed following labs and imaging studies  CBC: Recent Labs  Lab 12/16/19 0502 12/17/19 0352 12/18/19 0507 12/20/19 2111 12/21/19 0625  WBC 3.1* 4.2 3.7* 4.4 3.0*    HGB 16.4* 17.0* 15.2* 16.0* 13.6  HCT 47.8* 49.2* 42.0 46.4* 39.9  MCV 87.4 87.1 83.8 88.0 88.3  PLT 92* 89* 103* 99* 87*   Basic Metabolic Panel: Recent Labs  Lab 12/16/19 0502 12/17/19 0352 12/18/19 0507 12/19/19 0338 12/21/19 0625  NA 129* 127* 126* 128* 126*  K 4.4 4.9 4.8 4.2 4.3  CL 93* 92* 91* 93* 91*  CO2 '24 23 25 28 23  '$ GLUCOSE 113* 111* 117* 110* 127*  BUN 16 25* 33* 25* 24*  CREATININE 0.85 1.01* 1.24* 0.84 0.81  CALCIUM 8.0* 8.3* 8.6* 8.4* 8.8*  MG 1.8 2.1 2.2  --   --   PHOS  --   --   --  2.5  --    GFR: Estimated Creatinine Clearance: 46.7 mL/min (by C-G formula based on SCr of 0.81 mg/dL). Liver Function Tests: Recent Labs  Lab 12/15/19 1153 12/19/19 0338  AST 70*  --   ALT 27  --   ALKPHOS 72  --   BILITOT 1.6*  --   PROT 5.7*  --   ALBUMIN 3.5 2.7*   No results for input(s): LIPASE, AMYLASE in the last 168 hours. No results for input(s): AMMONIA in the last 168 hours. Coagulation Profile: Recent Labs  Lab 12/17/19 0352  INR 1.3*   Cardiac Enzymes: No results for input(s): CKTOTAL, CKMB, CKMBINDEX, TROPONINI in the last 168 hours. BNP (last 3 results) No results for input(s): PROBNP in the last 8760 hours. HbA1C: No results for input(s): HGBA1C in the last 72 hours. CBG: No results for input(s): GLUCAP in the last 168 hours. Lipid Profile: No results for input(s): CHOL, HDL, LDLCALC, TRIG, CHOLHDL, LDLDIRECT in the last 72 hours. Thyroid Function Tests: No results for input(s): TSH, T4TOTAL, FREET4, T3FREE, THYROIDAB in the last 72 hours. Anemia Panel: No results for input(s): VITAMINB12, FOLATE, FERRITIN, TIBC, IRON, RETICCTPCT in the last 72 hours. Sepsis Labs: Recent Labs  Lab 12/15/19 1153 12/15/19 1257 12/20/19 2111 12/20/19 2348  PROCALCITON <0.10  --  <0.10  --   LATICACIDVEN  --  1.7 2.3* 2.0*    Recent Results (from the past 240 hour(s))  Urine culture     Status: None   Collection Time: 12/15/19 11:53 AM    Specimen: Urine, Random  Result Value Ref Range Status   Specimen Description   Final    URINE, RANDOM Performed at [facility], [address], [place], [place] [address]    Special Requests   Final    NONE Performed at [facility], [address] [place], [place] [address]    Culture   Final    NO GROWTH Performed at [facility], [place] [address] [place], [place] [address]    Report Status 12/16/2019 FINAL  Final  SARS Coronavirus 2 by RT  PCR (hospital order, performed in [facility] hospital lab) Nasopharyngeal Nasopharyngeal Swab     Status: None   Collection Time: 12/15/19  2:38 PM   Specimen: Nasopharyngeal Swab  Result Value Ref Range Status   SARS Coronavirus 2 NEGATIVE NEGATIVE Final    Comment: (NOTE) SARS-CoV-2 target nucleic acids are NOT DETECTED.  The SARS-CoV-2 RNA is generally detectable in upper and lower respiratory specimens during the acute phase of infection. The lowest concentration of SARS-CoV-2 viral copies this assay can detect is 250 copies / mL. A negative result does not preclude SARS-CoV-2 infection and should not be used as the sole basis for treatment or other patient management decisions.  A negative result may occur with improper specimen collection / handling, submission of specimen other than nasopharyngeal swab, presence of viral mutation(s) within the areas targeted by this assay, and inadequate number of viral copies (<250 copies / mL). A negative result must be combined with clinical observations, patient history, and epidemiological information.  Fact Sheet for Patients:   StrictlyIdeas.no  Fact Sheet for Healthcare Providers: BankingDealers.co.za  This test is not yet approved or  cleared by the [place] FDA and has been authorized for detection and/or diagnosis of SARS-CoV-2 by FDA under an Emergency Use Authorization (EUA).  This EUA will remain in  effect (meaning this test can be used) for the duration of the COVID-19 declaration under Section 564(b)(1) of the Act, 21 U.S.C. section 360bbb-3(b)(1), unless the authorization is terminated or revoked sooner.  Performed at [facility], [address] [place], [place] [address]   Blood culture (routine x 2)     Status: None   Collection Time: 12/15/19  2:38 PM   Specimen: BLOOD  Result Value Ref Range Status   Specimen Description BLOOD LEFT ANTECUBITAL  Final   Special Requests   Final    BOTTLES DRAWN AEROBIC AND ANAEROBIC Blood Culture adequate volume   Culture   Final    NO GROWTH 5 DAYS Performed at [facility], [address] [place], [place] [address]    Report Status 12/20/2019 FINAL  Final  Blood culture (routine x 2)     Status: None   Collection Time: 12/15/19  2:38 PM   Specimen: BLOOD  Result Value Ref Range Status   Specimen Description BLOOD BLOOD RIGHT ARM  Final   Special Requests   Final    BOTTLES DRAWN AEROBIC AND ANAEROBIC Blood Culture adequate volume   Culture   Final    NO GROWTH 5 DAYS Performed at [facility], [address] [place], [place] [address]    Report Status 12/20/2019 FINAL  Final     Radiology Studies: DG Chest 1 View  Result Date: 12/20/2019 CLINICAL DATA:  Hypoxia. EXAM: CHEST  1 VIEW COMPARISON:  December 19, 2019 FINDINGS: Mild multifocal patchy infiltrates are seen throughout both lungs. Mild to moderate severity infiltrates are also noted within the bilateral lung bases. There is a very small right pleural effusion. A small to moderate size left pleural effusion is seen. No pneumothorax is identified. The cardiac silhouette is markedly enlarged. There is mild calcification of the aortic arch. Degenerative changes seen involving the thoracic spine. IMPRESSION: 1. Mild multifocal patchy infiltrates throughout both lungs. 2. Mild to moderate severity bibasilar infiltrates. 3. Small to moderate size left  pleural effusion. 4. Very small right pleural effusion. Electronically Signed   By: Virgina Norfolk M.D.   On: 12/20/2019 21:54   DG Chest Left  Decubitus  Result Date: 12/19/2019 CLINICAL DATA:  Recurrent left pleural effusion. EXAM: CHEST - LEFT DECUBITUS COMPARISON:  Chest x-ray and chest CT dated 12/15/2019 and CT scan of the abdomen dated 12/16/2019 FINDINGS: Left lateral decubitus view does demonstrate lateral layering of the left pleural effusion. Cardiac silhouette remains enlarged consistent with a large pericardial effusion demonstrated on the prior CT scans. Medial layering of the small right pleural effusion. IMPRESSION: 1. Lateral layering of the left pleural effusion. 2. Medial layering of a small right effusion. Electronically Signed   By: Lorriane Shire M.D.   On: 12/19/2019 17:30   MR LIVER W WO CONTRAST  Result Date: 12/19/2019 CLINICAL DATA:  Liver lesions on CT. EXAM: MRI ABDOMEN WITHOUT AND WITH CONTRAST TECHNIQUE: Multiplanar multisequence MR imaging of the abdomen was performed both before and after the administration of intravenous contrast. CONTRAST:  84m GADAVIST GADOBUTROL 1 MMOL/ML IV SOLN COMPARISON:  Abdominal CT 12/16/2019. FINDINGS: Moderate degradation, including extensive respiratory motion, patient arm position, over the anterior abdominal wall. Lower chest: Large pericardial effusion with moderate bilateral pleural effusions. Bibasilar pulmonary nodularity, suboptimally evaluated. Hepatobiliary: No convincing evidence of cirrhosis. Multiple mildly T2 hyperintense, arterially and less so portal venous phase hyperenhancing liver lesions, highly suspicious for metastatic disease. Example in segment 4A at 2.1 cm on 32/12. Segment 7 at 2.1 cm on 30/12. More vague, in segment 8 at 1.0 cm on 30/12. Cholecystectomy, without biliary ductal dilatation. Pancreas: Grossly normal pancreas, without duct dilatation or dominant mass. Spleen:  Normal in size, without focal abnormality.  Adrenals/Urinary Tract: Bilateral mild adrenal nodularity. Upper pole right renal too small to characterize lesion. Normal left kidney. No hydronephrosis. Stomach/Bowel: Grossly normal stomach and abdominal bowel loops. Vascular/Lymphatic: Aortic atherosclerosis. No abdominal adenopathy. Other:  Small volume perihepatic and perisplenic ascites. Musculoskeletal: No acute osseous abnormality. IMPRESSION: 1. Moderately motion and patient position degraded exam. 2. Multiple liver lesions, again suspicious for metastatic disease. 3. No primary malignancy identified within the abdomen. 4. Pleural and pericardial fluid with small volume abdominal ascites, as before. Electronically Signed   By: KAbigail MiyamotoM.D.   On: 12/19/2019 14:39   [place]Venous Img Upper Uni Right(DVT)  Result Date: 12/21/2019 CLINICAL DATA:  Right upper extremity edema. EXAM: RIGHT UPPER EXTREMITY VENOUS DOPPLER ULTRASOUND TECHNIQUE: Gray-scale sonography with graded compression, as well as color Doppler and duplex ultrasound were performed to evaluate the upper extremity deep venous system from the level of the subclavian vein and including the jugular, axillary, basilic, radial, ulnar and upper cephalic vein. Spectral Doppler was utilized to evaluate flow at rest and with distal augmentation maneuvers. COMPARISON:  None. FINDINGS: Contralateral Subclavian Vein: Respiratory phasicity is normal and symmetric with the symptomatic side. No evidence of thrombus. Normal compressibility. Internal Jugular Vein: No evidence of thrombus. Normal compressibility, respiratory phasicity and response to augmentation. Subclavian Vein: No evidence of thrombus. Normal compressibility, respiratory phasicity and response to augmentation. Axillary Vein: No evidence of thrombus. Normal compressibility, respiratory phasicity and response to augmentation. Cephalic Vein: No evidence of thrombus. Normal compressibility, respiratory phasicity and response to augmentation.  Basilic Vein: No evidence of thrombus. Normal compressibility, respiratory phasicity and response to augmentation. Brachial Veins: No evidence of thrombus. Normal compressibility, respiratory phasicity and response to augmentation. Radial Veins: No evidence of thrombus. Normal compressibility, respiratory phasicity and response to augmentation. Ulnar Veins: No evidence of thrombus. Normal compressibility, respiratory phasicity and response to augmentation. Venous Reflux:  None visualized. Other Findings:  None visualized. IMPRESSION: No evidence of DVT  within the right upper extremity. Electronically Signed   By: Marin Olp M.D.   On: 12/21/2019 08:49   ECHOCARDIOGRAM COMPLETE  Result Date: 12/21/2019    ECHOCARDIOGRAM REPORT   Patient Name:   AMEISHA MCCLELLAN Date of Exam: 12/21/2019 Medical Rec #:  [id number]      Height:       66.0 in Accession #:    [id number]     Weight:       143.7 lb Date of Birth:  01-Mar-1932       BSA:          1.738 m Patient Age:    32 years       BP:           90/63 mmHg Patient Gender: F              HR:           67 bpm. Exam Location:  [facility] Procedure: 2D Echo STAT ECHO Indications:     R/O Cardiac Tamponade  History:         Patient has prior history of Echocardiogram examinations.                  Pericardial Effusion, CA, Arrythmias:Atrial Fibrillation; Risk                  Factors:Hypertension.  Sonographer:     L Thornton-Maynard Referring Phys:  [id number] Franny Selvage Diagnosing Phys: Isaias Cowman MD IMPRESSIONS  1. Left ventricular ejection fraction, by estimation, is 25 to 30%. The left ventricle has severely decreased function. The left ventricle has no regional wall motion abnormalities. There is moderate left ventricular hypertrophy. Left ventricular diastolic parameters are indeterminate.  2. Right ventricular systolic function is normal. The right ventricular size is normal. There is normal pulmonary artery systolic pressure.  3. Large pericardial effusion. The  pericardial effusion is circumferential. There is no evidence of cardiac tamponade. Moderate pleural effusion in the left lateral region.  4. The mitral valve is normal in structure. Moderate to severe mitral valve regurgitation. No evidence of mitral stenosis.  5. Tricuspid valve regurgitation is mild to moderate.  6. The aortic valve is normal in structure. Aortic valve regurgitation is not visualized. No aortic stenosis is present.  7. The inferior vena cava is normal in size with greater than 50% respiratory variability, suggesting right atrial pressure of 3 mmHg. FINDINGS  Left Ventricle: Left ventricular ejection fraction, by estimation, is 25 to 30%. The left ventricle has severely decreased function. The left ventricle has no regional wall motion abnormalities. The left ventricular internal cavity size was normal in size. There is moderate left ventricular hypertrophy. Left ventricular diastolic parameters are indeterminate. Right Ventricle: The right ventricular size is normal. No increase in right ventricular wall thickness. Right ventricular systolic function is normal. There is normal pulmonary artery systolic pressure. The tricuspid regurgitant velocity is 2.40 m/s, and  with an assumed right atrial pressure of 10 mmHg, the estimated right ventricular systolic pressure is 31.5 mmHg. Left Atrium: Left atrial size was normal in size. Right Atrium: Right atrial size was normal in size. Pericardium: A large pericardial effusion is present. The pericardial effusion is circumferential. There is no evidence of cardiac tamponade. Mitral Valve: The mitral valve is normal in structure. Normal mobility of the mitral valve leaflets. Moderate to severe mitral valve regurgitation. No evidence of mitral valve stenosis. MV peak gradient, 3.4 mmHg. The mean mitral valve gradient is 2.0 mmHg.  Tricuspid Valve: The tricuspid valve is normal in structure. Tricuspid valve regurgitation is mild to moderate. No evidence of  tricuspid stenosis. Aortic Valve: The aortic valve is normal in structure. Aortic valve regurgitation is not visualized. No aortic stenosis is present. Aortic valve mean gradient measures 2.0 mmHg. Aortic valve peak gradient measures 3.1 mmHg. Aortic valve area, by VTI measures 2.10 cm. Pulmonic Valve: The pulmonic valve was normal in structure. Pulmonic valve regurgitation is not visualized. No evidence of pulmonic stenosis. Aorta: The aortic root is normal in size and structure. Venous: The inferior vena cava is normal in size with greater than 50% respiratory variability, suggesting right atrial pressure of 3 mmHg. IAS/Shunts: No atrial level shunt detected by color flow Doppler. Additional Comments: There is a moderate pleural effusion in the left lateral region.  LEFT VENTRICLE PLAX 2D LVIDd:         3.64 cm  Diastology LVIDs:         3.19 cm  LV e' lateral:   3.94 cm/s LV PW:         1.53 cm  LV E/e' lateral: 21.6 LV IVS:        1.92 cm  LV e' medial:    3.62 cm/s LVOT diam:     2.30 cm  LV E/e' medial:  23.6 LV SV:         32 LV SV Index:   18 LVOT Area:     4.15 cm  RIGHT VENTRICLE RV S prime:     6.62 cm/s TAPSE (M-mode): 1.7 cm LEFT ATRIUM             Index       RIGHT ATRIUM           Index LA diam:        3.90 cm 2.24 cm/m  RA Area:     21.40 cm LA Vol (A2C):   75.0 ml 43.16 ml/m RA Volume:   63.70 ml  36.65 ml/m LA Vol (A4C):   57.8 ml 33.26 ml/m LA Biplane Vol: 66.7 ml 38.38 ml/m  AORTIC VALVE AV Area (Vmax):    2.10 cm AV Area (Vmean):   2.01 cm AV Area (VTI):     2.10 cm AV Vmax:           87.90 cm/s AV Vmean:          68.100 cm/s AV VTI:            0.151 m AV Peak Grad:      3.1 mmHg AV Mean Grad:      2.0 mmHg LVOT Vmax:         44.40 cm/s LVOT Vmean:        33.000 cm/s LVOT VTI:          0.076 m LVOT/AV VTI ratio: 0.51  AORTA Ao Root diam: 3.20 cm MITRAL VALVE                 TRICUSPID VALVE MV Area (PHT): 3.42 cm      TR Peak grad:   23.0 mmHg MV Peak grad:  3.4 mmHg      TR Vmax:         240.00 cm/s MV Mean grad:  2.0 mmHg MV Vmax:       0.93 m/s      SHUNTS MV Vmean:      61.9 cm/s     Systemic VTI:  0.08 m MV Decel Time: 222 msec  Systemic Diam: 2.30 cm MR Peak grad:    59.6 mmHg MR Mean grad:    36.0 mmHg MR Vmax:         386.00 cm/s MR Vmean:        284.0 cm/s MR PISA:         2.26 cm MR PISA Eff ROA: 27 mm MR PISA Radius:  0.60 cm MV E velocity: 85.30 cm/s Isaias Cowman MD Electronically signed by Isaias Cowman MD Signature Date/Time: 12/21/2019/10:54:40 AM    Final     Scheduled Meds: . atorvastatin  10 mg Oral QPM  . diltiazem  300 mg Oral QHS  . enoxaparin (LOVENOX) injection  40 mg Subcutaneous Q24H  . feeding supplement (ENSURE ENLIVE)  237 mL Oral BID BM  . latanoprost  1 drop Both Eyes QHS  . LORazepam  0.5 mg Intravenous Once  . timolol  1 drop Both Eyes Daily   Continuous Infusions:    LOS: 6 days   Time spent: 50 minutes.  Lorella Nimrod, MD [facility]  If 7PM-7AM, please contact night-coverage [email or web address]  12/21/2019, 11:38 AM   This record has been created using Systems analyst. Errors have been sought and corrected,but may not always be located. Such creation errors do not reflect on the standard of care.

## 2019-12-21 NOTE — Progress Notes (Signed)
Patient is alert and talking to family; is not SOB-  She was able to eat a little food without complications.  Pain denies pain at this time.

## 2019-12-22 ENCOUNTER — Inpatient Hospital Stay: Payer: Medicare HMO

## 2019-12-22 LAB — PROTEIN, PLEURAL OR PERITONEAL FLUID: Total protein, fluid: <3 g/dL

## 2019-12-22 LAB — AMYLASE, PLEURAL OR PERITONEAL FLUID: Amylase, Fluid: 82 U/L

## 2019-12-22 LAB — ANCA TITERS
Atypical P-ANCA titer: <1:20 {titer}
C-ANCA: <1:20 {titer}
P-ANCA: <1:20 {titer}

## 2019-12-22 LAB — GLUCOSE, PLEURAL OR PERITONEAL FLUID: Glucose, Fluid: 106 mg/dL

## 2019-12-22 LAB — BODY FLUID CELL COUNT WITH DIFFERENTIAL
Eos, Fluid: 0 %
Lymphs, Fluid: 62 %
Monocyte-Macrophage-Serous Fluid: 35 %
Neutrophil Count, Fluid: 3 %
Total Nucleated Cell Count, Fluid: 195 cu mm

## 2019-12-22 NOTE — Progress Notes (Signed)
OT Cancellation Note  Patient Details Name: Autumn Bradley MRN: [id number] DOB: 11/16/1931   Cancelled Treatment:    Reason Eval/Treat Not Completed: Medical issues which prohibited therapy  Pt transitioned to higher level of care-to Progressive Care Unit over weekend d/t need for BiPap. No Continue at transfer orders noted at this time. Will complete order and sign off. Thank you.  Gerrianne Scale, Walker, OTR/L ascom [phone] 12/22/19, 2:12 PM

## 2019-12-22 NOTE — Procedures (Signed)
°  Procedure: US thoracentesis left   EBL:   minimal Complications:  none immediate  See full dictation in [facility].  Dillard Cannon MD Main # [phone] Pager  [phone]

## 2019-12-22 NOTE — Plan of Care (Signed)

## 2019-12-22 NOTE — Care Management Important Message (Signed)
Important Message  Patient Details  Name: Autumn Bradley MRN: [id number] Date of Birth: 05-30-1932   Medicare Important Message Given:  Yes     Juliann Pulse A Zvi Duplantis 12/22/2019, 11:26 AM

## 2019-12-22 NOTE — Progress Notes (Signed)
Physical Therapy Discharge Patient Details Name: Autumn Bradley MRN: [id number] DOB: 11-23-1931 Today's Date: 12/22/2019 Time:  -     Patient discharged from PT services secondary to transfer to higher level of care.  Please see latest therapy progress note for current level of functioning and progress toward goals.    Chart reviewed.  Pt transferred to PCCU over the week-end.  No continue on transfer orders noted.  Pt with general decline.  Will discharge therapy at this time after discussion with PT.  Will await new orders if/when appropriate.  GP     Chesley Noon 12/22/2019, 1:45 PM

## 2019-12-22 NOTE — Progress Notes (Signed)
Date: 12/22/2019,   MRN# [id number] Autumn Bradley 02-20-1932 Code Status:     Code Status Orders  (From admission, onward)         Start     Ordered   12/21/19 1351  Do not attempt resuscitation (DNR)  Continuous       Question Answer Comment  In the event of cardiac or respiratory ARREST Do not call a "code blue"   In the event of cardiac or respiratory ARREST Do not perform Intubation, CPR, defibrillation or ACLS   In the event of cardiac or respiratory ARREST Use medication by any route, position, wound care, and other measures to relive pain and suffering. May use oxygen, suction and manual treatment of airway obstruction as needed for comfort.      12/21/19 1353        Code Status History    Date Active Date Inactive Code Status Order ID Comments User Context   12/15/2019 1645 12/21/2019 1353 DNR [id number]  Enzo Bi, MD Inpatient   07/07/2018 1014 07/11/2018 1736 DNR [id number]  Hillary Bow, MD ED   07/07/2018 0907 07/07/2018 1014 Full Code [id number]  Hillary Bow, MD ED   Advance Care Planning Activity     Hosp day:@LENGTHOFSTAYDAYS @ Referring MD: @ATDPROV @     PCP:       HPI last pm had bradycardia, sob, unstable vital. On telemtry. Back to her baseline. No other complaints. Long conversation with family.     PMHX:   Past Medical History:  Diagnosis Date  . Atrial fibrillation ([place])   . Cancer [facility])    SKIN CANCER  . Degenerative joint disease   . Glaucoma   . Hypercholesteremia   . Hypertension    CONTROLLED ON MEDS  . Shortness of breath dyspnea    [place]  . Wears dentures    partial upper and lower   Surgical Hx:  Past Surgical History:  Procedure Laterality Date  . ABDOMINAL HYSTERECTOMY    . CARDIAC CATHETERIZATION  07/2012   [facility] - Neg for blockages  . CARDIAC ELECTROPHYSIOLOGY STUDY AND ABLATION    . CATARACT EXTRACTION W/PHACO Left 03/17/2015   Procedure: CATARACT EXTRACTION PHACO AND INTRAOCULAR LENS PLACEMENT (IOC);  Surgeon: Leandrew Koyanagi, MD;  Location: [place];  Service: Ophthalmology;  Laterality: Left;  . CATARACT EXTRACTION W/PHACO Right 04/14/2015   Procedure: CATARACT EXTRACTION PHACO AND INTRAOCULAR LENS PLACEMENT (IOC);  Surgeon: Leandrew Koyanagi, MD;  Location: [place];  Service: Ophthalmology;  Laterality: Right;  . CHOLECYSTECTOMY    . COLONOSCOPY    . TONSILLECTOMY     Family Hx:  Family History  Problem Relation Age of Onset  . Diabetes Mellitus II Mother   . Heart failure Father   . COPD Sister   . Heart attack Sister   . Heart failure Sister   . Colon cancer Brother   . Kidney cancer Brother   . Lung cancer Brother    Social Hx:   Social History   Tobacco Use  . Smoking status: Never Smoker  . Smokeless tobacco: Never Used  Substance Use Topics  . Alcohol use: No  . Drug use: Not on file   Medication:    Home Medication:    Current Medication: @CURMEDTAB @   Allergies:  Patient has no known allergies.  Review of Systems: Gen:  Denies  fever, sweats, chills HEENT: Denies blurred vision, double vision, ear pain, eye pain, hearing loss, nose bleeds, sore throat Cvc:  No dizziness, chest pain or heaviness Resp:    Gi: Denies swallowing difficulty, stomach pain, nausea or vomiting, diarrhea, constipation, bowel incontinence Gu:  Denies bladder incontinence, burning urine Ext:   No Joint pain, stiffness or swelling Skin: No skin rash, easy bruising or bleeding or hives Endoc:  No polyuria, polydipsia , polyphagia or weight change Psych: No depression, insomnia or hallucinations  Other:  All other systems negative  Physical Examination:   VS: BP 134/69 (BP Location: Left Arm)   Pulse 85   Temp 97.6 F (36.4 C) (Axillary)   Resp 18   Ht 5\' 6"  (1.676 m)   Wt 65.2 kg   SpO2 100%   BMI 23.20 kg/m   General Appearance: No distress  Neuro: without focal findings, mental status, speech normal, alert and oriented, cranial nerves 2-12 intact,  reflexes normal and symmetric, sensation grossly normal  HEENT: PERRLA, EOM intact, no ptosis, no other lesions noticed, Mallampati: Pulmonary:.No wheezing, No rales  Sputum Production:   Cardiovascular:  Normal S1,S2.  No m/r/g.  Abdominal aorta pulsation normal.    Abdomen:Benign, Soft, non-tender, No masses, hepatosplenomegaly, No lymphadenopathy Endoc: No evident thyromegaly, no signs of acromegaly or Cushing features Skin:   warm, no rashes, no ecchymosis  Extremities: normal, no cyanosis, clubbing, no edema, warm with normal capillary refill. Other findings:   Labs results:   Recent Labs    12/20/19 2111 12/21/19 0625  HGB 16.0* 13.6  HCT 46.4* 39.9  MCV 88.0 88.3  WBC 4.4 3.0*  BUN  --  24*  CREATININE  --  0.81  GLUCOSE  --  127*  CALCIUM  --  8.8*  ,      Rad results:   No results found.    EKG:     Other:   Assessment and Plan: This is an elderly, frail lady, with multiple problems. Pulmonary wise she has; 1. Primarily basal nodular infiltrates with the MRI findings points mot likely to metastatic cancer. At  present not a safe candidate for bronchoscopy. He is willing to allow left thoracentesis with the understanding that this is both for diagnostic and therapeutic reasons and there is some risk. -follwing, family are moving towards comfort care, rightly so.   2. Left moderate size effusion, small right, ef 25 %. CHF is most likely the cause. Since the left pl effusion is large there may be an additional cause. Pleural effusion is free flowing  -left thoracentesis (c/s, cytology, t.prot, ldh, glu, alb, cell count and dif)l  3. Pericardial effusion, cardiology following -following   I have personally obtained a history, examined the patient, evaluated laboratory and imaging results, formulated the assessment and plan and placed orders.  The Patient requires high complexity decision making for assessment and support, frequent evaluation and titration of  therapies, application of advanced monitoring technologies and extensive interpretation of multiple databases.   Habeeb Puertas,M.D. Pulmonary & Critical care Medicine [facility]

## 2019-12-22 NOTE — Progress Notes (Signed)
PROGRESS NOTE    Autumn Bradley  MBT:[id number] DOB: June 01, 1932 DOA: 12/15/2019 PCP: Adin Hector, MD   Brief Narrative:  Autumn Bradley is a 86 y.o. Caucasian female with medical history significant of systolic CHF, Afib on Eliquis, pleural effusions and pericardial effusion who presented with a myriad of complaints. Basically a failure to thrive and declining health.  There was some concern of some confusion and hallucinations over the past 1 month.  CT chest with multiple nodular opacities bilaterally in lung and liver, concern for metastatic disease with unknown primary.  Subjective: Patient was feeling better when seen today.  She was sitting upright and talking with her family.  She told me that she did eat some breakfast.  Assessment & Plan:   Active Problems:   AMS (altered mental status)   Pulmonary nodules   Failure to thrive (0-17)   DNR (do not resuscitate)   Protein-calorie malnutrition, severe   Poor fluid intake  Acute respiratory failure.  Patient developed acute respiratory failure overnight requiring BiPAP now.  She appears very lethargic but able to answer few orientation questions appropriately.  Patient was saturating in mid 90s on BiPAP.  Blood pressure was in high 53M systolic which improved to 90s with a 500 cc bolus.  Chest x-ray with little worsening of cardiomegaly and pleural effusions.  EKG low voltage.  Stat echocardiogram was obtained due to concern of tamponade.  Echo with EF of 20 to 25% and a large pericardial circumferential effusion without any sign of tamponade.  Cardiology was also consulted and they do not think that she needs any emergent pericardiocentesis. -Patient was scheduled for thoracentesis today-I sent a message to Dr. Raul Del regarding currently decompensated respiratory status-we will appreciate his recommendations. -Continue BiPAP for now. -This might be a terminal event for her as she is very high risk for deterioration and death.   Family is very understanding and does not want very heroic measures. -We will continue to monitor-we will try to avoid extra IV fluids due to very low EF, if she persistently remained hypotensive then she might need pressors. -Family is going to talk amongst self for comfort measures.  Yesterday she was transitioned to comfort measures after discussing with family due to acute deterioration.  She gave another surprise after taking her off from BiPAP.  Couple of hours later she becomes more responsive. This morning she was feeling back to her baseline.  -Family decided to proceed with thoracentesis today.  Altered mental status/failure to thrive/falls.  Appears to be at her baseline now. Per family concern patient is becoming confused and hallucinations at times.  She was alert and oriented x3 with me.  Able to answer all the questions appropriately.  Patient has a progressive decline over the past year.  She recently lost her son due to lymphoma. -Delirium precautions. -PT/OT evaluation-pending SNF placement. -She will go to SNF with hospice services/hospice home.  Nodular opacities in lungs and liver Weight loss --concerning for metastatic disease.  Patient and her daughter-in-law was not aware of any prior diagnosis of any type of cancer.  She does not want very aggressive measures for diagnosis or treatment.  Daughter-in-law also wants to involve oncology for a second opinion.  She was also agreeable to see a palliative care for symptom management, as she wants patient to remain comfortable. -CT abdomen and pelvis-with numerous hepatic masses, advising dedicated liver MRI.  Small amount of free fluid within abdomen and pelvis. -Consult oncology-Dr. Mike Gip will  review her imaging with IR and will come up some plan for definitive diagnosis.  Patient's family wants diagnosis but might not consider chemotherapy if it is a metastatic disease. -Consult palliative care. -Dr. Raul Del, her  pulmonologist was consulted by oncology and they requested a left decubitus chest x-ray-scheduled thoracentasisfor today, sent with decompensated respiratory status at this time.  She is not a good candidate for bronchoscopy.  - MRI liver-with multiple liver lesions highly suspicious for metastatic disease. -PET Scan can be considered as an outpatient.  AKI.  Resolved  -Continue to monitor. -Avoid nephrotoxins  Hyponatremia.  It was thought to be due to poor p.o. intake.  Sodium remains between 1 26-1 28 despite giving some fluid.  Hyponatremia labs with mildly elevated TSH and other concerning for SIADH. -Continue IV fluid-at a lower rate as patient is on BiPAP now and we will try to avoid volume overload. -Continue to monitor sodium. -Check hyponatremia labs-still pending.  Chronic cough.  No leukocytosis, patient remained afebrile.  Procalcitonin negative.  Patient recently finished Z-Pak with no improvement.  There is concern for malignancy.  She was given a dose of ceftriaxone and azithromycin in the ED which was not continued. -Supportive care with cough suppressant.  Bilateral pleural effusions, chronic --L>R.  Left side probably loculated.  Had left thoracentesis back on 07/08/18.   --Pulmonary is recommending repeat thoracentesis if there is a free-floating fluid on decubitus chest x-ray. -thoracentesis was planned for today but patient acutely decompensated.  Large pericardial effusion, chronic, stable -Known condition in Jan 2020, appeared to be stable since then. -There was some concern of cardiac tamponade secondary to acutely decompensated respiratory function hypertension.  Repeat echogram with persistent large pericardial effusion but there is no current sign of tamponade.  Afib on Eliquis -currently rate controlled.   -Holding home dose of Eliquis in case patient decided to go for biopsy.  Hx of chronic systolic CHF with LVEF 35-00% -BNP 781, but does not appear fluid  overloaded.  Only on Lasix 20 mg PRN at home. -Hold diuretic since BP low.  Glaucoma -continue home eye drops  Objective: Vitals:   12/21/19 1300 12/21/19 1310 12/21/19 1315 12/22/19 0721  BP: (!) 74/52 (!) 77/43 (!) 84/50 (!) 91/55  Pulse: 69 (!) 53 (!) 56 71  Resp: (!) 25 (!) _0 Temp:    97.6 F (36.4 C)  TempSrc:    Axillary  SpO2: 98% 98%  100%  Weight:      Height:        Intake/Output Summary (Last 24 hours) at 12/22/2019 1442 Last data filed at 12/22/2019 0646 Gross per 24 hour  Intake 507.77 ml  Output 250 ml  Net 257.77 ml   Filed Weights   12/15/19 1137 12/20/19 2314 12/21/19 0435  Weight: 56.7 kg 64.3 kg 65.2 kg    Examination:  General exam: Frail elderly lady with multiple ecchymosis, appears calm and comfortable with BiPAP on. Respiratory system: Harsh transmitted sounds bilaterally,. Respiratory effort normal. Cardiovascular system: S1 & S2 heard, RRR.  Gastrointestinal system: Soft, nontender, nondistended, bowel sounds positive. Central nervous system: Alert and oriented. No focal neurological deficits. Extremities: No edema, no cyanosis, pulses intact and symmetrical. Psychiatry: Judgement and insight appear normal.   DVT prophylaxis: Lovenox Code Status: DNR Family Communication: Son was updated at bedside. Disposition Plan:  Status is: Inpatient  Remains inpatient appropriate because:Ongoing diagnostic testing needed not appropriate for outpatient work up   Dispo: The patient is from: Home  Anticipated d/c is to: SNF with hospice services.              Anticipated d/c date is: 2 days              Patient currently is not medically stable.  Patient appears to back to her baseline today after sudden deterioration and transitioning to comfort care yesterday.  Family wants to proceed with thoracentesis which was originally planned for yesterday.  Consultants:   Oncology  Palliative care  Procedures:  Antimicrobials:    Data Reviewed: I have personally reviewed following labs and imaging studies  CBC: Recent Labs  Lab 12/16/19 0502 12/17/19 0352 12/18/19 0507 12/20/19 2111 12/21/19 0625  WBC 3.1* 4.2 3.7* 4.4 3.0*  HGB 16.4* 17.0* 15.2* 16.0* 13.6  HCT 47.8* 49.2* 42.0 46.4* 39.9  MCV 87.4 87.1 83.8 88.0 88.3  PLT 92* 89* 103* 99* 87*   Basic Metabolic Panel: Recent Labs  Lab 12/16/19 0502 12/17/19 0352 12/18/19 0507 12/19/19 0338 12/21/19 0625  NA 129* 127* 126* 128* 126*  K 4.4 4.9 4.8 4.2 4.3  CL 93* 92* 91* 93* 91*  CO2 _0 GLUCOSE 113* 111* 117* 110* 127*  BUN 16 25* 33* 25* 24*  CREATININE 0.85 1.01* 1.24* 0.84 0.81  CALCIUM 8.0* 8.3* 8.6* 8.4* 8.8*  MG 1.8 2.1 2.2  --   --   PHOS  --   --   --  2.5  --    GFR: Estimated Creatinine Clearance: 46.7 mL/min (by C-G formula based on SCr of 0.81 mg/dL). Liver Function Tests: Recent Labs  Lab 12/19/19 0338  ALBUMIN 2.7*   No results for input(s): LIPASE, AMYLASE in the last 168 hours. No results for input(s): AMMONIA in the last 168 hours. Coagulation Profile: Recent Labs  Lab 12/17/19 0352  INR 1.3*   Cardiac Enzymes: No results for input(s): CKTOTAL, CKMB, CKMBINDEX, TROPONINI in the last 168 hours. BNP (last 3 results) No results for input(s): PROBNP in the last 8760 hours. HbA1C: No results for input(s): HGBA1C in the last 72 hours. CBG: No results for input(s): GLUCAP in the last 168 hours. Lipid Profile: No results for input(s): CHOL, HDL, LDLCALC, TRIG, CHOLHDL, LDLDIRECT in the last 72 hours. Thyroid Function Tests: No results for input(s): TSH, T4TOTAL, FREET4, T3FREE, THYROIDAB in the last 72 hours. Anemia Panel: No results for input(s): VITAMINB12, FOLATE, FERRITIN, TIBC, IRON, RETICCTPCT in the last 72 hours. Sepsis Labs: Recent Labs  Lab 12/20/19 2111 12/20/19 2348  PROCALCITON <0.10  --   LATICACIDVEN 2.3* 2.0*    Recent Results (from the past 240 hour(s))  Urine culture      Status: None   Collection Time: 12/15/19 11:53 AM   Specimen: Urine, Random  Result Value Ref Range Status   Specimen Description   Final    URINE, RANDOM Performed at [facility], [address] [place], [place] [address]    Special Requests   Final    NONE Performed at [facility], [address] [place], [place] [address]    Culture   Final    NO GROWTH Performed at [facility], [place] [address], [place], [place] [address]    Report Status 12/16/2019 FINAL  Final  SARS Coronavirus 2 by RT PCR (hospital order, performed in [facility] hospital lab) Nasopharyngeal Nasopharyngeal Swab     Status: None   Collection Time: 12/15/19  2:38 PM   Specimen: Nasopharyngeal Swab  Result Value Ref Range Status   SARS Coronavirus 2 NEGATIVE NEGATIVE Final    Comment: (NOTE) SARS-CoV-2 target nucleic acids are NOT DETECTED.  The SARS-CoV-2 RNA is generally detectable in upper and lower respiratory specimens during the acute phase of infection. The lowest concentration of SARS-CoV-2 viral copies this assay can detect is 250 copies / mL. A negative result does not preclude SARS-CoV-2 infection and should not be used as the sole basis for treatment or other patient management decisions.  A negative result may occur with improper specimen collection / handling, submission of specimen other than nasopharyngeal swab, presence of viral mutation(s) within the areas targeted by this assay, and inadequate number of viral copies (<250 copies / mL). A negative result must be combined with clinical observations, patient history, and epidemiological information.  Fact Sheet for Patients:   StrictlyIdeas.no  Fact Sheet for Healthcare Providers: BankingDealers.co.za  This test is not yet approved or  cleared by the [place] FDA and has been authorized for detection and/or diagnosis of SARS-CoV-2 by FDA under an Emergency  Use Authorization (EUA).  This EUA will remain in effect (meaning this test can be used) for the duration of the COVID-19 declaration under Section 564(b)(1) of the Act, 21 U.S.C. section 360bbb-3(b)(1), unless the authorization is terminated or revoked sooner.  Performed at [facility], [address] [place], [place] [address]   Blood culture (routine x 2)     Status: None   Collection Time: 12/15/19  2:38 PM   Specimen: BLOOD  Result Value Ref Range Status   Specimen Description BLOOD LEFT ANTECUBITAL  Final   Special Requests   Final    BOTTLES DRAWN AEROBIC AND ANAEROBIC Blood Culture adequate volume   Culture   Final    NO GROWTH 5 DAYS Performed at [facility], [address] [place], [place] [address]    Report Status 12/20/2019 FINAL  Final  Blood culture (routine x 2)     Status: None   Collection Time: 12/15/19  2:38 PM   Specimen: BLOOD  Result Value Ref Range Status   Specimen Description BLOOD BLOOD RIGHT ARM  Final   Special Requests   Final    BOTTLES DRAWN AEROBIC AND ANAEROBIC Blood Culture adequate volume   Culture   Final    NO GROWTH 5 DAYS Performed at [facility], [address], [place], [place] [address]    Report Status 12/20/2019 FINAL  Final     Radiology Studies: DG Chest 1 View  Result Date: 12/20/2019 CLINICAL DATA:  Hypoxia. EXAM: CHEST  1 VIEW COMPARISON:  December 19, 2019 FINDINGS: Mild multifocal patchy infiltrates are seen throughout both lungs. Mild to moderate severity infiltrates are also noted within the bilateral lung bases. There is a very small right pleural effusion. A small to moderate size left pleural effusion is seen. No pneumothorax is identified. The cardiac silhouette is markedly enlarged. There is mild calcification of the aortic arch. Degenerative changes seen involving the thoracic spine. IMPRESSION: 1. Mild multifocal patchy infiltrates throughout both lungs. 2. Mild to moderate severity  bibasilar infiltrates. 3. Small to moderate size left pleural effusion. 4. Very small right pleural effusion. Electronically Signed   By: Virgina Norfolk M.D.   On: 12/20/2019 21:54   US Venous Img Upper Uni Right(DVT)  Result Date: 12/21/2019 CLINICAL DATA:  Right upper extremity edema. EXAM: RIGHT UPPER EXTREMITY VENOUS DOPPLER ULTRASOUND TECHNIQUE: Gray-scale sonography with graded compression, as well as color Doppler  and duplex ultrasound were performed to evaluate the upper extremity deep venous system from the level of the subclavian vein and including the jugular, axillary, basilic, radial, ulnar and upper cephalic vein. Spectral Doppler was utilized to evaluate flow at rest and with distal augmentation maneuvers. COMPARISON:  None. FINDINGS: Contralateral Subclavian Vein: Respiratory phasicity is normal and symmetric with the symptomatic side. No evidence of thrombus. Normal compressibility. Internal Jugular Vein: No evidence of thrombus. Normal compressibility, respiratory phasicity and response to augmentation. Subclavian Vein: No evidence of thrombus. Normal compressibility, respiratory phasicity and response to augmentation. Axillary Vein: No evidence of thrombus. Normal compressibility, respiratory phasicity and response to augmentation. Cephalic Vein: No evidence of thrombus. Normal compressibility, respiratory phasicity and response to augmentation. Basilic Vein: No evidence of thrombus. Normal compressibility, respiratory phasicity and response to augmentation. Brachial Veins: No evidence of thrombus. Normal compressibility, respiratory phasicity and response to augmentation. Radial Veins: No evidence of thrombus. Normal compressibility, respiratory phasicity and response to augmentation. Ulnar Veins: No evidence of thrombus. Normal compressibility, respiratory phasicity and response to augmentation. Venous Reflux:  None visualized. Other Findings:  None visualized. IMPRESSION: No evidence of  DVT within the right upper extremity. Electronically Signed   By: Marin Olp M.D.   On: 12/21/2019 08:49   ECHOCARDIOGRAM COMPLETE  Result Date: 12/21/2019    ECHOCARDIOGRAM REPORT   Patient Name:   ANQUINETTE PIERRO Date of Exam: 12/21/2019 Medical Rec #:  [id number]      Height:       66.0 in Accession #:    [id number]     Weight:       143.7 lb Date of Birth:  [birth year]       BSA:          1.738 m Patient Age:    84 years       BP:           90/63 mmHg Patient Gender: F              HR:           67 bpm. Exam Location:  [facility] Procedure: 2D Echo STAT ECHO Indications:     R/O Cardiac Tamponade  History:         Patient has prior history of Echocardiogram examinations.                  Pericardial Effusion, CA, Arrythmias:Atrial Fibrillation; Risk                  Factors:Hypertension.  Sonographer:     L Thornton-Maynard Referring Phys:  [id number] Kaladin Noseworthy Diagnosing Phys: Isaias Cowman MD IMPRESSIONS  1. Left ventricular ejection fraction, by estimation, is 25 to 30%. The left ventricle has severely decreased function. The left ventricle has no regional wall motion abnormalities. There is moderate left ventricular hypertrophy. Left ventricular diastolic parameters are indeterminate.  2. Right ventricular systolic function is normal. The right ventricular size is normal. There is normal pulmonary artery systolic pressure.  3. Large pericardial effusion. The pericardial effusion is circumferential. There is no evidence of cardiac tamponade. Moderate pleural effusion in the left lateral region.  4. The mitral valve is normal in structure. Moderate to severe mitral valve regurgitation. No evidence of mitral stenosis.  5. Tricuspid valve regurgitation is mild to moderate.  6. The aortic valve is normal in structure. Aortic valve regurgitation is not visualized. No aortic stenosis is present.  7. The inferior vena cava is normal in size with  greater than 50% respiratory variability, suggesting right atrial  pressure of 3 mmHg. FINDINGS  Left Ventricle: Left ventricular ejection fraction, by estimation, is 25 to 30%. The left ventricle has severely decreased function. The left ventricle has no regional wall motion abnormalities. The left ventricular internal cavity size was normal in size. There is moderate left ventricular hypertrophy. Left ventricular diastolic parameters are indeterminate. Right Ventricle: The right ventricular size is normal. No increase in right ventricular wall thickness. Right ventricular systolic function is normal. There is normal pulmonary artery systolic pressure. The tricuspid regurgitant velocity is 2.40 m/s, and  with an assumed right atrial pressure of 10 mmHg, the estimated right ventricular systolic pressure is 37.9 mmHg. Left Atrium: Left atrial size was normal in size. Right Atrium: Right atrial size was normal in size. Pericardium: A large pericardial effusion is present. The pericardial effusion is circumferential. There is no evidence of cardiac tamponade. Mitral Valve: The mitral valve is normal in structure. Normal mobility of the mitral valve leaflets. Moderate to severe mitral valve regurgitation. No evidence of mitral valve stenosis. MV peak gradient, 3.4 mmHg. The mean mitral valve gradient is 2.0 mmHg. Tricuspid Valve: The tricuspid valve is normal in structure. Tricuspid valve regurgitation is mild to moderate. No evidence of tricuspid stenosis. Aortic Valve: The aortic valve is normal in structure. Aortic valve regurgitation is not visualized. No aortic stenosis is present. Aortic valve mean gradient measures 2.0 mmHg. Aortic valve peak gradient measures 3.1 mmHg. Aortic valve area, by VTI measures 2.10 cm. Pulmonic Valve: The pulmonic valve was normal in structure. Pulmonic valve regurgitation is not visualized. No evidence of pulmonic stenosis. Aorta: The aortic root is normal in size and structure. Venous: The inferior vena cava is normal in size with greater than 50%  respiratory variability, suggesting right atrial pressure of 3 mmHg. IAS/Shunts: No atrial level shunt detected by color flow Doppler. Additional Comments: There is a moderate pleural effusion in the left lateral region.  LEFT VENTRICLE PLAX 2D LVIDd:         3.64 cm  Diastology LVIDs:         3.19 cm  LV e' lateral:   3.94 cm/s LV PW:         1.53 cm  LV E/e' lateral: 21.6 LV IVS:        1.92 cm  LV e' medial:    3.62 cm/s LVOT diam:     2.30 cm  LV E/e' medial:  23.6 LV SV:         32 LV SV Index:   18 LVOT Area:     4.15 cm  RIGHT VENTRICLE RV S prime:     6.62 cm/s TAPSE (M-mode): 1.7 cm LEFT ATRIUM             Index       RIGHT ATRIUM           Index LA diam:        3.90 cm 2.24 cm/m  RA Area:     21.40 cm LA Vol (A2C):   75.0 ml 43.16 ml/m RA Volume:   63.70 ml  36.65 ml/m LA Vol (A4C):   57.8 ml 33.26 ml/m LA Biplane Vol: 66.7 ml 38.38 ml/m  AORTIC VALVE AV Area (Vmax):    2.10 cm AV Area (Vmean):   2.01 cm AV Area (VTI):     2.10 cm AV Vmax:           87.90 cm/s AV Vmean:  68.100 cm/s AV VTI:            0.151 m AV Peak Grad:      3.1 mmHg AV Mean Grad:      2.0 mmHg LVOT Vmax:         44.40 cm/s LVOT Vmean:        33.000 cm/s LVOT VTI:          0.076 m LVOT/AV VTI ratio: 0.51  AORTA Ao Root diam: 3.20 cm MITRAL VALVE                 TRICUSPID VALVE MV Area (PHT): 3.42 cm      TR Peak grad:   23.0 mmHg MV Peak grad:  3.4 mmHg      TR Vmax:        240.00 cm/s MV Mean grad:  2.0 mmHg MV Vmax:       0.93 m/s      SHUNTS MV Vmean:      61.9 cm/s     Systemic VTI:  0.08 m MV Decel Time: 222 msec      Systemic Diam: 2.30 cm MR Peak grad:    59.6 mmHg MR Mean grad:    36.0 mmHg MR Vmax:         386.00 cm/s MR Vmean:        284.0 cm/s MR PISA:         2.26 cm MR PISA Eff ROA: 27 mm MR PISA Radius:  0.60 cm MV E velocity: 85.30 cm/s Isaias Cowman MD Electronically signed by Isaias Cowman MD Signature Date/Time: 12/21/2019/10:54:40 AM    Final     Scheduled Meds: . feeding supplement  (ENSURE ENLIVE)  237 mL Oral BID BM  . latanoprost  1 drop Both Eyes QHS  . LORazepam  0.5 mg Intravenous Once  . timolol  1 drop Both Eyes Daily   Continuous Infusions: . morphine       LOS: 7 days   Time spent: 40 minutes.  Lorella Nimrod, MD [facility]  If 7PM-7AM, please contact night-coverage [email or web address]  12/22/2019, 2:42 PM   This record has been created using Systems analyst. Errors have been sought and corrected,but may not always be located. Such creation errors do not reflect on the standard of care.

## 2019-12-23 DIAGNOSIS — Z7189 Other specified counseling: Secondary | ICD-10-CM

## 2019-12-23 LAB — QUANTIFERON-TB GOLD PLUS (RQFGPL)
QuantiFERON Mitogen Value: 6.13 IU/mL
QuantiFERON Nil Value: 1.68 IU/mL
QuantiFERON TB1 Ag Value: 1.67 IU/mL
QuantiFERON TB2 Ag Value: 1.66 IU/mL

## 2019-12-23 LAB — QUANTIFERON-TB GOLD PLUS: QuantiFERON-TB Gold Plus: NEGATIVE

## 2019-12-23 MED ORDER — ENSURE ENLIVE PO LIQD
237.0000 mL | Freq: Three times a day (TID) | ORAL | Status: DC
  Administered 2019-12-23 – 2019-12-25 (×4): 237 mL via ORAL

## 2019-12-23 NOTE — Consult Note (Signed)
Reason for Consult: Positive anti-DNA  Referring Physician: Dr. Barron Alvine   HPI: 86 year old white female.  Prior Engineer, manufacturing systems.  Complicated history.  Shortness of breath 2 to 3 years ago.  Found to have large pericardial effusion and pleural effusions.  Had thoracentesis.  Not diagnostic.  At that time she had normal sedimentation rate.  Positive ANA.  Anti-DNA 52.  Was evaluated by Dr. Meda Coffee of rheumatology.  Not thought to have connective tissue disease.  She was treated for atrial fibrillation and component of congestive heart failure with decreased ejection fraction Since then she has lost about 100 pounds of weight.  More recently she had episodes of confusion.  Labs were not significantly abnormal.  UTI.  Had antibiotic.  Then had episode of hemoptysis with blood tinge in the sputum.  Some cough.  Came to emergency room where she had to be placed on BiPAP.  Thought to have initially pneumonia with chest x-ray showing some infiltrates versus nodules.  CT was more consistent with nodules consistent with malignancy.  CT of the abdomen showed liver lesions.  MRI of the abdomen showed lesions consistent with malignancy metastatic but no intra-abdominal mass or metastatic static disease She has had recurrent pericardial effusion aspirated.  Appears to be transudate not exudate.  Cytology pending.  Had repeat labs.  White count 3000 but normal hemoglobin.  Platelets 82,000.  Sodium 126 with normal creatinine.  No significant proteinuria.  Mildly elevated BNP. Inflammatory markers with sed rate 1.  Rheumatoid factor negative.  ACE 110+ ANA and anti-DNA 40 She is able to converse with me and says she has not had Raynaud's or joint pain.  Son confirms that.  She is not had significant abdominal pain.  Prior colonoscopy.  Prior mammogram.  No significant muscle weakness.  No photosensitive rashes.  PMH: Pericardial effusion.  Pleural effusion.  Diabetes.  Atrial fibrillation  SURGICAL  HISTORY: Wrist carpal tunnel surgery  Family History: No family history of connective tissue disease  Social History: No significant cigarettes or alcohol  Allergies: No Known Allergies  Medications:  Scheduled: . feeding supplement (ENSURE ENLIVE)  237 mL Oral TID BM  . latanoprost  1 drop Both Eyes QHS  . LORazepam  0.5 mg Intravenous Once  . timolol  1 drop Both Eyes Daily        ROS: No chest pain.  No abdominal pain.  Has had falls.  Ecchymosis with falls affecting the knee and the face.  No significant joint pains   PHYSICAL EXAM: Blood pressure (!) 102/58, pulse 83, temperature (!) 97.5 F (36.4 C), temperature source Axillary, resp. rate 19, height 5\' 6"  (1.676 m), weight 65.2 kg, SpO2 95 %. Pleasant female.  No acute distress.  Seen in bed.  Large ecchymosis left face.  Left knee without effusion.  Sclera clear.  Clear chest.  Regular rhythm without significant murmur.  Nontender but protuberant abdomen.  1+ edema Musculoskeletal good range of motion of cervical spine shoulders hands without synovitis or sclerodactyly.  Hips move well.  No knee effusions  Assessment: 100 pound weight loss with pulmonary and liver logic nodules most consistent with malignancy radiographically  Positive anti-DNA of 40 up from 29 from 2 years ago.  Little suspicion of lupus or connective tissue disease  Elevated ACE.  Cannot completely rule out sarcoid but has normal sedimentation rate and radiographic lesions that are most consistent with malignancy  Recent delirium, improved  Recent hypoxemia, improved  Recommendations: Pending cytology  No indication for steroids or immunotherapy We will leave it to oncology and her regular physicians whether to pursue lung or liver biopsy  Emmaline Kluver 12/23/2019, 6:19 PM

## 2019-12-23 NOTE — Plan of Care (Signed)

## 2019-12-23 NOTE — Progress Notes (Signed)
Daily Progress Note   Patient Name: Autumn Bradley       Date: 12/23/2019 DOB: 09-Apr-1932  Age: 86 y.o. MRN#: [id number] Attending Physician: Lorella Nimrod, MD Primary Care Physician: Adin Hector, MD Admit Date: 12/15/2019  Reason for Consultation/Follow-up: Establishing goals of care  Subjective: Patient is resting in bed. She is conversive. Son and DIL who are HPOA's are at bedside. Patient states she feels better since the thoracentesis. Per family, her appetite has improved today but patient intermittenlty has not wanted to wear her nasal cannula. They advise they have worked to get her to eat more, and to wear her O2.     Discussed her decline since Mother's Day with functional status and oral intake. They state she began to have hallucinations, and she has spent most of her time in bed or in chair watching t.v. She performs ADL's and her daughter helps her wash her hair.   We discussed the discussions about her diagnoses, prognosis, GOC, EOL wishes disposition and options.  A detailed discussion was had today regarding advanced directives.  Concepts specific to code status, artifical feeding and hydration, IV antibiotics and rehospitalization were discussed.  The difference between an aggressive medical intervention path and a comfort care path was discussed.  Values and goals of care important to patient and family were attempted to be elicited.  Discussed limitations of medical interventions to prolong quality of life in some situations and discussed the concept of human mortality.  Patient states she does not want to go through a lot and states if a problem can be found and quickly treated, she would want this, but if not "let me go." She states if she becomes bed bound or has pain  she would want to focus on comfort measures. Family discusses their conversations with the specialists. Patient tells me she would like further work up and testing, but would not want a biopsy. She does not think she would want oncologic interventions if those were indicated. She states she would want to be treated by rheumatology if treatment was recommended. She wants to return to the hospital and treat the treatable as needed. She confirms DNR/DNI status. She states she would not want a feeding tube.   HPOA's state they want what is best for Ms. Bye, and want to  honor her wishes. They tell me they do not want to try to force her to eat or to wear her O2 if she does not want to do so.       Length of Stay: 8  Current Medications: Scheduled Meds:   feeding supplement (ENSURE ENLIVE)  237 mL Oral BID BM   latanoprost  1 drop Both Eyes QHS   LORazepam  0.5 mg Intravenous Once   timolol  1 drop Both Eyes Daily    Continuous Infusions:  morphine      PRN Meds: acetaminophen **OR** acetaminophen, antiseptic oral rinse, glycopyrrolate **OR** glycopyrrolate **OR** glycopyrrolate, haloperidol **OR** haloperidol **OR** haloperidol lactate, levalbuterol, morphine injection, morphine, ondansetron **OR** ondansetron (ZOFRAN) IV, polyvinyl alcohol  Physical Exam Pulmonary:     Effort: Pulmonary effort is normal.  Neurological:     Mental Status: She is alert.             Vital Signs: BP 116/72 (BP Location: Left Arm)    Pulse 98    Temp (!) 97.5 F (36.4 C) (Axillary)    Resp 18    Ht 5\' 6"  (1.676 m)    Wt 65.2 kg    SpO2 99%    BMI 23.20 kg/m  SpO2: SpO2: 99 % O2 Device: O2 Device: Nasal Cannula O2 Flow Rate: O2 Flow Rate (L/min): 3 L/min  Intake/output summary:   Intake/Output Summary (Last 24 hours) at 12/23/2019 1500 Last data filed at 12/23/2019 0604 Gross per 24 hour  Intake --  Output 1150 ml  Net -1150 ml   LBM: Last BM Date:  (Last BM unknown) Baseline Weight: Weight:  56.7 kg Most recent weight: Weight: 65.2 kg       Palliative Assessment/Data:      Patient Active Problem List   Diagnosis Date Noted   Poor fluid intake    Protein-calorie malnutrition, severe 12/16/2019   Pulmonary nodules    Failure to thrive (0-17)    DNR (do not resuscitate)    AMS (altered mental status) 12/15/2019   Recurrent left pleural effusion    Goals of care, counseling/discussion    Palliative care by specialist    CHF (congestive heart failure) ([place]) 07/07/2018    Palliative Care Assessment & Plan    Recommendations/Plan:  Palliative will continue to follow. Waiting for additional consults to further discuss overall plan.     Code Status:    Code Status Orders  (From admission, onward)         Start     Ordered   12/21/19 1351  Do not attempt resuscitation (DNR)  Continuous       Question Answer Comment  In the event of cardiac or respiratory ARREST Do not call a code blue   In the event of cardiac or respiratory ARREST Do not perform Intubation, CPR, defibrillation or ACLS   In the event of cardiac or respiratory ARREST Use medication by any route, position, wound care, and other measures to relive pain and suffering. May use oxygen, suction and manual treatment of airway obstruction as needed for comfort.      12/21/19 1353        Code Status History    Date Active Date Inactive Code Status Order ID Comments User Context   12/15/2019 1645 12/21/2019 1353 DNR [id number]  Enzo Bi, MD Inpatient   07/07/2018 1014 07/11/2018 1736 DNR [id number]  Hillary Bow, MD ED   07/07/2018 0907 07/07/2018 1014 Full Code [id number]  Hillary Bow, MD  ED   Advance Care Planning Activity       Prognosis:   Unable to determine   Thank you for allowing the Palliative Medicine Team to assist in the care of this patient.   Time In: 2: 10 Time Out: 3:00 Total Time 50 min Prolonged Time Billed no      Greater than 50%  of this time was spent  counseling and coordinating care related to the above assessment and plan.  Asencion Gowda, NP  Please contact Palliative Medicine Team phone at [phone] for questions and concerns.

## 2019-12-23 NOTE — Progress Notes (Signed)
Nutrition Follow-up  DOCUMENTATION CODES:   Severe malnutrition in context of chronic illness  INTERVENTION:  Increase to Ensure Enlive po TID, each supplement provides 350 kcal and 20 grams of protein.  Continue Magic cup TID with meals, each supplement provides 290 kcal and 9 grams of protein.  NUTRITION DIAGNOSIS:   Severe Malnutrition related to chronic illness (CHF) as evidenced by severe fat depletion, severe muscle depletion.  Ongoing.  GOAL:   Patient will meet greater than or equal to 90% of their needs  Not met.  MONITOR:   PO intake, Supplement acceptance, Labs, Weight trends, I & O's  REASON FOR ASSESSMENT:   Malnutrition Screening Tool    ASSESSMENT:   86 year old female with PMHx of A-fib, HTN, systolic CHF, pleural effusions and pericardial effusion admitted with AMS, FTT, nodular opacities in lungs and liver concerning for metastatic disease, hyponatremia.   -Patient s/p left thoracentesis on 6/21 that yielded 900 mL of fluid.  Met with patient and her family members at bedside. Patient reports her appetite is good and she is eating most of her meals but per family she is only having bites at most meals. Occasionally she is able to eat more. For dinner last night she had 1/2 of a sandwich (150 kcal, 6 grams of protein). Today she had bites of food brought in from home. Patient drank one bottle of Ensure today. She is also having bites of Magic Cup. Family would like Ensure offered TID.  Noted family has been meeting with Palliative Medicine to discuss goals of care. Patient had transitioned to comfort care but then patient became more alert so family decided to proceed with thoracentesis. They do not want a feeding tube.  Medications reviewed.  Labs reviewed.  Diet Order:   Diet Order            Diet regular Room service appropriate? Yes; Fluid consistency: Thin  Diet effective now                EDUCATION NEEDS:   No education needs have been  identified at this time  Skin:  Skin Assessment: Reviewed RN Assessment  Last BM:  12/18/2019 - small type 6  Height:   Ht Readings from Last 1 Encounters:  12/15/19 _0  (1.676 m)   Weight:   Wt Readings from Last 1 Encounters:  12/21/19 65.2 kg   Ideal Body Weight:  59.1 kg  BMI:  Body mass index is 23.2 kg/m.  Estimated Nutritional Needs:   Kcal:  1700-1900  Protein:  85-95 grams  Fluid:  1.6 L/day  Jacklynn Barnacle, MS, RD, LDN Pager number available on Amion

## 2019-12-23 NOTE — TOC Progression Note (Signed)
Transition of Care [facility]) - Progression Note    Patient Details  Name: Autumn Bradley MRN: [id number] Date of Birth: [birth year]  [facility] CM/SW Contact  Mercede Rollo, Gardiner Rhyme, LCSW Phone Number: 12/23/2019, 9:49 AM  Clinical Narrative:  Met with pt, daughter and daughter in-law in the room to see how she is doing. Pt looks better and is alert and talking, she says she feels better now she has less fluid from her procedure yesterday. Will continue to follow and work on discharge needs once appropriate.    Expected Discharge Plan: Home/Self Care Barriers to Discharge: Continued Medical Work up  Expected Discharge Plan and Services Expected Discharge Plan: Home/Self Care In-house Referral: Clinical Social Work     Living arrangements for the past 2 months: Single Family Home                                       Social Determinants of Health (SDOH) Interventions    Readmission Risk Interventions No flowsheet data found.

## 2019-12-23 NOTE — Progress Notes (Signed)
PROGRESS NOTE    Autumn Bradley  UKG:[id number] DOB: June 09, 1932 DOA: 12/15/2019 PCP: Adin Hector, MD   Brief Narrative:  Autumn Bradley is a 86 y.o. Caucasian female with medical history significant of systolic CHF, Afib on Eliquis, pleural effusions and pericardial effusion who presented with a myriad of complaints. Basically a failure to thrive and declining health.  There was some concern of some confusion and hallucinations over the past 1 month.  CT chest with multiple nodular opacities bilaterally in lung and liver, concern for metastatic disease with unknown primary.   Patient developed acute respiratory failure overnight requiring BiPAP during the night prior to 12/21/2019.  During the day on 20th she decompensated further, becoming hypoxic on BiPAP, bradycardic and hypotensive not really responding to IV fluid.  There was some concern of cardiac tamponade and cardiology was also consulted.  Echo without any sign of tamponade and with persistent of large pericardial effusion.  Family decided to transition her to comfort care.  Comfort care was initiated and she was given some morphine before removing BiPAP.  Patient remained unresponsive for some time and then regained consciousness.  By night she was up and talking with the family.  She had her thoracentesis done on 12/22/2019 which appears transudate.  Cytology pending.    Subjective: Patient was feeling better when seen today.  She was sitting upright and talking with her family.  Per patient her breathing has been improved after thoracentesis.  Denies any pain or shortness of breath.  Assessment & Plan:   Active Problems:   AMS (altered mental status)   Pulmonary nodules   Failure to thrive (0-17)   DNR (do not resuscitate)   Protein-calorie malnutrition, severe   Poor fluid intake  Acute respiratory failure.  Resolved. On 12/21/19. she was transitioned to comfort measures after discussing with family due to acute  deterioration.  She gave another surprise after taking her off from BiPAP.  Couple of hours later she becomes more responsive. She appears to be at her baseline.  Had her thoracentesis with removal of 900 cc pleural fluid.  Preliminary labs are consistent with transudate.  Altered mental status/failure to thrive/falls.  Appears to be at her baseline now. Per family concern patient is becoming confused and hallucinations at times.  She was alert and oriented x3 with me.  Able to answer all the questions appropriately.  Patient has a progressive decline over the past year.  She recently lost her son due to lymphoma. -Delirium precautions. -PT/OT evaluation-pending SNF placement. -She will go to SNF with hospice services/hospice home.  Nodular opacities in lungs and liver Weight loss --concerning for metastatic disease.  Patient and her daughter-in-law was not aware of any prior diagnosis of any type of cancer.  She does not want very aggressive measures for diagnosis or treatment.  Daughter-in-law also wants to involve oncology for a second opinion.  She was also agreeable to see a palliative care for symptom management, as she wants patient to remain comfortable. -CT abdomen and pelvis-with numerous hepatic masses, advising dedicated liver MRI.  Small amount of free fluid within abdomen and pelvis. -Consult oncology-Dr. Mike Gip will review her imaging with IR and will come up some plan for definitive diagnosis.  Patient's family wants diagnosis but might not consider chemotherapy if it is a metastatic disease.  -MRI liver-with multiple liver lesions highly suspicious for metastatic disease. -Consult palliative care. -Dr. Raul Del, her pulmonologist was consulted by oncology. She is not a good  candidate for bronchoscopy.  Thoracentesis was performed yesterday which is more consistent with transudate.  Cytology results are pending.  Pulmonary sent some rheumatologic labs which came back positive for ANA  and double-stranded DNA, angiotensin-converting enzymes elevated at 110.  There is some concern of granulomatous disease.  They are recommending rheumatologic evaluation. -PET Scan can be considered as an outpatient.  AKI.  Resolved  -Continue to monitor. -Avoid nephrotoxins  Hyponatremia.  It was thought to be due to poor p.o. intake.  Sodium remains between 1 26-1 28 despite giving some fluid.  Hyponatremia labs with mildly elevated TSH and other concerning for SIADH. -Continue IV fluid-at a lower rate as patient is on BiPAP now and we will try to avoid volume overload. -Continue to monitor sodium. -Check hyponatremia labs.  Chronic cough.  No leukocytosis, patient remained afebrile.  Procalcitonin negative.  Patient recently finished Z-Pak with no improvement.  There is concern for malignancy.  She was given a dose of ceftriaxone and azithromycin in the ED which was not continued. -Supportive care with cough suppressant.  Bilateral pleural effusions, chronic --L>R.  Left side probably loculated.  Had left thoracentesis back on 07/08/18.   --Pulmonary is recommending repeat thoracentesis if there is a free-floating fluid on decubitus chest x-ray. -thoracentesis done yesterday, labs consistent with transudate.  Goal of 900 cc.  Large pericardial effusion, chronic, stable -Known condition in Jan 2020, appeared to be stable since then. -There was some concern of cardiac tamponade secondary to acutely decompensated respiratory function hypertension.  Repeat echogram with persistent large pericardial effusion but there is no current sign of tamponade.  Afib on Eliquis -currently rate controlled.   -Holding home dose of Eliquis in case patient decided to go for biopsy.  Hx of chronic systolic CHF with LVEF 20-25% -BNP 781, but does not appear fluid overloaded.  Only on Lasix 20 mg PRN at home. -Hold diuretic since BP low.  Glaucoma -continue home eye drops  Objective: Vitals:    12/22/19 1542 12/22/19 1603 12/23/19 0700 12/23/19 1100  BP: 125/80 134/69    Pulse: 85 85    Resp: 18 18    Temp:      TempSrc:      SpO2: 100% 100% 100% 99%  Weight:      Height:        Intake/Output Summary (Last 24 hours) at 12/23/2019 1301 Last data filed at 12/23/2019 0604 Gross per 24 hour  Intake --  Output 1150 ml  Net -1150 ml   Filed Weights   12/15/19 1137 12/20/19 2314 12/21/19 0435  Weight: 56.7 kg 64.3 kg 65.2 kg    Examination:  General exam: Frail elderly lady with multiple ecchymosis, appears calm and comfortable with BiPAP on. Respiratory system: Harsh transmitted sounds bilaterally,. Respiratory effort normal. Cardiovascular system: S1 & S2 heard, RRR.  Gastrointestinal system: Soft, nontender, nondistended, bowel sounds positive. Central nervous system: Alert and oriented. No focal neurological deficits. Extremities: No edema, no cyanosis, pulses intact and symmetrical. Psychiatry: Judgement and insight appear normal.   DVT prophylaxis: Lovenox Code Status: DNR Family Communication: Son was updated at bedside. Disposition Plan:  Status is: Inpatient  Remains inpatient appropriate because:Ongoing diagnostic testing needed not appropriate for outpatient work up   Dispo: The patient is from: Home              Anticipated d/c is to: SNF with hospice services/home with hospice.              Anticipated  d/c date is: 1-2 days              Patient currently is not medically stable.  Patient appears to back to her baseline today after sudden deterioration and transitioning to comfort care yesterday.  Call palliative again to finalize the plan for disposition.  As family is very confused and unable to make some definitive decisions.  Consultants:   Oncology  Palliative care  Pulmonology  Procedures:  Antimicrobials:   Data Reviewed: I have personally reviewed following labs and imaging studies  CBC: Recent Labs  Lab 12/17/19 0352 12/18/19 0507  12/20/19 2111 12/21/19 0625  WBC 4.2 3.7* 4.4 3.0*  HGB 17.0* 15.2* 16.0* 13.6  HCT 49.2* 42.0 46.4* 39.9  MCV 87.1 83.8 88.0 88.3  PLT 89* 103* 99* 87*   Basic Metabolic Panel: Recent Labs  Lab 12/17/19 0352 12/18/19 0507 12/19/19 0338 12/21/19 0625  NA 127* 126* 128* 126*  K 4.9 4.8 4.2 4.3  CL 92* 91* 93* 91*  CO2 '23 25 28 23  '$ GLUCOSE 111* 117* 110* 127*  BUN 25* 33* 25* 24*  CREATININE 1.01* 1.24* 0.84 0.81  CALCIUM 8.3* 8.6* 8.4* 8.8*  MG 2.1 2.2  --   --   PHOS  --   --  2.5  --    GFR: Estimated Creatinine Clearance: 46.7 mL/min (by C-G formula based on SCr of 0.81 mg/dL). Liver Function Tests: Recent Labs  Lab 12/19/19 0338  ALBUMIN 2.7*   No results for input(s): LIPASE, AMYLASE in the last 168 hours. No results for input(s): AMMONIA in the last 168 hours. Coagulation Profile: Recent Labs  Lab 12/17/19 0352  INR 1.3*   Cardiac Enzymes: No results for input(s): CKTOTAL, CKMB, CKMBINDEX, TROPONINI in the last 168 hours. BNP (last 3 results) No results for input(s): PROBNP in the last 8760 hours. HbA1C: No results for input(s): HGBA1C in the last 72 hours. CBG: No results for input(s): GLUCAP in the last 168 hours. Lipid Profile: No results for input(s): CHOL, HDL, LDLCALC, TRIG, CHOLHDL, LDLDIRECT in the last 72 hours. Thyroid Function Tests: No results for input(s): TSH, T4TOTAL, FREET4, T3FREE, THYROIDAB in the last 72 hours. Anemia Panel: No results for input(s): VITAMINB12, FOLATE, FERRITIN, TIBC, IRON, RETICCTPCT in the last 72 hours. Sepsis Labs: Recent Labs  Lab 12/20/19 2111 12/20/19 2348  PROCALCITON <0.10  --   LATICACIDVEN 2.3* 2.0*    Recent Results (from the past 240 hour(s))  Urine culture     Status: None   Collection Time: 12/15/19 11:53 AM   Specimen: Urine, Random  Result Value Ref Range Status   Specimen Description   Final    URINE, RANDOM Performed at [facility], [address], [place], [place]  [address]    Special Requests   Final    NONE Performed at [facility], [address] [place], [place] [address]    Culture   Final    NO GROWTH Performed at [facility], [place] [address] [place], [place] [address]    Report Status 12/16/2019 FINAL  Final  SARS Coronavirus 2 by RT PCR (hospital order, performed in [facility] hospital lab) Nasopharyngeal Nasopharyngeal Swab     Status: None   Collection Time: 12/15/19  2:38 PM   Specimen: Nasopharyngeal Swab  Result Value Ref Range Status   SARS Coronavirus 2 NEGATIVE NEGATIVE Final    Comment: (NOTE) SARS-CoV-2 target nucleic acids are NOT DETECTED.  The SARS-CoV-2 RNA is generally  detectable in upper and lower respiratory specimens during the acute phase of infection. The lowest concentration of SARS-CoV-2 viral copies this assay can detect is 250 copies / mL. A negative result does not preclude SARS-CoV-2 infection and should not be used as the sole basis for treatment or other patient management decisions.  A negative result may occur with improper specimen collection / handling, submission of specimen other than nasopharyngeal swab, presence of viral mutation(s) within the areas targeted by this assay, and inadequate number of viral copies (<250 copies / mL). A negative result must be combined with clinical observations, patient history, and epidemiological information.  Fact Sheet for Patients:   StrictlyIdeas.no  Fact Sheet for Healthcare Providers: BankingDealers.co.za  This test is not yet approved or  cleared by the [place] FDA and has been authorized for detection and/or diagnosis of SARS-CoV-2 by FDA under an Emergency Use Authorization (EUA).  This EUA will remain in effect (meaning this test can be used) for the duration of the COVID-19 declaration under Section 564(b)(1) of the Act, 21 U.S.C. section 360bbb-3(b)(1), unless the authorization is  terminated or revoked sooner.  Performed at [facility], [address] [place], [place] [address]   Blood culture (routine x 2)     Status: None   Collection Time: 12/15/19  2:38 PM   Specimen: BLOOD  Result Value Ref Range Status   Specimen Description BLOOD LEFT ANTECUBITAL  Final   Special Requests   Final    BOTTLES DRAWN AEROBIC AND ANAEROBIC Blood Culture adequate volume   Culture   Final    NO GROWTH 5 DAYS Performed at [facility], [address] [place], [place] [address]    Report Status 12/20/2019 FINAL  Final  Blood culture (routine x 2)     Status: None   Collection Time: 12/15/19  2:38 PM   Specimen: BLOOD  Result Value Ref Range Status   Specimen Description BLOOD BLOOD RIGHT ARM  Final   Special Requests   Final    BOTTLES DRAWN AEROBIC AND ANAEROBIC Blood Culture adequate volume   Culture   Final    NO GROWTH 5 DAYS Performed at [facility], [address], [place], [place] [address]    Report Status 12/20/2019 FINAL  Final  Body fluid culture     Status: None (Preliminary result)   Collection Time: 12/22/19  4:00 PM   Specimen: PATH Cytology Pleural fluid  Result Value Ref Range Status   Specimen Description FLUID PLEURAL  Final   Special Requests NONE  Final   Gram Stain   Final    RARE WBC PRESENT, PREDOMINANTLY MONONUCLEAR NO ORGANISMS SEEN    Culture   Final    NO GROWTH < 12 HOURS Performed at [facility], [place] [address] [place], [place] [address]    Report Status PENDING  Incomplete     Radiology Studies: DG Chest Port 1 View  Result Date: 12/22/2019 CLINICAL DATA:  S/p thoracentesis on the left EXAM: PORTABLE CHEST - 1 VIEW COMPARISON:  12/20/2019 FINDINGS: Significant resolution of left effusion. No pneumothorax. Improved aeration at the left lung base. Persistent airspace opacity medially in the left upper lung. Persistent small right pleural effusion with patchy airspace opacities medially at the  right lung base. Persistent cardiomegaly.  Aortic Atherosclerosis (ICD10-170.0). Visualized bones unremarkable. IMPRESSION: 1. No pneumothorax post left thoracentesis. 2. Improved aeration at the left lung base. Electronically Signed   By: [place] Emms.D.  On: 12/22/2019 16:37   US THORACENTESIS ASP PLEURAL SPACE W/IMG GUIDE  Result Date: 12/22/2019 INDICATION: Hypoxia, pleural effusions left greater than right EXAM: ULTRASOUND GUIDED LEFT THORACENTESIS MEDICATIONS: Lidocaine 1% subcutaneous COMPLICATIONS: None immediate. PROCEDURE: An ultrasound guided thoracentesis was thoroughly discussed with the patient and questions answered. The benefits, risks, alternatives and complications were also discussed. The patient understands and wishes to proceed with the procedure. Written consent was obtained. Ultrasound was performed to localize and mark an adequate pocket of fluid in the left chest. The area was then prepped and draped in the normal sterile fashion. 1% Lidocaine was used for local anesthesia. Under ultrasound guidance a 6 Fr Safe-T-Centesis catheter was introduced. Thoracentesis was performed. The catheter was removed and a dressing applied. FINDINGS: A total of approximately 900 mL of amber fluid was removed. IMPRESSION: Successful ultrasound guided left thoracentesis yielding 900 mL of pleural fluid. No pneumothorax on follow-up radiograph. Electronically Signed   By: Lucrezia Europe M.D.   On: 12/22/2019 16:34    Scheduled Meds: . feeding supplement (ENSURE ENLIVE)  237 mL Oral BID BM  . latanoprost  1 drop Both Eyes QHS  . LORazepam  0.5 mg Intravenous Once  . timolol  1 drop Both Eyes Daily   Continuous Infusions: . morphine       LOS: 8 days   Time spent: 40 minutes.  Lorella Nimrod, MD [facility]  If 7PM-7AM, please contact night-coverage [email or web address]  12/23/2019, 1:01 PM   This record has been created using Systems analyst. Errors have been sought and  corrected,but may not always be located. Such creation errors do not reflect on the standard of care.

## 2019-12-23 NOTE — Progress Notes (Addendum)
Date: 12/23/2019,   MRN# [id number] Autumn Bradley September 29, 1931 Code Status:     Code Status Orders  (From admission, onward)         Start     Ordered   12/21/19 1351  Do not attempt resuscitation (DNR)  Continuous       Question Answer Comment  In the event of cardiac or respiratory ARREST Do not call a "code blue"   In the event of cardiac or respiratory ARREST Do not perform Intubation, CPR, defibrillation or ACLS   In the event of cardiac or respiratory ARREST Use medication by any route, position, wound care, and other measures to relive pain and suffering. May use oxygen, suction and manual treatment of airway obstruction as needed for comfort.      12/21/19 1353        Code Status History    Date Active Date Inactive Code Status Order ID Comments User Context   12/15/2019 1645 12/21/2019 1353 DNR [id number]  Autumn Bi, MD Inpatient   07/07/2018 1014 07/11/2018 1736 DNR [id number]  Autumn Bow, MD ED   07/07/2018 0907 07/07/2018 1014 Full Code [id number]  Autumn Bow, MD ED   [place] day:@LENGTHOFSTAYDAYS @ Referring MD: @ATDPROV @     PCP:      AdmissionWeight: 56.7 kg                 CurrentWeight: 65.2 kg  HPI: brighter day, less coughing, breathing easier. Family at bed side, long conversation. Updated on results and plan. See last notes.  PMHX:   Past Medical History:  Diagnosis Date  . Atrial fibrillation ([place])   . Cancer [facility])    SKIN CANCER  . Degenerative joint disease   . Glaucoma   . Hypercholesteremia   . Hypertension    CONTROLLED ON MEDS  . Shortness of breath dyspnea    [place]  . Wears dentures    partial upper and lower   Surgical Hx:  Past Surgical History:  Procedure Laterality Date  . ABDOMINAL HYSTERECTOMY    . CARDIAC CATHETERIZATION  07/2012   [facility] - Neg for blockages  . CARDIAC ELECTROPHYSIOLOGY STUDY AND ABLATION    . CATARACT EXTRACTION W/PHACO Left 03/17/2015   Procedure: CATARACT EXTRACTION PHACO  AND INTRAOCULAR LENS PLACEMENT (IOC);  Surgeon: Autumn Koyanagi, MD;  Location: [place];  Service: Ophthalmology;  Laterality: Left;  . CATARACT EXTRACTION W/PHACO Right 04/14/2015   Procedure: CATARACT EXTRACTION PHACO AND INTRAOCULAR LENS PLACEMENT (IOC);  Surgeon: Autumn Koyanagi, MD;  Location: [place];  Service: Ophthalmology;  Laterality: Right;  . CHOLECYSTECTOMY    . COLONOSCOPY    . TONSILLECTOMY     Family Hx:  Family History  Problem Relation Age of Onset  . Diabetes Mellitus II Mother   . Heart failure Father   . COPD Sister   . Heart attack Sister   . Heart failure Sister   . Colon cancer Brother   . Kidney cancer Brother   . Lung cancer Brother    Social Hx:   Social History   Tobacco Use  . Smoking status: Never Smoker  . Smokeless tobacco: Never Used  Substance Use Topics  . Alcohol use: No  . Drug use: Not on file   Medication:    Home Medication:    Current Medication: @CURMEDTAB @   Allergies:  Patient has no known allergies.  Review of Systems: Gen:  Denies  fever, sweats,  chills HEENT: Denies blurred vision, double vision, ear pain, eye pain, hearing loss, nose bleeds, sore throat Cvc:  No dizziness, chest pain or heaviness Resp:  Less coughing, less sob Gi: Denies swallowing difficulty, stomach pain, nausea or vomiting, diarrhea, constipation, bowel incontinence Gu:  Denies bladder incontinence, burning urine Ext:   No Joint pain, stiffness or swelling Skin: No skin rash, easy bruising or bleeding or hives Endoc:  No polyuria, polydipsia , polyphagia or weight change Psych: No depression, insomnia or hallucinations  Other:  All other systems negative  Physical Examination:   VS: BP 134/69 (BP Location: Left Arm)   Pulse 85   Temp 97.6 F (36.4 C) (Axillary)   Resp 18   Ht 5\' 6"  (1.676 m)   Wt 65.2 kg   SpO2 99%   BMI 23.20 kg/m   General Appearance: No distress  Neuro: without focal findings, more  alert, speech normal, alert and oriented, cranial nerves 2-12 intact, reflexes normal and symmetric, sensation grossly normal  HEENT: PERRLA, EOM intact, no ptosis, no other lesions noticed, : Pulmonary:.No wheezing, No rales  Moving more air  Cardiovascular:  Normal S1,S2.  No m/r/g.  Abdominal aorta pulsation normal.    Abdomen:Benign, Soft, non-tender, No masses, hepatosplenomegaly, No lymphadenopathy Endoc: No evident thyromegaly, no signs of acromegaly or Cushing features Skin:   warm, no rashes, no ecchymosis  Extremities: normal, no cyanosis, clubbing, no edema, warm with normal capillary refill. Other findings:   Labs results:   Recent Labs    12/20/19 2111 12/21/19 0625  HGB 16.0* 13.6  HCT 46.4* 39.9  MCV 88.0 88.3  WBC 4.4 3.0*  BUN  --  24*  CREATININE  --  0.81  GLUCOSE  --  127*  CALCIUM  --  8.8*   Fluid Type-FCT  CYTO PLEU  PLEURAL VC, CM   Color, Fluid YELLOW YELLOWAbnormal  YELLOW   Appearance, Fluid CLEAR CLEAR  CLEARAbnormal   Total Nucleated Cell Count, Fluid cu mm 195  496   Neutrophil Count, Fluid % 3  11   Lymphs, Fluid % 62  80   Monocyte-Macrophage-Serous Fluid % 35  9   Eos, Fluid % 0  0    ,Results for Autumn, Bradley (MRN [id number]) as of 12/23/2019 12:16  Ref. Range 07/08/2018 13:48 12/22/2019 16:00  Albumin, Fluid Latest Units: g/dL 1.5   Fluid Type-FALB Unknown PLEURAL   Amylase, Fluid Latest Units: U/L  82  Fluid Type-FAMY Unknown  CYTO PLEU  Glucose, Fluid Latest Units: mg/dL 133 106  Fluid Type-FGLU Unknown PLEURAL CYTO PLEU  Fluid Type-FLDH Unknown PLEURAL   LD, Fluid Latest Ref Range: 3 - 23 U/L 62 (H)   Total protein, fluid Latest Units: g/dL  <3.0  Fluid Type-FCT Unknown PLEURAL CYTO PLEU  Fluid Type-FTP Unknown  CYTO PLEU    Results for [name], Bradley (MRN [id number]) as of 12/23/2019 12:16  Ref. Range 12/19/2019 15:01  SEE BELOW Unknown Comment  Anti Nuclear Antibody (ANA) Latest Ref Range: Negative  Positive (A)    Angiotensin-Converting Enzyme Latest Ref Range: 14 - 82 U/L 110 (H)  ds DNA Ab Latest Ref Range: 0 - 9 IU/mL 40 (H)  RA Latex Turbid. Latest Ref Range: 0.0 - 13.9 IU/mL <10.0  Cytoplasmic (C-ANCA) Latest Ref Range: Neg:<1:20 titer <1:20  P-ANCA Latest Ref Range: Neg:<1:20 titer <1:20  Atypical P-ANCA titer Latest Ref Range: Neg:<1:20 titer <1:20  ENA SM Ab Ser-aCnc Latest Ref Range: 0.0 - 0.9 AI <0.2  Rad results:   DG Chest Port 1 View  Result Date: 12/22/2019 CLINICAL DATA:  S/p thoracentesis on the left EXAM: PORTABLE CHEST - 1 VIEW COMPARISON:  12/20/2019 FINDINGS: Significant resolution of left effusion. No pneumothorax. Improved aeration at the left lung base. Persistent airspace opacity medially in the left upper lung. Persistent small right pleural effusion with patchy airspace opacities medially at the right lung base. Persistent cardiomegaly.  Aortic Atherosclerosis (ICD10-170.0). Visualized bones unremarkable. IMPRESSION: 1. No pneumothorax post left thoracentesis. 2. Improved aeration at the left lung base. Electronically Signed   By: Lucrezia Europe M.D.   On: 12/22/2019 16:37   US THORACENTESIS ASP PLEURAL SPACE W/IMG GUIDE  Result Date: 12/22/2019 INDICATION: Hypoxia, pleural effusions left greater than right EXAM: ULTRASOUND GUIDED LEFT THORACENTESIS MEDICATIONS: Lidocaine 1% subcutaneous COMPLICATIONS: None immediate. PROCEDURE: An ultrasound guided thoracentesis was thoroughly discussed with the patient and questions answered. The benefits, risks, alternatives and complications were also discussed. The patient understands and wishes to proceed with the procedure. Written consent was obtained. Ultrasound was performed to localize and mark an adequate pocket of fluid in the left chest. The area was then prepped and draped in the normal sterile fashion. 1% Lidocaine was used for local anesthesia. Under ultrasound guidance a 6 Fr Safe-T-Centesis catheter was introduced.  Thoracentesis was performed. The catheter was removed and a dressing applied. FINDINGS: A total of approximately 900 mL of amber fluid was removed. IMPRESSION: Successful ultrasound guided left thoracentesis yielding 900 mL of pleural fluid. No pneumothorax on follow-up radiograph. Electronically Signed   By: Lucrezia Europe M.D.   On: 12/22/2019 16:34    12/22/19 Nodular infiltrates look less dense.    Assessment and Plan: This is an elderly,  with multiple problems. Pulmonary wise she has; 1. Primarily basal nodular infiltratesand liver MRI findings that points most likely to metastatic cancer but the  D/DX also include atypical pulmonary edema, granulomatous diseases such as sarcoid (ace 110), CTD screen (ana, ds ab) is positive in a female. ? Significance. ? Lupus, vasculitis etc. Will have rheum to see.   -Family are moving towards comfort care -Awaiting cytology results. -If liver biopsy is not planned, may be able to d/c home in 24-48 hrs, with home care or palliative ( social worker following)    2. Left moderate size effusion, small right, ef 25 %. S/p left thoracentesis for both diagnostic and therapeutic reasons. Torated it well. Breathing easier, less coughing.  Transudate. CHF is most likelythecause. Typically a malignant effusion is an exudate. c/s, cytology, pending  3. Pericardial effusion, no tamponade, vitals are good.Cardiology following -following   I have personally obtained a history, examined the patient, evaluated laboratory and imaging results, formulated the assessment and plan and placed orders.  The Patient requires high complexity decision making for assessment and support, frequent evaluation and titration of therapies, application of advanced monitoring technologies and extensive interpretation of multiple databases.   Miraj Truss,M.D. Pulmonary & Critical care Medicine [facility]

## 2019-12-23 NOTE — Progress Notes (Addendum)
ORDERS TO TRANSFER PT TO ROOM 129.  [id number] CALLED REPORT TO MELISSA RN TO ROOM 129 0415 PT TRANSFERRED TO ROOM 129 IN CARE OF MELISSA RN

## 2019-12-24 LAB — CYTOLOGY - NON PAP

## 2019-12-24 MED ORDER — POLYETHYLENE GLYCOL 3350 17 G PO PACK
17.0000 g | PACK | Freq: Every day | ORAL | Status: DC
  Administered 2019-12-24 – 2019-12-25 (×2): 17 g via ORAL
  Filled 2019-12-24 (×2): qty 1

## 2019-12-24 NOTE — Progress Notes (Addendum)
Daily Progress Note   Patient Name: Autumn Bradley       Date: 12/24/2019 DOB: [birth year]  Age: 86 y.o. MRN#: [id number] Attending Physician: Lorella Nimrod, MD Primary Care Physician: Adin Hector, MD Admit Date: 12/15/2019  Reason for Consultation/Follow-up: Establishing goals of care  Subjective: Patient is resting in bed. She is conversive. Daughter Juliann Pulse is at bedside. Patient states she has eaten breakfast and is full. Her meal tray has only bites and sips missing. Per daughter she did not eat well yesterday. Discussed course of continued aggressive care vs comfort based care. She would like to speak with her family as she wants their feelings on how to move forward. She has stated she would not want a feeding tube and would not want a biopsy. Will wait for her to speak with family.  Based on her oral intake, she is appropriate for hospice facility if she chooses.   ADDENDUM: DIL who is HPOA at bedside, and son who is 2nd HPOA was on facetime. Lunch is at bedside and only a bite and sips missing. We discussed continuing a life prolonging plan and a comfort approach. She confirms she would not want a feeding tube, biopsy, or SNF placement. Family discusses their inability to take her home. Patient states she wants to focus on comfort based care. She and family would like hospice facility placement.   I completed a MOST form today with DIL HPOA at bedside, and the signed original was placed in the chart. A photocopy was placed in the chart to be scanned into EMR. The patient outlined their wishes for the following treatment decisions:  Cardiopulmonary Resuscitation: Do Not Attempt Resuscitation (DNR/No CPR)  Medical Interventions: Comfort Measures: Keep clean, warm, and dry. Use medication  by any route, positioning, wound care, and other measures to relieve pain and suffering. Use oxygen, suction and manual treatment of airway obstruction as needed for comfort. Do not transfer to the hospital unless comfort needs cannot be met in current location.  Antibiotics: No antibiotics (use other measures to relieve symptoms)  IV Fluids: No IV fluids (provide other measures to ensure comfort)  Feeding Tube: No feeding tube     Length of Stay: 9  Current Medications: Scheduled Meds:  . feeding supplement (ENSURE ENLIVE)  237 mL Oral TID BM  .  latanoprost  1 drop Both Eyes QHS  . LORazepam  0.5 mg Intravenous Once  . polyethylene glycol  17 g Oral Daily  . timolol  1 drop Both Eyes Daily    Continuous Infusions: . morphine      PRN Meds: acetaminophen **OR** acetaminophen, antiseptic oral rinse, glycopyrrolate **OR** glycopyrrolate **OR** glycopyrrolate, haloperidol **OR** haloperidol **OR** haloperidol lactate, levalbuterol, morphine injection, morphine, ondansetron **OR** ondansetron (ZOFRAN) IV, polyvinyl alcohol  Physical Exam Pulmonary:     Effort: Pulmonary effort is normal.  Neurological:     Mental Status: She is alert.     Comments: Oriented and conversive.              Vital Signs: BP (!) 148/124 (BP Location: Left Arm)   Pulse 95   Temp 97.8 F (36.6 C) (Axillary)   Resp 18   Ht _0  (1.676 m)   Wt 65.2 kg   SpO2 96%   BMI 23.20 kg/m  SpO2: SpO2: 96 % O2 Device: O2 Device: Nasal Cannula O2 Flow Rate: O2 Flow Rate (L/min): 2 L/min  Intake/output summary:   Intake/Output Summary (Last 24 hours) at 12/24/2019 1004 Last data filed at 12/24/2019 0542 Gross per 24 hour  Intake 120 ml  Output 350 ml  Net -230 ml   LBM: Last BM Date:  (Last BM unknown) Baseline Weight: Weight: 56.7 kg Most recent weight: Weight: 65.2 kg       Palliative Assessment/Data: 30%      Patient Active Problem List   Diagnosis Date Noted  . Poor fluid intake   .  Protein-calorie malnutrition, severe 12/16/2019  . Pulmonary nodules   . Failure to thrive (0-17)   . DNR (do not resuscitate)   . AMS (altered mental status) 12/15/2019  . Recurrent left pleural effusion   . Goals of care, counseling/discussion   . Palliative care by specialist   . CHF (congestive heart failure) ([place]) 07/07/2018    Palliative Care Assessment & Plan    Recommendations/Plan:  Poor oral intake. Hospice facility appropriate if she chooses. She wants to think about things and speak with her family herself when they arrive in a few hours.   ADDENDUM: Hospice facility recommended.     Code Status:    Code Status Orders  (From admission, onward)         Start     Ordered   12/21/19 1351  Do not attempt resuscitation (DNR)  Continuous       Question Answer Comment  In the event of cardiac or respiratory ARREST Do not call a "code blue"   In the event of cardiac or respiratory ARREST Do not perform Intubation, CPR, defibrillation or ACLS   In the event of cardiac or respiratory ARREST Use medication by any route, position, wound care, and other measures to relive pain and suffering. May use oxygen, suction and manual treatment of airway obstruction as needed for comfort.      12/21/19 1353        Code Status History    Date Active Date Inactive Code Status Order ID Comments User Context   12/15/2019 1645 12/21/2019 1353 DNR [id number]  Enzo Bi, MD Inpatient   07/07/2018 1014 07/11/2018 1736 DNR [id number]  Hillary Bow, MD ED   07/07/2018 0907 07/07/2018 1014 Full Code [id number]  Hillary Bow, MD ED   Advance Care Planning Activity       Prognosis:   Unable to determine   Thank you for allowing the  Palliative Medicine Team to assist in the care of this patient.   Total Time 35 min Prolonged Time Billed no      Greater than 50%  of this time was spent counseling and coordinating care related to the above assessment and plan.  Asencion Gowda,  NP  Please contact Palliative Medicine Team phone at [phone] for questions and concerns.

## 2019-12-24 NOTE — Progress Notes (Signed)
Date: 12/24/2019,   MRN# [id number] Autumn Bradley 1931-09-13 Code Status:     Code Status Orders  (From admission, onward)         Start     Ordered   12/21/19 1351  Do not attempt resuscitation (DNR)  Continuous       Question Answer Comment  In the event of cardiac or respiratory ARREST Do not call a code blue   In the event of cardiac or respiratory ARREST Do not perform Intubation, CPR, defibrillation or ACLS   In the event of cardiac or respiratory ARREST Use medication by any route, position, wound care, and other measures to relive pain and suffering. May use oxygen, suction and manual treatment of airway obstruction as needed for comfort.      12/21/19 1353        Code Status History    Date Active Date Inactive Code Status Order ID Comments User Context   12/15/2019 1645 12/21/2019 1353 DNR [id number]  Enzo Bi, MD Inpatient   07/07/2018 1014 07/11/2018 1736 DNR [id number]  Hillary Bow, MD ED   07/07/2018 0907 07/07/2018 1014 Full Code [id number]  Hillary Bow, MD ED   Advance Care Planning Activity        HPI: RESTING, FAMILY AT BED SIDE, NO NEW COMPLIANTS. Lung cancer with liver mets. Poor heart with bilateral effusions. D/p left thoracentesis, cytology negative. Family aware of the poor prognosis. Appreciate rheum consult.   PMHX:   Past Medical History:  Diagnosis Date   Atrial fibrillation ([place])    Cancer ([place])    SKIN CANCER   Degenerative joint disease    Glaucoma    Hypercholesteremia    Hypertension    CONTROLLED ON MEDS   Shortness of breath dyspnea    [place]   Wears dentures    partial upper and lower   Surgical Hx:  Past Surgical History:  Procedure Laterality Date   ABDOMINAL HYSTERECTOMY     CARDIAC CATHETERIZATION  07/2012   [facility] - Neg for blockages   CARDIAC ELECTROPHYSIOLOGY STUDY AND ABLATION     CATARACT EXTRACTION W/PHACO Left 03/17/2015   Procedure: CATARACT EXTRACTION PHACO AND INTRAOCULAR LENS PLACEMENT ([place]);   Surgeon: Leandrew Koyanagi, MD;  Location: [place];  Service: Ophthalmology;  Laterality: Left;   CATARACT EXTRACTION W/PHACO Right 04/14/2015   Procedure: CATARACT EXTRACTION PHACO AND INTRAOCULAR LENS PLACEMENT (IOC);  Surgeon: Leandrew Koyanagi, MD;  Location: [place];  Service: Ophthalmology;  Laterality: Right;   CHOLECYSTECTOMY     COLONOSCOPY     TONSILLECTOMY     Family Hx:  Family History  Problem Relation Age of Onset   Diabetes Mellitus II Mother    Heart failure Father    COPD Sister    Heart attack Sister    Heart failure Sister    Colon cancer Brother    Kidney cancer Brother    Lung cancer Brother    Social Hx:   Social History   Tobacco Use   Smoking status: Never Smoker   Smokeless tobacco: Never Used  Substance Use Topics   Alcohol use: No   Drug use: Not on file   Medication:    Home Medication:    Current Medication: @CURMEDTAB @   Allergies:  Patient has no known allergies.  Pulmonary & Critical care Medicine [facility]   Date: 12/24/2019,   MRN# [id number] Autumn Bradley 09-04-1931 Code Status:         Code Status  History    Review of Systems: Gen:  Denies  fever, sweats, chills HEENT: Denies blurred vision, double vision, ear pain, eye pain, hearing loss, nose bleeds, sore throat Cvc:  No dizziness, chest pain or heaviness Resp:  BREATHING EASY  Gi: Denies swallowing difficulty, stomach pain, nausea or vomiting, diarrhea, constipation, bowel incontinence Gu:  Denies bladder incontinence, burning urine Ext:   No Joint pain, stiffness or swelling Skin: No skin rash, easy bruising or bleeding or hives Endoc:  No polyuria, polydipsia , polyphagia or weight change Psych: No depression, insomnia or hallucinations  Other:  All other systems negative  Physical Examination:   VS: BP 96/78 (BP Location: Left Arm)    Pulse 100    Temp 97.9 F (36.6 C) (Axillary)    Resp 16    Ht 5\' 6"  (1.676 m)     Wt 65.2 kg    SpO2 93%    BMI 23.20 kg/m   General Appearance: No distress  Neuro: without focal findings, mental status, speech normal, alert and oriented, cranial nerves 2-12 intact, reflexes normal and symmetric, sensation grossly normal  HEENT: PERRLA, EOM intact, no ptosis, no other lesions noticed, Mallampati: Pulmonary:.No wheezing, No rales     Cardiovascular:  Normal S1,S2.  No m/r/g.   Abdomen:Benign, Soft, non-tender, No masses, hepatosplenomegaly, No lymphadenopathy Endoc: No evident thyromegaly, no signs of acromegaly or Cushing features Skin:   warm, no rashes, no ecchymosis  Extremities: normal, no cyanosis, clubbing, no edema, warm with normal capillary refill. Other findings:     Assessment and Plan:  This is an elderly,  with multiple problems. Pulmonary wise she has; 1. Primarily basal nodular infiltratesand liver MRI findings that points most likely to metastatic cancer. Appreciate rheum input.  Comfort measures being followed. Family all on board.    -Family are moving towards comfort care -following from a distance  2. Left moderate size effusion, small right, ef 25 %. S/p left thoracentesis for both diagnostic and therapeutic reasons. Torated it well. Breathing easier, less coughing.  Transudate. CHF is most likelythecause. Typically a malignant effusion is an exudate. Cytology negative following cardiology recs   Thanks  I have personally obtained a history, examined the patient, evaluated laboratory and imaging results, formulated the assessment and plan and placed orders.  The Patient requires high complexity decision making for assessment and support, frequent evaluation and titration of therapies, application of advanced monitoring technologies and extensive interpretation of multiple databases.   Mylo Driskill,M.D. Pulmonary & Critical care Medicine [facility]

## 2019-12-24 NOTE — Progress Notes (Signed)
PROGRESS NOTE    RAEGEN TARPLEY  SNK:[id number] DOB: 22-Jun-1932 DOA: 12/15/2019 PCP: Adin Hector, MD   Brief Narrative:  Autumn Bradley is a 86 y.o. Caucasian female with medical history significant of systolic CHF, Afib on Eliquis, pleural effusions and pericardial effusion who presented with a myriad of complaints. Basically a failure to thrive and declining health.  There was some concern of some confusion and hallucinations over the past 1 month.  CT chest with multiple nodular opacities bilaterally in lung and liver, concern for metastatic disease with unknown primary.   Patient developed acute respiratory failure overnight requiring BiPAP during the night prior to 12/21/2019.  During the day on 20th she decompensated further, becoming hypoxic on BiPAP, bradycardic and hypotensive not really responding to IV fluid.  There was some concern of cardiac tamponade and cardiology was also consulted.  Echo without any sign of tamponade and with persistent of large pericardial effusion.  Family decided to transition her to comfort care.  Comfort care was initiated and she was given some morphine before removing BiPAP.  Patient remained unresponsive for some time and then regained consciousness.  By night she was up and talking with the family.  She had her thoracentesis done on 12/22/2019 which appears transudate.  Cytology pending.    Subjective: Has no new complaints today.  Denies any shortness of breath.  Patient would like to go to hospice home but will discuss with family before making final decision.  Assessment & Plan:   Active Problems:   AMS (altered mental status)   Pulmonary nodules   Failure to thrive (0-17)   DNR (do not resuscitate)   Protein-calorie malnutrition, severe   Poor fluid intake  Acute respiratory failure.  Resolved. On 12/21/19. she was transitioned to comfort measures after discussing with family due to acute deterioration.  She gave another surprise after taking  her off from BiPAP.  Couple of hours later she becomes more responsive. She appears to be at her baseline.  Had her thoracentesis with removal of 900 cc pleural fluid.  Preliminary labs are consistent with transudate.  Altered mental status/failure to thrive/falls.  Appears to be at her baseline now. Per family concern patient is becoming confused and hallucinations at times.  She was alert and oriented x3 with me.  Able to answer all the questions appropriately.  Patient has a progressive decline over the past year.  She recently lost her son due to lymphoma. -Delirium precautions. -PT/OT evaluation-pending SNF placement. -Patient wants to go to hospice home and she qualifies although there is no bed availability at this time.  Nodular opacities in lungs and liver Weight loss --concerning for metastatic disease.  Patient and her daughter-in-law was not aware of any prior diagnosis of any type of cancer.  She does not want very aggressive measures for diagnosis or treatment.  Daughter-in-law also wants to involve oncology for a second opinion.  She was also agreeable to see a palliative care for symptom management, as she wants patient to remain comfortable. -CT abdomen and pelvis-with numerous hepatic masses, advising dedicated liver MRI.  Small amount of free fluid within abdomen and pelvis. -Consult oncology-Dr. Mike Gip will review her imaging with IR and will come up some plan for definitive diagnosis.  Patient's family wants diagnosis but might not consider chemotherapy if it is a metastatic disease.  -MRI liver-with multiple liver lesions highly suspicious for metastatic disease. -Consult palliative care. -Dr. Raul Del, her pulmonologist was consulted by oncology. She  is not a good candidate for bronchoscopy.  Thoracentesis was performed yesterday which is more consistent with transudate.  Cytology results are pending.  Pulmonary sent some rheumatologic labs which came back positive for ANA and  double-stranded DNA, angiotensin-converting enzymes elevated at 110.  There is some concern of granulomatous disease.  They are recommending rheumatologic evaluation. -Neurology was consulted by pulmonary, they do not think that they can add anything else, she had a rheumatologic work-up done earlier and it was negative for any definitive disease.  They also think that it is a metastatic disease. -PET Scan can be considered as an outpatient. -Patient would like to go to hospice home once the bed is available.  AKI.  Resolved  -Continue to monitor. -Avoid nephrotoxins  Hyponatremia.  It was thought to be due to poor p.o. intake.  Sodium remains between 1 26-1 28 despite giving some fluid.  Hyponatremia labs with mildly elevated TSH and other concerning for SIADH. -Continue IV fluid-at a lower rate as patient is on BiPAP now and we will try to avoid volume overload. -Continue to monitor sodium. -Check hyponatremia labs.  Chronic cough.  No leukocytosis, patient remained afebrile.  Procalcitonin negative.  Patient recently finished Z-Pak with no improvement.  There is concern for malignancy.  She was given a dose of ceftriaxone and azithromycin in the ED which was not continued. -Supportive care with cough suppressant.  Bilateral pleural effusions, chronic --L>R.  Left side probably loculated.  Had left thoracentesis back on 07/08/18.   --Pulmonary is recommending repeat thoracentesis if there is a free-floating fluid on decubitus chest x-ray. -thoracentesis done 12/22/19, labs consistent with transudate. Removal of 900 cc.  Large pericardial effusion, chronic, stable -Known condition in Jan 2020, appeared to be stable since then. -There was some concern of cardiac tamponade secondary to acutely decompensated respiratory function hypertension.  Repeat echogram with persistent large pericardial effusion but there is no current sign of tamponade.  Afib on Eliquis -currently rate controlled.     -Holding home dose of Eliquis in case patient decided to go for biopsy.  Hx of chronic systolic CHF with LVEF 38-18% -BNP 781, but does not appear fluid overloaded.  Only on Lasix 20 mg PRN at home. -Hold diuretic since BP low.  Glaucoma -continue home eye drops  Objective: Vitals:   12/24/19 0317 12/24/19 0736 12/24/19 0737 12/24/19 1205  BP: 96/64 (!) 148/124  96/78  Pulse: 99 (!) 167 95 100  Resp: _0 Temp: (!) 97.4 F (36.3 C) 97.8 F (36.6 C)  97.9 F (36.6 C)  TempSrc: Oral Axillary  Axillary  SpO2: 96% 96% 96% 93%  Weight:      Height:        Intake/Output Summary (Last 24 hours) at 12/24/2019 1705 Last data filed at 12/24/2019 0542 Gross per 24 hour  Intake --  Output 50 ml  Net -50 ml   Filed Weights   12/15/19 1137 12/20/19 2314 12/21/19 0435  Weight: 56.7 kg 64.3 kg 65.2 kg    Examination:  General exam: Frail elderly lady with multiple ecchymosis, appears calm and comfortable with BiPAP on. Respiratory system: Harsh transmitted sounds bilaterally,. Respiratory effort normal. Cardiovascular system: S1 & S2 heard, RRR.  Gastrointestinal system: Soft, nontender, nondistended, bowel sounds positive. Central nervous system: Alert and oriented. No focal neurological deficits. Extremities: No edema, no cyanosis, pulses intact and symmetrical. Psychiatry: Judgement and insight appear normal.   DVT prophylaxis: Lovenox Code Status: DNR Family Communication: Son  was updated at bedside. Disposition Plan:  Status is: Inpatient  Remains inpatient appropriate because:Ongoing diagnostic testing needed not appropriate for outpatient work up   Dispo: The patient is from: Home              Anticipated d/c is to: His home              Anticipated d/c date is: 1-2 days              Patient currently is not medically stable.  Patient eventually decided to go to hospice home.  She did qualify but there is no bed availability. Discharge to hospice home once  bed is available.  Consultants:   Oncology  Palliative care  Pulmonology  Procedures:  Antimicrobials:   Data Reviewed: I have personally reviewed following labs and imaging studies  CBC: Recent Labs  Lab 12/18/19 0507 12/20/19 2111 12/21/19 0625  WBC 3.7* 4.4 3.0*  HGB 15.2* 16.0* 13.6  HCT 42.0 46.4* 39.9  MCV 83.8 88.0 88.3  PLT 103* 99* 87*   Basic Metabolic Panel: Recent Labs  Lab 12/18/19 0507 12/19/19 0338 12/21/19 0625  NA 126* 128* 126*  K 4.8 4.2 4.3  CL 91* 93* 91*  CO2 _0 GLUCOSE 117* 110* 127*  BUN 33* 25* 24*  CREATININE 1.24* 0.84 0.81  CALCIUM 8.6* 8.4* 8.8*  MG 2.2  --   --   PHOS  --  2.5  --    GFR: Estimated Creatinine Clearance: 46.7 mL/min (by C-G formula based on SCr of 0.81 mg/dL). Liver Function Tests: Recent Labs  Lab 12/19/19 0338  ALBUMIN 2.7*   No results for input(s): LIPASE, AMYLASE in the last 168 hours. No results for input(s): AMMONIA in the last 168 hours. Coagulation Profile: No results for input(s): INR, PROTIME in the last 168 hours. Cardiac Enzymes: No results for input(s): CKTOTAL, CKMB, CKMBINDEX, TROPONINI in the last 168 hours. BNP (last 3 results) No results for input(s): PROBNP in the last 8760 hours. HbA1C: No results for input(s): HGBA1C in the last 72 hours. CBG: No results for input(s): GLUCAP in the last 168 hours. Lipid Profile: No results for input(s): CHOL, HDL, LDLCALC, TRIG, CHOLHDL, LDLDIRECT in the last 72 hours. Thyroid Function Tests: No results for input(s): TSH, T4TOTAL, FREET4, T3FREE, THYROIDAB in the last 72 hours. Anemia Panel: No results for input(s): VITAMINB12, FOLATE, FERRITIN, TIBC, IRON, RETICCTPCT in the last 72 hours. Sepsis Labs: Recent Labs  Lab 12/20/19 2111 12/20/19 2348  PROCALCITON <0.10  --   LATICACIDVEN 2.3* 2.0*    Recent Results (from the past 240 hour(s))  Urine culture     Status: None   Collection Time: 12/15/19 11:53 AM   Specimen: Urine,  Random  Result Value Ref Range Status   Specimen Description   Final    URINE, RANDOM Performed at [facility], [address] [place], [place] [address]    Special Requests   Final    NONE Performed at [facility], [address], [place], [place] [address]    Culture   Final    NO GROWTH Performed at [facility], [place] [address], [place], [place] [address]    Report Status 12/16/2019 FINAL  Final  SARS Coronavirus 2 by RT PCR (hospital order, performed in [facility] hospital lab) Nasopharyngeal Nasopharyngeal Swab     Status: None   Collection Time: 12/15/19  2:38 PM   Specimen: Nasopharyngeal Swab  Result  Value Ref Range Status   SARS Coronavirus 2 NEGATIVE NEGATIVE Final    Comment: (NOTE) SARS-CoV-2 target nucleic acids are NOT DETECTED.  The SARS-CoV-2 RNA is generally detectable in upper and lower respiratory specimens during the acute phase of infection. The lowest concentration of SARS-CoV-2 viral copies this assay can detect is 250 copies / mL. A negative result does not preclude SARS-CoV-2 infection and should not be used as the sole basis for treatment or other patient management decisions.  A negative result may occur with improper specimen collection / handling, submission of specimen other than nasopharyngeal swab, presence of viral mutation(s) within the areas targeted by this assay, and inadequate number of viral copies (<250 copies / mL). A negative result must be combined with clinical observations, patient history, and epidemiological information.  Fact Sheet for Patients:   StrictlyIdeas.no  Fact Sheet for Healthcare Providers: BankingDealers.co.za  This test is not yet approved or  cleared by the [place] FDA and has been authorized for detection and/or diagnosis of SARS-CoV-2 by FDA under an Emergency Use Authorization (EUA).  This EUA will remain in effect (meaning this  test can be used) for the duration of the COVID-19 declaration under Section 564(b)(1) of the Act, 21 U.S.C. section 360bbb-3(b)(1), unless the authorization is terminated or revoked sooner.  Performed at [facility], [address] [place], [place] [address]   Blood culture (routine x 2)     Status: None   Collection Time: 12/15/19  2:38 PM   Specimen: BLOOD  Result Value Ref Range Status   Specimen Description BLOOD LEFT ANTECUBITAL  Final   Special Requests   Final    BOTTLES DRAWN AEROBIC AND ANAEROBIC Blood Culture adequate volume   Culture   Final    NO GROWTH 5 DAYS Performed at [facility], [address], [place], [place] [address]    Report Status 12/20/2019 FINAL  Final  Blood culture (routine x 2)     Status: None   Collection Time: 12/15/19  2:38 PM   Specimen: BLOOD  Result Value Ref Range Status   Specimen Description BLOOD BLOOD RIGHT ARM  Final   Special Requests   Final    BOTTLES DRAWN AEROBIC AND ANAEROBIC Blood Culture adequate volume   Culture   Final    NO GROWTH 5 DAYS Performed at [facility], [address] [place], [place] [address]    Report Status 12/20/2019 FINAL  Final  Body fluid culture     Status: None (Preliminary result)   Collection Time: 12/22/19  4:00 PM   Specimen: PATH Cytology Pleural fluid  Result Value Ref Range Status   Specimen Description FLUID PLEURAL  Final   Special Requests NONE  Final   Gram Stain   Final    RARE WBC PRESENT, PREDOMINANTLY MONONUCLEAR NO ORGANISMS SEEN    Culture   Final    NO GROWTH 2 DAYS Performed at [facility], [place] [address], [place], [place] [address]    Report Status PENDING  Incomplete     Radiology Studies: No results found.  Scheduled Meds: . feeding supplement (ENSURE ENLIVE)  237 mL Oral TID BM  . latanoprost  1 drop Both Eyes QHS  . LORazepam  0.5 mg Intravenous Once  . polyethylene glycol  17 g Oral Daily  . timolol  1 drop Both Eyes  Daily   Continuous Infusions:    LOS: 9 days   Time spent: 35 minutes.  Lorella Nimrod, MD [facility]  If 7PM-7AM, please contact night-coverage [email or web address]  12/24/2019, 5:05 PM   This record has been created using Systems analyst. Errors have been sought and corrected,but may not always be located. Such creation errors do not reflect on the standard of care.

## 2019-12-24 NOTE — TOC Progression Note (Signed)
Transition of Care [facility]) - Progression Note    Patient Details  Name: Autumn Bradley MRN: [id number] Date of Birth: 11/28/1931  [facility]) CM/SW Contact  Jeymi Hepp, Gardiner Rhyme, LCSW Phone Number: 12/24/2019, 2:22 PM  Clinical Narrative:  Met with [name]-[facility]POA and pt and the decision has been made for the hospice home. Have contacted Karen[facility] to touch base with family. [name] and pt agreeable to this.     Expected Discharge Plan: Home/Self Care Barriers to Discharge: Continued Medical Work up  Expected Discharge Plan and Services Expected Discharge Plan: Home/Self Care In-house Referral: Clinical Social Work     Living arrangements for the past 2 months: Single Family Home                                       Social Determinants of Health (SDOH) Interventions    Readmission Risk Interventions No flowsheet data found.

## 2019-12-24 NOTE — Progress Notes (Signed)
[facility]) Hospital Liaison RN note  Received request from Asencion Gowda, NP with PMT for family interest in [place]. Chart reviewed and spoke with family to acknowledge referral.  Unfortunately Hospice Home is not able to offer a room today. Family and Transitions of Care Manager aware hospital liaison will follow up tomorrow or sooner if room becomes available.   Please do not hesitate to call with questions.   Thank you.   Margaretmary Eddy, BSN, RN [facility] Liaison [phone]

## 2019-12-25 DIAGNOSIS — J90 Pleural effusion, not elsewhere classified: Secondary | ICD-10-CM

## 2019-12-25 DIAGNOSIS — R0602 Shortness of breath: Secondary | ICD-10-CM

## 2019-12-25 DIAGNOSIS — R0902 Hypoxemia: Secondary | ICD-10-CM

## 2019-12-25 DIAGNOSIS — J189 Pneumonia, unspecified organism: Secondary | ICD-10-CM

## 2019-12-25 DIAGNOSIS — G9340 Encephalopathy, unspecified: Secondary | ICD-10-CM

## 2019-12-25 MED ORDER — POLYVINYL ALCOHOL 1.4 % OP SOLN: 1.0000 [drp] | Freq: Four times a day (QID) | OPHTHALMIC | 0 refills | Status: AC | PRN

## 2019-12-25 MED ORDER — ENSURE ENLIVE PO LIQD: 237.0000 mL | Freq: Three times a day (TID) | ORAL | 12 refills | Status: AC

## 2019-12-25 MED ORDER — HALOPERIDOL 0.5 MG PO TABS: 0.5000 mg | ORAL_TABLET | ORAL | Status: AC | PRN

## 2019-12-25 MED ORDER — HALOPERIDOL LACTATE 5 MG/ML IJ SOLN: 0.5000 mg | INTRAMUSCULAR | Status: AC | PRN

## 2019-12-25 MED ORDER — MORPHINE SULFATE (PF) 2 MG/ML IV SOLN: 1.0000 mg | INTRAVENOUS | 0 refills | Status: AC | PRN

## 2019-12-25 MED ORDER — LORAZEPAM 2 MG/ML IJ SOLN: 0.5000 mg | Freq: Once | INTRAMUSCULAR | 0 refills | Status: AC

## 2019-12-25 MED ORDER — BIOTENE DRY MOUTH MT LIQD: 15.0000 mL | OROMUCOSAL | Status: AC | PRN

## 2019-12-25 MED ORDER — GLYCOPYRROLATE 0.2 MG/ML IJ SOLN: 0.2000 mg | INTRAMUSCULAR | Status: AC | PRN

## 2019-12-25 MED ORDER — HALOPERIDOL LACTATE 2 MG/ML PO CONC: 0.5000 mg | ORAL | 0 refills | Status: AC | PRN

## 2019-12-25 NOTE — Discharge Summary (Signed)
Physician Discharge Summary  Autumn Bradley VPX:[id number] DOB: 1932-04-03 DOA: 12/15/2019  PCP: Adin Hector, MD  Admit date: 12/15/2019 Discharge date: 12/25/2019  Admitted From: Home Disposition: Hospice home. Discharge Condition: Guarded CODE STATUS: DNR Diet recommendation: Heart Healthy / Carb Modified / Regular / Dysphagia   Brief/Interim Summary: Autumn Bradley a 86 y.o.Caucasianfemalewith medical history significant ofsystolic CHF, Afib on Eliquis, pleural effusions andpericardial effusionwho presented with a myriad of complaints. Basically a failure to thrive and declining health.  There was some concern of some confusion and hallucinations over the past 1 month.  CT chest with multiple nodular opacities bilaterally in lung and liver, concern for metastatic disease with unknown primary.  Oncology, cardiology and pulmonology was consulted.  Patient does not want to undergo very aggressive measures.  She is not a good candidate for biopsy either with IR or with pulmonary through bronchoscopy.  She had a thoracentesis done on 12/22/2019 which appears transudate.  Cytology was negative for malignancy.  Most likely secondary to her heart failure, patient has a very low EF of 20 to 25%.  She had chronic large pericardial effusion.  Which seems stable.  During her stay she developed acute respiratory failure overnight requiring BiPAP during the night prior to 12/21/2019.  During the day on 20th she decompensated further, becoming hypoxic on BiPAP, bradycardic and hypotensive not really responding to IV fluid.  There was some concern of cardiac tamponade and cardiology was also consulted.  Echo without any sign of tamponade and with persistent of large pericardial effusion.  Family decided to transition her to comfort care.  Comfort care was initiated and she was given some morphine before removing BiPAP.  Patient remained unresponsive for some time and then regained consciousness.  By  night she was up and talking with the family.  And remained at her baseline since then.  Had a very poor p.o. intake.  After discussing with patient and family she would like to go to hospice home to stay comfortable.  There was some concern of underlying rheumatologic disorder like sarcoidosis.  Due to some equivocal labs which includes mildly elevated ACE inhibitor and a positive ANA and dsDNA.  Rheumatology was consulted and she did had a rheumatologic work-up in the past due to those positive test.  That came back as negative for any connective tissue disorder.  Sarcoidosis cannot be completely ruled out but less likely according to their note.  They also think that she has some metastatic disease.  Patient did had chronic hyponatremia most likely secondary to poor p.o. intake.  Underlying malignancy with SIADH cannot be ruled out.  Discharge Diagnoses:  Active Problems:   AMS (altered mental status)   Mass of lower lobe of lung   Failure to thrive (0-17)   DNR (do not resuscitate)   Protein-calorie malnutrition, severe   Poor fluid intake   Community acquired pneumonia   Encephalopathy   SOB (shortness of breath)   Hypoxia   Discharge Instructions  Discharge Instructions    Diet - low sodium heart healthy   Complete by: As directed    Increase activity slowly   Complete by: As directed      Allergies as of 12/25/2019   No Known Allergies     Medication List    STOP taking these medications   atorvastatin 10 MG tablet Commonly known as: LIPITOR   Eliquis 5 MG Tabs tablet Generic drug: apixaban   furosemide 20 MG tablet Commonly known as:  LASIX   potassium chloride 10 MEQ tablet Commonly known as: KLOR-CON   Tiadylt ER 300 MG 24 hr capsule Generic drug: diltiazem   vitamin B-12 1000 MCG tablet Commonly known as: CYANOCOBALAMIN   Vitamin D3 50 MCG (2000 UT) capsule     TAKE these medications   antiseptic oral rinse Liqd Apply 15 mLs topically as needed for  dry mouth.   feeding supplement (ENSURE ENLIVE) Liqd Take 237 mLs by mouth 3 (three) times daily between meals.   glycopyrrolate 0.2 MG/ML injection Commonly known as: ROBINUL Inject 1 mL (0.2 mg total) into the skin every 4 (four) hours as needed (excessive secretions).   haloperidol 0.5 MG tablet Commonly known as: HALDOL Take 1 tablet (0.5 mg total) by mouth every 4 (four) hours as needed for agitation (or delirium).   haloperidol 2 MG/ML solution Commonly known as: HALDOL Place 0.3 mLs (0.6 mg total) under the tongue every 4 (four) hours as needed for agitation (or delirium).   haloperidol lactate 5 MG/ML injection Commonly known as: HALDOL Inject 0.1 mLs (0.5 mg total) into the vein every 4 (four) hours as needed (or delirium).   LORazepam 2 MG/ML injection Commonly known as: ATIVAN Inject 0.25 mLs (0.5 mg total) into the vein once for 1 dose.   morphine 2 MG/ML injection Inject 0.5 mLs (1 mg total) into the vein every hour as needed (And air hunger).   polyvinyl alcohol 1.4 % ophthalmic solution Commonly known as: LIQUIFILM TEARS Place 1 drop into both eyes 4 (four) times daily as needed for dry eyes.   timolol 0.5 % ophthalmic solution Commonly known as: TIMOPTIC Place 1 drop into both eyes daily.   Xalatan 0.005 % ophthalmic solution Generic drug: latanoprost Place 1 drop into both eyes at bedtime.       No Known Allergies  Consultations:  Oncology  Pulmonology  Cardiology  Palliative care  Rheumatology  Procedures/Studies: DG Chest 1 View  Result Date: 12/20/2019 CLINICAL DATA:  Hypoxia. EXAM: CHEST  1 VIEW COMPARISON:  December 19, 2019 FINDINGS: Mild multifocal patchy infiltrates are seen throughout both lungs. Mild to moderate severity infiltrates are also noted within the bilateral lung bases. There is a very small right pleural effusion. A small to moderate size left pleural effusion is seen. No pneumothorax is identified. The cardiac silhouette  is markedly enlarged. There is mild calcification of the aortic arch. Degenerative changes seen involving the thoracic spine. IMPRESSION: 1. Mild multifocal patchy infiltrates throughout both lungs. 2. Mild to moderate severity bibasilar infiltrates. 3. Small to moderate size left pleural effusion. 4. Very small right pleural effusion. Electronically Signed   By: Virgina Norfolk M.D.   On: 12/20/2019 21:54   CT Head Wo Contrast  Result Date: 12/15/2019 CLINICAL DATA:  Altered mental status.  Recent fall at home. EXAM: CT HEAD WITHOUT CONTRAST TECHNIQUE: Contiguous axial images were obtained from the base of the skull through the vertex without intravenous contrast. COMPARISON:  12/04/2019 FINDINGS: Brain: Generalized atrophy. No focal finding affects the brainstem or cerebellum. Cerebral hemispheres show chronic small-vessel ischemic changes throughout the white matter. No cortical or large vessel territory infarction. No mass lesion, hemorrhage, hydrocephalus or extra-axial collection. Vascular: There is atherosclerotic calcification of the major vessels at the base of the brain. Skull: Negative Sinuses/Orbits: Clear/normal Other: None IMPRESSION: No acute or reversible finding. No intracranial hemorrhage. Age related atrophy. Chronic small-vessel ischemic changes of the cerebral hemispheric white matter. Electronically Signed   By: Jan Fireman.D.  On: 12/15/2019 13:28   CT HEAD WO CONTRAST  Result Date: 12/04/2019 CLINICAL DATA:  Confusion for 2 weeks. EXAM: CT HEAD WITHOUT CONTRAST TECHNIQUE: Contiguous axial images were obtained from the base of the skull through the vertex without intravenous contrast. COMPARISON:  None. FINDINGS: Brain: Age related atrophy. Moderate chronic small vessel ischemia. No intracranial hemorrhage, mass effect, or midline shift. No hydrocephalus. The basilar cisterns are patent. No evidence of territorial infarct or acute ischemia. No extra-axial or intracranial fluid  collection. Vascular: Atherosclerosis of skullbase vasculature without hyperdense vessel or abnormal calcification. Skull: No fracture or focal lesion. Sinuses/Orbits: Mucous retention cyst in the right maxillary sinus. Paranasal sinuses otherwise clear. Mastoid air cells well aerated. The visualized orbits are unremarkable. Bilateral lens extraction. Other: None. IMPRESSION: 1. No acute intracranial abnormality. 2. Age related atrophy and chronic small vessel ischemia. Electronically Signed   By: Keith Rake M.D.   On: 12/04/2019 17:32   CT Chest W Contrast  Result Date: 12/15/2019 CLINICAL DATA:  Persistent cough. EXAM: CT CHEST WITH CONTRAST TECHNIQUE: Multidetector CT imaging of the chest was performed during intravenous contrast administration. CONTRAST:  58m OMNIPAQUE IOHEXOL 300 MG/ML  SOLN COMPARISON:  September 18, 2017. FINDINGS: Cardiovascular: Atherosclerosis of thoracic aorta is noted without aneurysm or dissection. Large pericardial effusion is again noted as described on prior exam. Mediastinum/Nodes: 3.0 cm right thyroid nodule is noted which contains multiple calcifications and is unchanged compared to prior exam. The esophagus is unremarkable. No definite adenopathy is noted. Lungs/Pleura: Moderate and probably loculated left pleural effusion is again noted. Small right pleural effusion is also noted which does not appear to be significantly changed. No pneumothorax is noted. Multiple rounded ill-defined opacities are noted throughout both lungs, most prominently seen in both lower lobes. These most likely represent multifocal pneumonia, potentially of viral etiology, but metastatic disease cannot be excluded. Upper Abdomen: There is interval development of several rounded low densities within the liver. Musculoskeletal: No chest wall abnormality. No acute or significant osseous findings. IMPRESSION: 1. Multiple rounded ill-defined opacities are noted throughout both lungs, but most  prominently seen in the lung bases. These most likely represent multifocal pneumonia, potentially of viral etiology, but metastatic disease cannot be excluded. Further evaluation with unenhanced chest CT in 2-3 weeks is recommended to ensure resolution or stability. 2. Large pericardial effusion is again noted as described on prior exam. 3. Moderate and probably loculated left pleural effusion is noted which does not appear to be significantly changed. 4. Small right pleural effusion is also noted which does not appear to be significantly changed. 5. Interval development of several rounded low densities within the liver concerning for metastatic disease. MRI of the liver with and without gadolinium administration is recommended for further evaluation. 6. Stable 3.0 cm right thyroid nodule is noted which contains multiple calcifications and is unchanged compared to prior exam. In the setting of significant comorbidities or limited life expectancy, no follow-up recommended. (Ref: J Am Coll Radiol. 2015 Feb;12(2): 143-50). Aortic Atherosclerosis (ICD10-I70.0). Electronically Signed   By: JMarijo ConceptionM.D.   On: 12/15/2019 13:30   DG Chest Left Decubitus  Result Date: 12/19/2019 CLINICAL DATA:  Recurrent left pleural effusion. EXAM: CHEST - LEFT DECUBITUS COMPARISON:  Chest x-ray and chest CT dated 12/15/2019 and CT scan of the abdomen dated 12/16/2019 FINDINGS: Left lateral decubitus view does demonstrate lateral layering of the left pleural effusion. Cardiac silhouette remains enlarged consistent with a large pericardial effusion demonstrated on the prior CT scans.  Medial layering of the small right pleural effusion. IMPRESSION: 1. Lateral layering of the left pleural effusion. 2. Medial layering of a small right effusion. Electronically Signed   By: Francene Boyers M.D.   On: 12/19/2019 17:30   CT ABDOMEN PELVIS W CONTRAST  Result Date: 12/16/2019 CLINICAL DATA:  Fair to thrive, multiple liver lesions seen  on chest CT, concern for metastatic disease EXAM: CT ABDOMEN AND PELVIS WITH CONTRAST TECHNIQUE: Multidetector CT imaging of the abdomen and pelvis was performed using the standard protocol following bolus administration of intravenous contrast. CONTRAST:  67mL OMNIPAQUE IOHEXOL 300 MG/ML  SOLN COMPARISON:  12/15/2019 FINDINGS: Lower chest: Large pericardial effusion again noted unchanged. Bilateral pleural effusions are stable. Nodular consolidation at the lung bases consistent with suspected metastatic disease. Hepatobiliary: There are numerous hepatic masses, largest within segment 4 measuring 1.8 cm. Again, dedicated liver MRI recommended when clinically able for complete characterization. On this single phase examination, metastatic disease remains the diagnosis of exclusion. No intrahepatic duct dilation. Gallbladder is surgically absent. Pancreas: Unremarkable. No pancreatic ductal dilatation or surrounding inflammatory changes. Spleen: 8 mm hyperdensity within the posterior aspect of the spleen nonspecific, and may reflect small hemangioma. Otherwise the spleen is unremarkable. Adrenals/Urinary Tract: Bilateral adrenal thickening is nonspecific, metastatic disease not excluded given remaining findings. No change since prior study. There is bilateral renal cortical atrophy. No focal renal abnormalities. No urinary tract calculi or obstruction. Bladder is decompressed but otherwise unremarkable. Stomach/Bowel: No bowel obstruction or ileus. There is diffuse diverticulosis of the sigmoid colon without evidence of diverticulitis. No bowel wall thickening or inflammatory change. Vascular/Lymphatic: Aortic atherosclerosis. No enlarged abdominal or pelvic lymph nodes. Reproductive: Status post hysterectomy. No adnexal masses. Other: Small amount of free fluid within the abdomen and pelvis. No free intraperitoneal gas. No abdominal wall hernia. Musculoskeletal: No acute or destructive bony lesions. Reconstructed  images demonstrate no additional findings. IMPRESSION: 1. Numerous hepatic masses, largest within segment 4 measuring 1.8 cm. Again, dedicated liver MRI recommended when clinically able for complete characterization. On this single phase examination, metastatic disease remains the diagnosis of exclusion. 2. Nodular consolidation at the lung bases consistent with metastatic disease. 3. Large pericardial effusion and bilateral pleural effusions, stable. 4. Small amount of free fluid within the abdomen and pelvis. 5. Sigmoid diverticulosis without diverticulitis. 6. Aortic Atherosclerosis (ICD10-I70.0). Electronically Signed   By: Sharlet Salina M.D.   On: 12/16/2019 19:47   MR LIVER W WO CONTRAST  Result Date: 12/19/2019 CLINICAL DATA:  Liver lesions on CT. EXAM: MRI ABDOMEN WITHOUT AND WITH CONTRAST TECHNIQUE: Multiplanar multisequence MR imaging of the abdomen was performed both before and after the administration of intravenous contrast. CONTRAST:  3mL GADAVIST GADOBUTROL 1 MMOL/ML IV SOLN COMPARISON:  Abdominal CT 12/16/2019. FINDINGS: Moderate degradation, including extensive respiratory motion, patient arm position, over the anterior abdominal wall. Lower chest: Large pericardial effusion with moderate bilateral pleural effusions. Bibasilar pulmonary nodularity, suboptimally evaluated. Hepatobiliary: No convincing evidence of cirrhosis. Multiple mildly T2 hyperintense, arterially and less so portal venous phase hyperenhancing liver lesions, highly suspicious for metastatic disease. Example in segment 4A at 2.1 cm on 32/12. Segment 7 at 2.1 cm on 30/12. More vague, in segment 8 at 1.0 cm on 30/12. Cholecystectomy, without biliary ductal dilatation. Pancreas: Grossly normal pancreas, without duct dilatation or dominant mass. Spleen:  Normal in size, without focal abnormality. Adrenals/Urinary Tract: Bilateral mild adrenal nodularity. Upper pole right renal too small to characterize lesion. Normal left kidney.  No hydronephrosis. Stomach/Bowel: Grossly normal  stomach and abdominal bowel loops. Vascular/Lymphatic: Aortic atherosclerosis. No abdominal adenopathy. Other:  Small volume perihepatic and perisplenic ascites. Musculoskeletal: No acute osseous abnormality. IMPRESSION: 1. Moderately motion and patient position degraded exam. 2. Multiple liver lesions, again suspicious for metastatic disease. 3. No primary malignancy identified within the abdomen. 4. Pleural and pericardial fluid with small volume abdominal ascites, as before. Electronically Signed   By: Abigail Miyamoto M.D.   On: 12/19/2019 14:39   US Venous Img Upper Uni Right(DVT)  Result Date: 12/21/2019 CLINICAL DATA:  Right upper extremity edema. EXAM: RIGHT UPPER EXTREMITY VENOUS DOPPLER ULTRASOUND TECHNIQUE: Gray-scale sonography with graded compression, as well as color Doppler and duplex ultrasound were performed to evaluate the upper extremity deep venous system from the level of the subclavian vein and including the jugular, axillary, basilic, radial, ulnar and upper cephalic vein. Spectral Doppler was utilized to evaluate flow at rest and with distal augmentation maneuvers. COMPARISON:  None. FINDINGS: Contralateral Subclavian Vein: Respiratory phasicity is normal and symmetric with the symptomatic side. No evidence of thrombus. Normal compressibility. Internal Jugular Vein: No evidence of thrombus. Normal compressibility, respiratory phasicity and response to augmentation. Subclavian Vein: No evidence of thrombus. Normal compressibility, respiratory phasicity and response to augmentation. Axillary Vein: No evidence of thrombus. Normal compressibility, respiratory phasicity and response to augmentation. Cephalic Vein: No evidence of thrombus. Normal compressibility, respiratory phasicity and response to augmentation. Basilic Vein: No evidence of thrombus. Normal compressibility, respiratory phasicity and response to augmentation. Brachial Veins: No  evidence of thrombus. Normal compressibility, respiratory phasicity and response to augmentation. Radial Veins: No evidence of thrombus. Normal compressibility, respiratory phasicity and response to augmentation. Ulnar Veins: No evidence of thrombus. Normal compressibility, respiratory phasicity and response to augmentation. Venous Reflux:  None visualized. Other Findings:  None visualized. IMPRESSION: No evidence of DVT within the right upper extremity. Electronically Signed   By: Marin Olp M.D.   On: 12/21/2019 08:49   DG Chest Port 1 View  Result Date: 12/22/2019 CLINICAL DATA:  S/p thoracentesis on the left EXAM: PORTABLE CHEST - 1 VIEW COMPARISON:  12/20/2019 FINDINGS: Significant resolution of left effusion. No pneumothorax. Improved aeration at the left lung base. Persistent airspace opacity medially in the left upper lung. Persistent small right pleural effusion with patchy airspace opacities medially at the right lung base. Persistent cardiomegaly.  Aortic Atherosclerosis (ICD10-170.0). Visualized bones unremarkable. IMPRESSION: 1. No pneumothorax post left thoracentesis. 2. Improved aeration at the left lung base. Electronically Signed   By: Lucrezia Europe M.D.   On: 12/22/2019 16:37   DG Chest Port 1 View  Result Date: 12/15/2019 CLINICAL DATA:  Altered mental status. Weakness. Recent fall. Pericardial effusion. EXAM: PORTABLE CHEST 1 VIEW COMPARISON:  07/08/2018, and chest CT on 09/18/2017 FINDINGS: Marked cardiac enlargement again seen, consistent with large pericardial effusion seen on prior CT. Aortic atherosclerosis noted. Small left pleural effusion or pleural thickening remains stable. Several ill-defined nodular opacities are seen in the left lung which are new since previous study. Pulmonary metastases cannot be excluded. Right lung is clear. IMPRESSION: 1. New ill-defined nodular opacities in left lung. Pulmonary metastases cannot be excluded. Consider further evaluation with chest CT  with contrast. 2. Stable marked cardiac enlargement, consistent with large pericardial effusion. 3. Stable small left pleural effusion versus pleural thickening. Electronically Signed   By: Marlaine Hind M.D.   On: 12/15/2019 12:11   ECHOCARDIOGRAM COMPLETE  Result Date: 12/21/2019    ECHOCARDIOGRAM REPORT   Patient Name:   Autumn Bradley  Date of Exam: 12/21/2019 Medical Rec #:  [id number]      Height:       66.0 in Accession #:    [id number]     Weight:       143.7 lb Date of Birth:  1932/01/13       BSA:          1.738 m Patient Age:    66 years       BP:           90/63 mmHg Patient Gender: F              HR:           67 bpm. Exam Location:  [facility] Procedure: 2D Echo STAT ECHO Indications:     R/O Cardiac Tamponade  History:         Patient has prior history of Echocardiogram examinations.                  Pericardial Effusion, CA, Arrythmias:Atrial Fibrillation; Risk                  Factors:Hypertension.  Sonographer:     L Thornton-Maynard Referring Phys:  [id number] Matheau [place] Diagnosing Phys: Isaias Cowman MD IMPRESSIONS  1. Left ventricular ejection fraction, by estimation, is 25 to 30%. The left ventricle has severely decreased function. The left ventricle has no regional wall motion abnormalities. There is moderate left ventricular hypertrophy. Left ventricular diastolic parameters are indeterminate.  2. Right ventricular systolic function is normal. The right ventricular size is normal. There is normal pulmonary artery systolic pressure.  3. Large pericardial effusion. The pericardial effusion is circumferential. There is no evidence of cardiac tamponade. Moderate pleural effusion in the left lateral region.  4. The mitral valve is normal in structure. Moderate to severe mitral valve regurgitation. No evidence of mitral stenosis.  5. Tricuspid valve regurgitation is mild to moderate.  6. The aortic valve is normal in structure. Aortic valve regurgitation is not visualized. No aortic stenosis is  present.  7. The inferior vena cava is normal in size with greater than 50% respiratory variability, suggesting right atrial pressure of 3 mmHg. FINDINGS  Left Ventricle: Left ventricular ejection fraction, by estimation, is 25 to 30%. The left ventricle has severely decreased function. The left ventricle has no regional wall motion abnormalities. The left ventricular internal cavity size was normal in size. There is moderate left ventricular hypertrophy. Left ventricular diastolic parameters are indeterminate. Right Ventricle: The right ventricular size is normal. No increase in right ventricular wall thickness. Right ventricular systolic function is normal. There is normal pulmonary artery systolic pressure. The tricuspid regurgitant velocity is 2.40 m/s, and  with an assumed right atrial pressure of 10 mmHg, the estimated right ventricular systolic pressure is 28.7 mmHg. Left Atrium: Left atrial size was normal in size. Right Atrium: Right atrial size was normal in size. Pericardium: A large pericardial effusion is present. The pericardial effusion is circumferential. There is no evidence of cardiac tamponade. Mitral Valve: The mitral valve is normal in structure. Normal mobility of the mitral valve leaflets. Moderate to severe mitral valve regurgitation. No evidence of mitral valve stenosis. MV peak gradient, 3.4 mmHg. The mean mitral valve gradient is 2.0 mmHg. Tricuspid Valve: The tricuspid valve is normal in structure. Tricuspid valve regurgitation is mild to moderate. No evidence of tricuspid stenosis. Aortic Valve: The aortic valve is normal in structure. Aortic valve regurgitation is not visualized. No aortic stenosis is present.  Aortic valve mean gradient measures 2.0 mmHg. Aortic valve peak gradient measures 3.1 mmHg. Aortic valve area, by VTI measures 2.10 cm. Pulmonic Valve: The pulmonic valve was normal in structure. Pulmonic valve regurgitation is not visualized. No evidence of pulmonic stenosis.  Aorta: The aortic root is normal in size and structure. Venous: The inferior vena cava is normal in size with greater than 50% respiratory variability, suggesting right atrial pressure of 3 mmHg. IAS/Shunts: No atrial level shunt detected by color flow Doppler. Additional Comments: There is a moderate pleural effusion in the left lateral region.  LEFT VENTRICLE PLAX 2D LVIDd:         3.64 cm  Diastology LVIDs:         3.19 cm  LV e' lateral:   3.94 cm/s LV PW:         1.53 cm  LV E/e' lateral: 21.6 LV IVS:        1.92 cm  LV e' medial:    3.62 cm/s LVOT diam:     2.30 cm  LV E/e' medial:  23.6 LV SV:         32 LV SV Index:   18 LVOT Area:     4.15 cm  RIGHT VENTRICLE RV S prime:     6.62 cm/s TAPSE (M-mode): 1.7 cm LEFT ATRIUM             Index       RIGHT ATRIUM           Index LA diam:        3.90 cm 2.24 cm/m  RA Area:     21.40 cm LA Vol (A2C):   75.0 ml 43.16 ml/m RA Volume:   63.70 ml  36.65 ml/m LA Vol (A4C):   57.8 ml 33.26 ml/m LA Biplane Vol: 66.7 ml 38.38 ml/m  AORTIC VALVE AV Area (Vmax):    2.10 cm AV Area (Vmean):   2.01 cm AV Area (VTI):     2.10 cm AV Vmax:           87.90 cm/s AV Vmean:          68.100 cm/s AV VTI:            0.151 m AV Peak Grad:      3.1 mmHg AV Mean Grad:      2.0 mmHg LVOT Vmax:         44.40 cm/s LVOT Vmean:        33.000 cm/s LVOT VTI:          0.076 m LVOT/AV VTI ratio: 0.51  AORTA Ao Root diam: 3.20 cm MITRAL VALVE                 TRICUSPID VALVE MV Area (PHT): 3.42 cm      TR Peak grad:   23.0 mmHg MV Peak grad:  3.4 mmHg      TR Vmax:        240.00 cm/s MV Mean grad:  2.0 mmHg MV Vmax:       0.93 m/s      SHUNTS MV Vmean:      61.9 cm/s     Systemic VTI:  0.08 m MV Decel Time: 222 msec      Systemic Diam: 2.30 cm MR Peak grad:    59.6 mmHg MR Mean grad:    36.0 mmHg MR Vmax:         386.00 cm/s MR Vmean:  284.0 cm/s MR PISA:         2.26 cm MR PISA Eff ROA: 27 mm MR PISA Radius:  0.60 cm MV E velocity: 85.30 cm/s Isaias Cowman MD Electronically  signed by Isaias Cowman MD Signature Date/Time: 12/21/2019/10:54:40 AM    Final    US THORACENTESIS ASP PLEURAL SPACE W/IMG GUIDE  Result Date: 12/22/2019 INDICATION: Hypoxia, pleural effusions left greater than right EXAM: ULTRASOUND GUIDED LEFT THORACENTESIS MEDICATIONS: Lidocaine 1% subcutaneous COMPLICATIONS: None immediate. PROCEDURE: An ultrasound guided thoracentesis was thoroughly discussed with the patient and questions answered. The benefits, risks, alternatives and complications were also discussed. The patient understands and wishes to proceed with the procedure. Written consent was obtained. Ultrasound was performed to localize and mark an adequate pocket of fluid in the left chest. The area was then prepped and draped in the normal sterile fashion. 1% Lidocaine was used for local anesthesia. Under ultrasound guidance a 6 Fr Safe-T-Centesis catheter was introduced. Thoracentesis was performed. The catheter was removed and a dressing applied. FINDINGS: A total of approximately 900 mL of amber fluid was removed. IMPRESSION: Successful ultrasound guided left thoracentesis yielding 900 mL of pleural fluid. No pneumothorax on follow-up radiograph. Electronically Signed   By: Lucrezia Europe M.D.   On: 12/22/2019 16:34     Subjective: Patient has no complaint today.  She was lying comfortably in bed.  Daughter-in-law was at bedside.  Discharge Exam: Vitals:   12/25/19 0815 12/25/19 0846  BP:  97/67  Pulse: 100 (!) 112  Resp:  20  Temp:  98.5 F (36.9 C)  SpO2:  95%   Vitals:   12/25/19 0339 12/25/19 0356 12/25/19 0815 12/25/19 0846  BP: 95/77   97/67  Pulse: (!) 104 98 100 (!) 112  Resp:    20  Temp:    98.5 F (36.9 C)  TempSrc:    Oral  SpO2: 95%   95%  Weight:      Height:        General: Pt is alert, awake, not in acute distress Cardiovascular: RRR, S1/S2 +, no rubs, no gallops Respiratory: CTA bilaterally, no wheezing, no rhonchi Abdominal: Soft, NT, ND, bowel sounds  + Extremities: no edema, no cyanosis   The results of significant diagnostics from this hospitalization (including imaging, microbiology, ancillary and laboratory) are listed below for reference.    Microbiology: Recent Results (from the past 240 hour(s))  Urine culture     Status: None   Collection Time: 12/15/19 11:53 AM   Specimen: Urine, Random  Result Value Ref Range Status   Specimen Description   Final    URINE, RANDOM Performed at [facility], [address] [place], [place] [address]    Special Requests   Final    NONE Performed at [facility], [address] [place], [place] [address]    Culture   Final    NO GROWTH Performed at [facility], [place] [address], [place], [place] [address]    Report Status 12/16/2019 FINAL  Final  SARS Coronavirus 2 by RT PCR (hospital order, performed in [facility] hospital lab) Nasopharyngeal Nasopharyngeal Swab     Status: None   Collection Time: 12/15/19  2:38 PM   Specimen: Nasopharyngeal Swab  Result Value Ref Range Status   SARS Coronavirus 2 NEGATIVE NEGATIVE Final    Comment: (NOTE) SARS-CoV-2 target nucleic acids are NOT DETECTED.  The SARS-CoV-2 RNA is generally detectable in upper and lower respiratory specimens during the  acute phase of infection. The lowest concentration of SARS-CoV-2 viral copies this assay can detect is 250 copies / mL. A negative result does not preclude SARS-CoV-2 infection and should not be used as the sole basis for treatment or other patient management decisions.  A negative result may occur with improper specimen collection / handling, submission of specimen other than nasopharyngeal swab, presence of viral mutation(s) within the areas targeted by this assay, and inadequate number of viral copies (<250 copies / mL). A negative result must be combined with clinical observations, patient history, and epidemiological information.  Fact Sheet for Patients:    StrictlyIdeas.no  Fact Sheet for Healthcare Providers: BankingDealers.co.za  This test is not yet approved or  cleared by the [place] FDA and has been authorized for detection and/or diagnosis of SARS-CoV-2 by FDA under an Emergency Use Authorization (EUA).  This EUA will remain in effect (meaning this test can be used) for the duration of the COVID-19 declaration under Section 564(b)(1) of the Act, 21 U.S.C. section 360bbb-3(b)(1), unless the authorization is terminated or revoked sooner.  Performed at [facility], [address] [place], [place] [address]   Blood culture (routine x 2)     Status: None   Collection Time: 12/15/19  2:38 PM   Specimen: BLOOD  Result Value Ref Range Status   Specimen Description BLOOD LEFT ANTECUBITAL  Final   Special Requests   Final    BOTTLES DRAWN AEROBIC AND ANAEROBIC Blood Culture adequate volume   Culture   Final    NO GROWTH 5 DAYS Performed at [facility], [address], [place], [place] [address]    Report Status 12/20/2019 FINAL  Final  Blood culture (routine x 2)     Status: None   Collection Time: 12/15/19  2:38 PM   Specimen: BLOOD  Result Value Ref Range Status   Specimen Description BLOOD BLOOD RIGHT ARM  Final   Special Requests   Final    BOTTLES DRAWN AEROBIC AND ANAEROBIC Blood Culture adequate volume   Culture   Final    NO GROWTH 5 DAYS Performed at [facility], [address], [place], [place] [address]    Report Status 12/20/2019 FINAL  Final  Body fluid culture     Status: None (Preliminary result)   Collection Time: 12/22/19  4:00 PM   Specimen: PATH Cytology Pleural fluid  Result Value Ref Range Status   Specimen Description FLUID PLEURAL  Final   Special Requests NONE  Final   Gram Stain   Final    RARE WBC PRESENT, PREDOMINANTLY MONONUCLEAR NO ORGANISMS SEEN    Culture   Final    NO GROWTH 3 DAYS Performed at [facility], [place] [address] [place], [place] [address]    Report Status PENDING  Incomplete     Labs: BNP (last 3 results) Recent Labs    12/15/19 1153 12/20/19 2111  BNP 781.6* 0,086.7*   Basic Metabolic Panel: Recent Labs  Lab 12/19/19 0338 12/21/19 0625  NA 128* 126*  K 4.2 4.3  CL 93* 91*  CO2 28 23  GLUCOSE 110* 127*  BUN 25* 24*  CREATININE 0.84 0.81  CALCIUM 8.4* 8.8*  PHOS 2.5  --    Liver Function Tests: Recent Labs  Lab 12/19/19 0338  ALBUMIN 2.7*   No results for input(s): LIPASE, AMYLASE in the last 168 hours. No results for input(s): AMMONIA in the last 168 hours. CBC: Recent Labs  Lab 12/20/19 2111 12/21/19 0625  WBC 4.4 3.0*  HGB 16.0* 13.6  HCT 46.4* 39.9  MCV 88.0 88.3  PLT 99* 87*   Cardiac Enzymes: No results for input(s): CKTOTAL, CKMB, CKMBINDEX, TROPONINI in the last 168 hours. BNP: Invalid input(s): POCBNP CBG: No results for input(s): GLUCAP in the last 168 hours. D-Dimer No results for input(s): DDIMER in the last 72 hours. Hgb A1c No results for input(s): HGBA1C in the last 72 hours. Lipid Profile No results for input(s): CHOL, HDL, LDLCALC, TRIG, CHOLHDL, LDLDIRECT in the last 72 hours. Thyroid function studies No results for input(s): TSH, T4TOTAL, T3FREE, THYROIDAB in the last 72 hours.  Invalid input(s): FREET3 Anemia work up No results for input(s): VITAMINB12, FOLATE, FERRITIN, TIBC, IRON, RETICCTPCT in the last 72 hours. Urinalysis    Component Value Date/Time   COLORURINE AMBER (A) 12/15/2019 1153   APPEARANCEUR TURBID (A) 12/15/2019 1153   LABSPEC 1.026 12/15/2019 1153   PHURINE 5.0 12/15/2019 1153   GLUCOSEU NEGATIVE 12/15/2019 1153   HGBUR SMALL (A) 12/15/2019 1153   BILIRUBINUR NEGATIVE 12/15/2019 1153   KETONESUR NEGATIVE 12/15/2019 1153   PROTEINUR >=300 (A) 12/15/2019 1153   NITRITE NEGATIVE 12/15/2019 1153   LEUKOCYTESUR NEGATIVE 12/15/2019 1153   Sepsis Labs Invalid input(s): PROCALCITONIN,   WBC,  LACTICIDVEN Microbiology Recent Results (from the past 240 hour(s))  Urine culture     Status: None   Collection Time: 12/15/19 11:53 AM   Specimen: Urine, Random  Result Value Ref Range Status   Specimen Description   Final    URINE, RANDOM Performed at [facility], [address], [place], [place] [address]    Special Requests   Final    NONE Performed at [facility], [address] [place], [place] [address]    Culture   Final    NO GROWTH Performed at [facility], [address], [place], [place] [address]    Report Status 12/16/2019 FINAL  Final  SARS Coronavirus 2 by RT PCR (hospital order, performed in [facility] hospital lab) Nasopharyngeal Nasopharyngeal Swab     Status: None   Collection Time: 12/15/19  2:38 PM   Specimen: Nasopharyngeal Swab  Result Value Ref Range Status   SARS Coronavirus 2 NEGATIVE NEGATIVE Final    Comment: (NOTE) SARS-CoV-2 target nucleic acids are NOT DETECTED.  The SARS-CoV-2 RNA is generally detectable in upper and lower respiratory specimens during the acute phase of infection. The lowest concentration of SARS-CoV-2 viral copies this assay can detect is 250 copies / mL. A negative result does not preclude SARS-CoV-2 infection and should not be used as the sole basis for treatment or other patient management decisions.  A negative result may occur with improper specimen collection / handling, submission of specimen other than nasopharyngeal swab, presence of viral mutation(s) within the areas targeted by this assay, and inadequate number of viral copies (<250 copies / mL). A negative result must be combined with clinical observations, patient history, and epidemiological information.  Fact Sheet for Patients:   BoilerBrush.com.cy  Fact Sheet for Healthcare Providers: https://pope.com/  This test is not yet approved or  cleared by the [place] FDA  and has been authorized for detection and/or diagnosis of SARS-CoV-2 by FDA under an Emergency Use Authorization (EUA).  This EUA will remain in effect (meaning this test can be used) for the duration of the COVID-19 declaration under Section 564(b)(1) of the Act, 21 U.S.C. section 360bbb-3(b)(1), unless the authorization is  terminated or revoked sooner.  Performed at [facility], [address] [place], [place] [address]   Blood culture (routine x 2)     Status: None   Collection Time: 12/15/19  2:38 PM   Specimen: BLOOD  Result Value Ref Range Status   Specimen Description BLOOD LEFT ANTECUBITAL  Final   Special Requests   Final    BOTTLES DRAWN AEROBIC AND ANAEROBIC Blood Culture adequate volume   Culture   Final    NO GROWTH 5 DAYS Performed at [facility], [address] [place], [place] [address]    Report Status 12/20/2019 FINAL  Final  Blood culture (routine x 2)     Status: None   Collection Time: 12/15/19  2:38 PM   Specimen: BLOOD  Result Value Ref Range Status   Specimen Description BLOOD BLOOD RIGHT ARM  Final   Special Requests   Final    BOTTLES DRAWN AEROBIC AND ANAEROBIC Blood Culture adequate volume   Culture   Final    NO GROWTH 5 DAYS Performed at [facility], [address] [place], [place] [address]    Report Status 12/20/2019 FINAL  Final  Body fluid culture     Status: None (Preliminary result)   Collection Time: 12/22/19  4:00 PM   Specimen: PATH Cytology Pleural fluid  Result Value Ref Range Status   Specimen Description FLUID PLEURAL  Final   Special Requests NONE  Final   Gram Stain   Final    RARE WBC PRESENT, PREDOMINANTLY MONONUCLEAR NO ORGANISMS SEEN    Culture   Final    NO GROWTH 3 DAYS Performed at [facility], [place] [address], [place], [place] [address]    Report Status PENDING  Incomplete    Time coordinating discharge: Over 30 minutes  SIGNED:  Lorella Nimrod, MD  [facility] 12/25/2019, 10:32 AM  If 7PM-7AM, please contact night-coverage [email or web address]  This record has been created using Systems analyst. Errors have been sought and corrected,but may not always be located. Such creation errors do not reflect on the standard of care.

## 2019-12-25 NOTE — Progress Notes (Signed)
Daily Progress Note   Patient Name: Autumn Bradley       Date: 12/25/2019 DOB: 1932/06/11  Age: 86 y.o. MRN#: [id number] Attending Physician: Lorella Nimrod, MD Primary Care Physician: Adin Hector, MD Admit Date: 12/15/2019  Reason for Consultation/Follow-up: Establishing goals of care  Subjective: Patient is resting in bed. She denies complaint. Breakfast is at bedside and patient states she is not hungry. She requests an ice chip. Her urine in purewick container is dark brown.    Family is at bedside, all questions answered. Awaiting transfer to hospice facility.     Length of Stay: 10  Current Medications: Scheduled Meds:  . feeding supplement (ENSURE ENLIVE)  237 mL Oral TID BM  . latanoprost  1 drop Both Eyes QHS  . LORazepam  0.5 mg Intravenous Once  . polyethylene glycol  17 g Oral Daily  . timolol  1 drop Both Eyes Daily    Continuous Infusions:   PRN Meds: acetaminophen **OR** acetaminophen, antiseptic oral rinse, glycopyrrolate **OR** glycopyrrolate **OR** glycopyrrolate, haloperidol **OR** haloperidol **OR** haloperidol lactate, levalbuterol, morphine injection, ondansetron **OR** ondansetron (ZOFRAN) IV, polyvinyl alcohol  Physical Exam Pulmonary:     Effort: Pulmonary effort is normal.  Neurological:     Mental Status: She is alert.     Comments: Oriented. Less conversive today.              Vital Signs: BP 97/67 (BP Location: Left Arm)   Pulse (!) 112   Temp 98.5 F (36.9 C) (Oral)   Resp 20   Ht 5\' 6"  (1.676 m)   Wt 65.2 kg   SpO2 95%   BMI 23.20 kg/m  SpO2: SpO2: 95 % O2 Device: O2 Device: Room Air O2 Flow Rate: O2 Flow Rate (L/min): 2 L/min  Intake/output summary:   Intake/Output Summary (Last 24 hours) at 12/25/2019 1045 Last data filed at  12/24/2019 1650 Gross per 24 hour  Intake 120 ml  Output --  Net 120 ml   LBM: Last BM Date:  (Last BM unknown) Baseline Weight: Weight: 56.7 kg Most recent weight: Weight: 65.2 kg       Palliative Assessment/Data: 20%    Flowsheet Rows     Most Recent Value  Intake Tab  Referral Department Hospitalist  Unit at Time of Referral Med/Surg Unit  Palliative Care Primary Diagnosis Cancer  Date Notified 12/16/19  Palliative Care Type Return patient Palliative Care  Reason for referral Non-pain Symptom  Date of Admission 12/15/19  Date first seen by Palliative Care 12/16/19  # of days Palliative referral response time 0 Day(s)  # of days IP prior to Palliative referral 1  Clinical Assessment  Psychosocial & Spiritual Assessment  Palliative Care Outcomes      Patient Active Problem List   Diagnosis Date Noted  . Community acquired pneumonia   . Encephalopathy   . SOB (shortness of breath)   . Hypoxia   . Poor fluid intake   . Protein-calorie malnutrition, severe 12/16/2019  . Mass of lower lobe of lung   . Failure to thrive (0-17)   . DNR (do not resuscitate)   . AMS (altered mental status) 12/15/2019  . Recurrent left pleural effusion   . Goals of care, counseling/discussion   . Palliative care by specialist   . CHF (congestive heart failure) ([place]) 07/07/2018    Palliative Care Assessment & Plan    Recommendations/Plan: No changes to medications as patient denies complaint. Awaiting bed at hospice facility.     Code Status:    Code Status Orders  (From admission, onward)         Start     Ordered   12/21/19 1351  Do not attempt resuscitation (DNR)  Continuous       Question Answer Comment  In the event of cardiac or respiratory ARREST Do not call a "code blue"   In the event of cardiac or respiratory ARREST Do not perform Intubation, CPR, defibrillation or ACLS   In the event of cardiac or respiratory ARREST Use medication by any route, position, wound  care, and other measures to relive pain and suffering. May use oxygen, suction and manual treatment of airway obstruction as needed for comfort.      12/21/19 1353        Code Status History    Date Active Date Inactive Code Status Order ID Comments User Context   12/15/2019 1645 12/21/2019 1353 DNR [id number]  Enzo Bi, MD Inpatient   07/07/2018 1014 07/11/2018 1736 DNR [id number]  Hillary Bow, MD ED   07/07/2018 0907 07/07/2018 1014 Full Code [id number]  Hillary Bow, MD ED   Advance Care Planning Activity       Prognosis:   Unable to determine   Thank you for allowing the Palliative Medicine Team to assist in the care of this patient.   Total Time 35 min Prolonged Time Billed no      Greater than 50%  of this time was spent counseling and coordinating care related to the above assessment and plan.  Asencion Gowda, NP  Please contact Palliative Medicine Team phone at [phone] for questions and concerns.

## 2019-12-25 NOTE — Progress Notes (Signed)
[facility] hospital liaison note: EMS notified for transport, family and hospital care team notified. Thank you. Flo Shanks BSN, RN, [place] [phone]

## 2019-12-25 NOTE — TOC Transition Note (Signed)
Transition of Care [facility]) - CM/SW Discharge Note   Patient Details  Name: Autumn Bradley MRN: [id number] Date of Birth: 21-Nov-1931  [facility] CM/SW Contact:  Elease Hashimoto, LCSW Phone Number: 12/25/2019, 9:46 AM   Clinical Narrative:   Hospice has a bed for pt today and she can transfer there. Family and pt made aware and plan for her to go by EMS. Pt and family at peace with this decision and following pt's wishes. DC packet in chart and [facility] aware of this    Final next level of care: [place] Barriers to Discharge: Barriers Resolved   Patient Goals and CMS Choice Patient states their goals for this hospitalization and ongoing recovery are:: I hope to find out what is wrong with me CMS Medicare.gov Compare Post Acute Care list provided to:: Patient Choice offered to / list presented to : Patient, [facility] POA / [place]  Discharge Placement              Patient chooses bed at: Other - please specify in the comment section below: [facility]) Patient to be transferred to facility by: EMS Name of family member notified: [name]-daughter in-law Patient and family notified of of transfer: 12/25/19  Discharge Plan and Services In-house Referral: Clinical Social Work                                   Social Determinants of Health (SDOH) Interventions     Readmission Risk Interventions No flowsheet data found.

## 2019-12-25 NOTE — Progress Notes (Signed)
[facility] hospital liaison note:  Follow up visit made to new referral for [facility]ice home. Hospice eligibility confirmed and bed is available for transfer today. Writer spoke to patient's family, daughter Juliann Pulse and daughter in law Coralyn Mark in the room and daughter in law [place] via telephone. Arrangements made for consents to be signed at the hospice home today at 11 am. Writer to call report and arrange EMS pick up when consents are completed. Family and Hospital care team aware. Signed out of facility DNR in place in d/c packet. Thank you for the opportunity to be involved in the care of this patient and her family.  Flo Shanks BSN, RN, [place] [phone]

## 2019-12-25 NOTE — Care Management Important Message (Signed)
Important Message  Patient Details  Name: Autumn Bradley MRN: [id number] Date of Birth: 07-Oct-1931   Medicare Important Message Given:  Yes     Juliann Pulse A Sherley Leser 12/25/2019, 10:56 AM

## 2019-12-26 LAB — BODY FLUID CULTURE: Culture: NO GROWTH

## 2019-12-26 LAB — ACID FAST SMEAR (AFB, MYCOBACTERIA): Acid Fast Smear: NEGATIVE

## 2019-12-26 LAB — PROTEIN, BODY FLUID (OTHER): Total Protein, Body Fluid Other: 1.7 g/dL

## 2020-01-01 DEATH — deceased

## 2020-01-22 LAB — FUNGUS CULTURE WITH STAIN

## 2020-01-22 LAB — FUNGUS CULTURE RESULT

## 2020-01-22 LAB — FUNGAL ORGANISM REFLEX

## 2020-02-09 LAB — ACID FAST CULTURE WITH REFLEXED SENSITIVITIES (MYCOBACTERIA): Acid Fast Culture: NEGATIVE

## 2020-03-01 MED FILL — Enoxaparin Sodium Inj 30 MG/0.3ML: SUBCUTANEOUS | Qty: 0.3 | Status: AC

## 2020-03-01 MED FILL — Diltiazem HCl Coated Beads Cap ER 24HR 300 MG: ORAL | Qty: 300 | Status: AC
